# Patient Record
Sex: Female | Born: 1990 | Hispanic: Yes | Marital: Single | Smoking: Never smoker
Health system: Southern US, Community
[De-identification: ages and names within clinical notes are randomized; demographics above are authoritative.]

## PROBLEM LIST (undated history)

## (undated) DIAGNOSIS — B9689 Other specified bacterial agents as the cause of diseases classified elsewhere: Secondary | ICD-10-CM

## (undated) DIAGNOSIS — N760 Acute vaginitis: Secondary | ICD-10-CM

## (undated) DIAGNOSIS — R112 Nausea with vomiting, unspecified: Secondary | ICD-10-CM

## (undated) DIAGNOSIS — N390 Urinary tract infection, site not specified: Secondary | ICD-10-CM

## (undated) DIAGNOSIS — Z789 Other specified health status: Secondary | ICD-10-CM

## (undated) DIAGNOSIS — B3731 Acute candidiasis of vulva and vagina: Secondary | ICD-10-CM

## (undated) DIAGNOSIS — B373 Candidiasis of vulva and vagina: Secondary | ICD-10-CM

## (undated) DIAGNOSIS — Z9889 Other specified postprocedural states: Secondary | ICD-10-CM

## (undated) HISTORY — DX: Other specified postprocedural states: Z98.890

## (undated) HISTORY — DX: Candidiasis of vulva and vagina: B37.3

## (undated) HISTORY — DX: Other specified bacterial agents as the cause of diseases classified elsewhere: N76.0

## (undated) HISTORY — PX: OTHER SURGICAL HISTORY: SHX169

## (undated) HISTORY — DX: Acute candidiasis of vulva and vagina: B37.31

## (undated) HISTORY — DX: Other specified bacterial agents as the cause of diseases classified elsewhere: B96.89

## (undated) HISTORY — DX: Other specified postprocedural states: R11.2

## (undated) HISTORY — DX: Urinary tract infection, site not specified: N39.0

## (undated) HISTORY — DX: Other specified health status: Z78.9

## (undated) HISTORY — PX: NO PAST SURGERIES: SHX2092

---

## 2019-06-24 NOTE — L&D Delivery Note (Addendum)
Patient is 29 y.o. G2P1001 [redacted]w[redacted]d admitted for elective IOL.   Delivery Note At 7:52 PM a viable female was delivered via Vaginal, Spontaneous (Presentation: Right Occiput Anterior).  APGAR: 8, 8; weight  .   Placenta status: Spontaneous, Intact.  Cord: 3 vessels  Anesthesia: Local Episiotomy: None Lacerations: Perineal;2nd degree Suture Repair: 3.0 vicryl Est. Blood Loss (mL): 225  Mom to postpartum.  Baby to Couplet care / Skin to Skin.  Upon arrival patient was complete and pushing. She pushed with good maternal effort to deliver a healthy baby girl. Baby delivered without difficulty, was noted to have good tone and place on maternal abdomen for oral suctioning, drying and stimulation. Delayed cord clamping performed. Placenta delivered intact with 3V cord. Vaginal canal and perineum was inspected and found to have a hemostatic periurethral laceration and a small second degree perineal laceration that was repaired with a figure of 8 with a 3-0 vycryl. Pitocin was started and uterus massaged until bleeding slowed. Counts of sharps, instruments, and lap pads were all correct.     Mirian Mo, MD PGY-3 8/19/20218:26 PM  I was gloved and present for the delivery of baby and the placenta. I also was present for Dr. Cardell Peach repair of 2nd degree laceration.  Lynnda Shields, MD OB Fellow, Faculty [facility].

## 2019-07-31 ENCOUNTER — Other Ambulatory Visit: Payer: Self-pay

## 2019-07-31 ENCOUNTER — Emergency Department (HOSPITAL_COMMUNITY)
Admission: EM | Admit: 2019-07-31 | Discharge: 2019-07-31 | Disposition: A | Discharge status: Home / Self Care | Payer: Medicaid Other | Attending: Emergency Medicine | Admitting: Emergency Medicine

## 2019-07-31 ENCOUNTER — Encounter (HOSPITAL_COMMUNITY): Payer: Self-pay | Admitting: Emergency Medicine

## 2019-07-31 DIAGNOSIS — O219 Vomiting of pregnancy, unspecified: Secondary | ICD-10-CM | POA: Diagnosis not present

## 2019-07-31 DIAGNOSIS — Z3A00 Weeks of gestation of pregnancy not specified: Secondary | ICD-10-CM | POA: Diagnosis not present

## 2019-07-31 DIAGNOSIS — O210 Mild hyperemesis gravidarum: Secondary | ICD-10-CM | POA: Insufficient documentation

## 2019-07-31 DIAGNOSIS — Z3A01 Less than 8 weeks gestation of pregnancy: Secondary | ICD-10-CM | POA: Diagnosis not present

## 2019-07-31 LAB — URINALYSIS, ROUTINE W REFLEX MICROSCOPIC
Bacteria, UA: NONE SEEN
Bilirubin Urine: NEGATIVE
Glucose, UA: 50 mg/dL — AB
Hgb urine dipstick: NEGATIVE
Ketones, ur: 80 mg/dL — AB
Leukocytes,Ua: NEGATIVE
Nitrite: NEGATIVE
Protein, ur: 100 mg/dL — AB
Specific Gravity, Urine: 1.03 (ref 1.005–1.030)
pH: 6 (ref 5.0–8.0)

## 2019-07-31 LAB — I-STAT CHEM 8, ED
BUN: 8 mg/dL (ref 6–20)
Calcium, Ion: 1.21 mmol/L (ref 1.15–1.40)
Chloride: 104 mmol/L (ref 98–111)
Creatinine, Ser: 0.4 mg/dL — ABNORMAL LOW (ref 0.44–1.00)
Glucose, Bld: 136 mg/dL — ABNORMAL HIGH (ref 70–99)
HCT: 40 % (ref 36.0–46.0)
Hemoglobin: 13.6 g/dL (ref 12.0–15.0)
Potassium: 3.2 mmol/L — ABNORMAL LOW (ref 3.5–5.1)
Sodium: 138 mmol/L (ref 135–145)
TCO2: 21 mmol/L — ABNORMAL LOW (ref 22–32)

## 2019-07-31 LAB — I-STAT BETA HCG BLOOD, ED (MC, WL, AP ONLY): I-stat hCG, quantitative: >2000 m[IU]/mL — ABNORMAL HIGH (ref <5)

## 2019-07-31 MED ORDER — PROMETHAZINE HCL 25 MG/ML IJ SOLN
12.5000 mg | Freq: Once | INTRAMUSCULAR | Status: AC
  Administered 2019-07-31: 12.5 mg via INTRAVENOUS
  Filled 2019-07-31: qty 1

## 2019-07-31 MED ORDER — ONDANSETRON 8 MG PO TBDP
8.0000 mg | ORAL_TABLET | Freq: Once | ORAL | Status: AC
  Administered 2019-07-31: 8 mg via ORAL
  Filled 2019-07-31: qty 1

## 2019-07-31 MED ORDER — ALUM & MAG HYDROXIDE-SIMETH 200-200-20 MG/5ML PO SUSP
30.0000 mL | Freq: Once | ORAL | Status: AC
  Administered 2019-07-31: 30 mL via ORAL
  Filled 2019-07-31: qty 30

## 2019-07-31 MED ORDER — SODIUM CHLORIDE 0.9 % IV BOLUS
500.0000 mL | Freq: Once | INTRAVENOUS | Status: AC
  Administered 2019-07-31: 500 mL via INTRAVENOUS

## 2019-07-31 MED ORDER — ACETAMINOPHEN 500 MG PO TABS
1000.0000 mg | ORAL_TABLET | Freq: Once | ORAL | Status: AC
  Administered 2019-07-31: 06:00:00 1000 mg via ORAL
  Filled 2019-07-31: qty 2

## 2019-07-31 MED ORDER — ONDANSETRON 4 MG PO TBDP: ORAL_TABLET | ORAL | 0 refills | Status: DC

## 2019-07-31 NOTE — ED Notes (Signed)
Asked patient if she was able to give some urine now since she wasn't able to get any urine again the second time went to restroom. pt states that this RN got urine that she left on the table. THis RN informed pt that I have not seen her since walked her to the restroom the second time, as I have not seen her because I had been  with another patient the last hour to take any urine from her or the table were she states she left it.Informed pt that Dr Nicanor Alcon informed me that you weren't able to get any the last time you went to the restroom. Pt states that she did and this RN got it off the table.  This RN informed patient that I will not sit here and continue to disagree with her and left the room.

## 2019-07-31 NOTE — ED Notes (Signed)
Pt states that she missed the cup and wasn't able to get urine specimen.

## 2019-07-31 NOTE — ED Provider Notes (Signed)
   Patient's UA was without evidence of significant acute pathology.  Patient is taking good p.o.  Patient is appropriate for discharge.  Patient does understand need for close follow-up.  Patient will be prescribed Zofran for treatment of nausea.   Wynetta Fines, MD 07/31/19 [phone]

## 2019-07-31 NOTE — ED Provider Notes (Signed)
[facility] DEPT Provider Note   CSN: [id number] Arrival date & time: 07/31/19  0400     History Chief Complaint  Patient presents with  . Emesis    Megan Rocha is a 29 y.o. female.   Emesis Associated symptoms: no abdominal pain, no arthralgias, no diarrhea and no fever        History reviewed. No pertinent past medical history.  There are no problems to display for this patient.   History reviewed. No pertinent surgical history.   OB History    Gravida  3   Para  1   Term  1   Preterm      AB      Living  1     SAB      TAB      Ectopic      Multiple      Live Births              History reviewed. No pertinent family history.  Social History   Tobacco Use  . Smoking status: Never Smoker  . Smokeless tobacco: Never Used  Substance Use Topics  . Alcohol use: Never  . Drug use: Never    Home Medications Prior to Admission medications   Not on File    Allergies    Penicillins  Review of Systems   Review of Systems  Constitutional: Negative for fever.  HENT: Negative for congestion.   Eyes: Negative for visual disturbance.  Respiratory: Negative for shortness of breath.   Cardiovascular: Negative for chest pain.  Gastrointestinal: Positive for nausea and vomiting. Negative for abdominal pain and diarrhea.  Genitourinary: Negative for vaginal bleeding and vaginal discharge.  Musculoskeletal: Negative for arthralgias.  Neurological: Negative for dizziness.  Psychiatric/Behavioral: Negative for agitation.  All other systems reviewed and are negative.   Physical Exam Updated Vital Signs BP 106/73 (BP Location: Left Arm)   Pulse 72   Temp 98.4 F (36.9 C) (Axillary)   Resp 18   Ht 5\' 10"  (1.778 m)   Wt 74.8 kg   LMP  (LMP Unknown)   SpO2 100%   BMI 23.68 kg/m   Physical Exam Vitals and nursing note reviewed.  Constitutional:      General: She is not in acute distress.    Appearance:  Normal appearance.  HENT:     Head: Normocephalic and atraumatic.     Nose: Nose normal.  Eyes:     Conjunctiva/sclera: Conjunctivae normal.     Pupils: Pupils are equal, round, and reactive to light.  Cardiovascular:     Rate and Rhythm: Normal rate and regular rhythm.     Pulses: Normal pulses.     Heart sounds: Normal heart sounds.  Pulmonary:     Effort: Pulmonary effort is normal.     Breath sounds: Normal breath sounds.  Abdominal:     General: Abdomen is flat. Bowel sounds are normal.     Palpations: There is no mass.     Tenderness: There is no abdominal tenderness. There is no guarding or rebound.     Hernia: No hernia is present.  Musculoskeletal:        General: Normal range of motion.     Cervical back: Normal range of motion and neck supple.  Skin:    General: Skin is warm and dry.     Capillary Refill: Capillary refill takes less than 2 seconds.  Neurological:     General: No focal deficit present.  Mental Status: She is alert and oriented to person, place, and time.     Deep Tendon Reflexes: Reflexes normal.  Psychiatric:        Mood and Affect: Mood normal.        Behavior: Behavior normal.     ED Results / Procedures / Treatments   Labs (all labs ordered are listed, but only abnormal results are displayed) Results for orders placed or performed during the hospital encounter of 07/31/19  I-Stat Beta hCG blood, ED ([facility], [facility], [facility] only)  Result Value Ref Range   I-stat hCG, quantitative >2,000.0 (H) <5 mIU/mL   Comment 3          I-stat chem 8, ED (not at [facility] or [facility])  Result Value Ref Range   Sodium 138 135 - 145 mmol/L   Potassium 3.2 (L) 3.5 - 5.1 mmol/L   Chloride 104 98 - 111 mmol/L   BUN 8 6 - 20 mg/dL   Creatinine, Ser 6.96 (L) 0.44 - 1.00 mg/dL   Glucose, Bld 789 (H) 70 - 99 mg/dL   Calcium, Ion 3.81 0.17 - 1.40 mmol/L   TCO2 21 (L) 22 - 32 mmol/L   Hemoglobin 13.6 12.0 - 15.0 g/dL   HCT 51.0 25.8 - 52.7 %   No results  found.  Radiology No results found.  Procedures Procedures (including critical care time)  Medications Ordered in ED Medications  acetaminophen (TYLENOL) tablet 1,000 mg (has no administration in time range)  sodium chloride 0.9 % bolus 500 mL (has no administration in time range)  alum & mag hydroxide-simeth (MAALOX/MYLANTA) 200-200-20 MG/5ML suspension 30 mL (has no administration in time range)  promethazine (PHENERGAN) injection 12.5 mg (has no administration in time range)  ondansetron (ZOFRAN-ODT) disintegrating tablet 8 mg (8 mg Oral Given 07/31/19 0507)    ED Course  I have reviewed the triage vital signs and the nursing notes.  Pertinent labs & imaging results that were available during my care of the patient were reviewed by me and considered in my medical decision making (see chart for details).   Given zofran, no further emesis. Patient is retching but is is also hyperventilating.    Given phenergan and vomiting has ceased.  Patient PO challenged in the ED successfully  Final Clinical Impression(s) / ED Diagnoses Signed out to Dr. Rodena Medin pending urinalysis.     Tekoa Hamor, MD 07/31/19 7824

## 2019-07-31 NOTE — ED Triage Notes (Signed)
Pt reports she doesn't have an OB doctor that she sees.

## 2019-07-31 NOTE — Discharge Instructions (Addendum)
Please return for any problem.  Follow-up with your regular care provider as instructed.  Follow-up with OB as instructed.

## 2019-07-31 NOTE — ED Triage Notes (Signed)
Pt presents with increasing vomiting. Pt reports being appx 3 months pregnant and has had confirmed ultrasound of pregnancy. Possible due date of Aug 2021

## 2019-07-31 NOTE — ED Notes (Signed)
Called lab to make sure could run quantitative pregnancy off blood already in lab.

## 2019-08-10 ENCOUNTER — Telehealth: Payer: Self-pay | Admitting: *Deleted

## 2019-08-10 ENCOUNTER — Other Ambulatory Visit: Payer: Self-pay

## 2019-08-10 ENCOUNTER — Ambulatory Visit (INDEPENDENT_AMBULATORY_CARE_PROVIDER_SITE_OTHER): Payer: Self-pay | Admitting: *Deleted

## 2019-08-10 DIAGNOSIS — Z3490 Encounter for supervision of normal pregnancy, unspecified, unspecified trimester: Secondary | ICD-10-CM | POA: Insufficient documentation

## 2019-08-10 NOTE — Telephone Encounter (Signed)
I called [facility] and asked for them to send [facility] Prenatal [place] . They confirmed they will call her to get her permission and then fax it to [place]. Lavergne Hiltunen,RN

## 2019-08-10 NOTE — Patient Instructions (Signed)

## 2019-08-10 NOTE — Progress Notes (Addendum)
I connected with  Megan Rocha on 08/10/19 at  2:30 PM EST by telephone and verified that I am speaking with the correct person using two identifiers.   I discussed the limitations, risks, security and privacy concerns of performing an evaluation and management service by telephone and the availability of in person appointments. I also discussed with the patient that there may be a patient responsible charge related to this service. The patient expressed understanding and agreed to proceed.  I explained I am completing her New OB Intake today. We discussed Her EDD and that it is based on  sure LMP . She does report she [facility] . I explained we will need a copy of the [place] to confirm her dating. I explained I will call them but they will need to call her also to confirm she gives permission to send [place] the [place].  I reviewed her allergies, meds, OB History, Medical /Surgical history, and appropriate screenings. I informed her of [facility] services.  I explained I will send her the [employer] app and app was sent to her while on phone.  I explained we will send a blood pressure cuff to [facility]  Once she has active medicaid.  Explained  then we will have her take her blood pressure weekly and enter into the app. I explained she will have some visits in office and some virtually. I sent her a MyChart text but it did not go thru while we were on the phone. I asked her to bring her smart phone with her to her first in office visit and we will help her with MyChart.  I reviewed her new ob  appointment date/ time with her , our location and to wear mask, no visitors.  I explained she will have a pelvic exam, ob bloodwork, hemoglobin a1C, cbg ,pap, and  genetic testing if desired,- she does want a panorama. I scheduled an [place] at 19 weeks and gave her the appointment. She voices understanding.    [place] Mullett,RN 08/10/2019  2:31 PM

## 2019-08-17 ENCOUNTER — Ambulatory Visit (INDEPENDENT_AMBULATORY_CARE_PROVIDER_SITE_OTHER): Payer: Self-pay | Admitting: Women's Health

## 2019-08-17 ENCOUNTER — Other Ambulatory Visit (HOSPITAL_COMMUNITY)
Admission: RE | Admit: 2019-08-17 | Discharge: 2019-08-17 | Disposition: A | Discharge status: Home / Self Care | Payer: Medicaid Other | Source: Ambulatory Visit | Attending: Women's Health | Admitting: Women's Health

## 2019-08-17 VITALS — BP 111/62 | HR 75 | Wt 155.0 lb

## 2019-08-17 DIAGNOSIS — Z3490 Encounter for supervision of normal pregnancy, unspecified, unspecified trimester: Secondary | ICD-10-CM

## 2019-08-17 DIAGNOSIS — O219 Vomiting of pregnancy, unspecified: Secondary | ICD-10-CM

## 2019-08-17 DIAGNOSIS — R454 Irritability and anger: Secondary | ICD-10-CM

## 2019-08-17 DIAGNOSIS — Z3A15 15 weeks gestation of pregnancy: Secondary | ICD-10-CM

## 2019-08-17 MED ORDER — ONDANSETRON 8 MG PO TBDP: 8.0000 mg | ORAL_TABLET | Freq: Three times a day (TID) | ORAL | 1 refills | Status: DC | PRN

## 2019-08-17 NOTE — Progress Notes (Signed)
History:   Megan Rocha is a 29 y.o. G2P1001 at [redacted]w[redacted]d by LMP being seen today for her first obstetrical visit.  Her obstetrical history is significant for none. Patient is unsure if she intend to breast feed. Pregnancy history fully reviewed.  Pt reports this is a desired and planned pregnancy. Allergies: PCN Current Medications: Zofran, PNVs PMH: No HTN, DM, asthma. PSH: no OB Hx: #1: NVSD Social Hx: pt does not smoke, drink. Pt reports she last used marijuana about 2-3 weeks ago. Family Hx: remote cousin with Down Syndrome Pt declines flu vaccine.  Patient reports nausea and vomiting.      HISTORY: OB History  Gravida Para Term Preterm AB Living  2 1 1  0 0 1  SAB TAB Ectopic Multiple Live Births  0 0 0 0 1    # Outcome Date GA Lbr Len/2nd Weight Sex Delivery Anes PTL Lv  2 Current           1 Term 2015 [redacted]w[redacted]d  7 lb 6 oz (3.345 kg) M Vag-Spont EPI  LIV     Birth Comments: wnl    Last pap smear was done 2015 and was normal  Past Medical History:  Diagnosis Date  . BV (bacterial vaginosis)   . PONV (postoperative nausea and vomiting)   . UTI (urinary tract infection)   . Yeast vaginitis    No past surgical history on file. Family History  Problem Relation Age of Onset  . Breast cancer Paternal Grandmother    Social History   Tobacco Use  . Smoking status: Never Smoker  . Smokeless tobacco: Never Used  Substance Use Topics  . Alcohol use: Not Currently    Comment: occasional   . 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 Penicillins Anaphylaxis   Current Outpatient Medications on File Prior to Visit  Medication Sig Dispense Refill  . Prenatal Vit-Fe Fumarate-FA (PRENATAL VITAMINS PO) Take 1 tablet by mouth daily.     No current facility-administered medications on file prior to visit.    Review of Systems Pertinent items noted in HPI and remainder of comprehensive ROS  otherwise negative. Physical Exam:   Vitals:   08/17/19 1328  BP: 111/62  Pulse: 75  Weight: 155 lb (70.3 kg)   Fetal Heart Rate (bpm): 159 Uterus:    c/w 15wk size  Pelvic Exam: Perineum: no hemorrhoids, normal perineum   Vulva: normal external genitalia, no lesions   Vagina:  normal mucosa, normal discharge   Cervix: no lesions and normal, pap smear done.    Adnexa: normal adnexa and no mass, fullness, tenderness   Bony Pelvis: average  System: General: well-developed, well-nourished female in no acute distress   Breasts:  Declined   Skin: normal coloration and turgor, no rashes   Neurologic: oriented, normal, negative, normal mood   Extremities: normal strength, tone, and muscle mass, ROM of all joints is normal   HEENT PERRLA, extraocular movement intact and sclera clear, anicteric   Mouth/Teeth mucous membranes moist, pharynx normal without lesions and dental hygiene good   Neck supple and no masses   Cardiovascular: regular rate and rhythm   Respiratory:  no respiratory distress, normal breath sounds   Abdomen: soft, non-tender; bowel sounds normal; no masses,  no organomegaly     Assessment:    Pregnancy: G2P1001 Patient Active Problem List   Diagnosis Date Noted  .  Supervision of low-risk pregnancy 08/10/2019     Plan:     1. Encounter for supervision of low-risk pregnancy, antepartum - Obstetric Panel, Including HIV - Hemoglobin A1c - Culture, OB Urine - Cytology - PAP( Gap) - AFP, Serum, Open Spina Bifida  2. Nausea and vomiting during pregnancy - recommended Vit B6 - ondansetron (ZOFRAN ODT) 8 MG disintegrating tablet; Take 1 tablet (8 mg total) by mouth every 8 (eight) hours as needed for nausea or vomiting.  Dispense: 30 tablet; Refill: 1  3. Difficulty controlling anger - pt reports no official mental health diagnoses in the past - Ambulatory referral to [facility]  Initial labs drawn. Continue prenatal  vitamins. Genetic Screening discussed, First trimester screen, Quad screen and NIPS: declinedn. Ultrasound discussed; fetal anatomic survey: ordered. Problem list reviewed and updated. The nature of [place] with multiple MDs and other Advanced Practice Providers was explained to patient; also emphasized that residents, students are part of our team. Routine obstetric precautions reviewed. Return in about 4 weeks (around 09/14/2019) for virtual LOB.   Clarisa Fling, NP  2:04 PM 08/17/2019

## 2019-08-17 NOTE — Patient Instructions (Addendum)
Safe Medications in Pregnancy    Acne: Benzoyl Peroxide Salicylic Acid  Backache/Headache: Tylenol: 2 regular strength every 4 hours OR              2 Extra strength every 6 hours  Colds/Coughs/Allergies: Benadryl (alcohol free) 25 mg every 6 hours as needed Breath right strips Claritin Cepacol throat lozenges Chloraseptic throat spray Cold-Eeze- up to three times per day Cough drops, alcohol free Flonase (by prescription only) Guaifenesin Mucinex Robitussin DM (plain only, alcohol free) Saline nasal spray/drops Sudafed (pseudoephedrine) & Actifed ** use only after [redacted] weeks gestation and if you do not have high blood pressure Tylenol Vicks Vaporub Zinc lozenges Zyrtec   Constipation: Colace Ducolax suppositories Fleet enema Glycerin suppositories Metamucil Milk of magnesia Miralax Senokot Smooth move tea  Diarrhea: Kaopectate Imodium A-D  *NO pepto Bismol  Hemorrhoids: Anusol Anusol HC Preparation H Tucks  Indigestion: Tums Maalox Mylanta Zantac  Pepcid  Insomnia: Benadryl (alcohol free) 25mg  every 6 hours as needed Tylenol PM Unisom, no Gelcaps  Leg Cramps: Tums MagGel  Nausea/Vomiting:  Bonine Dramamine Emetrol Ginger extract Sea bands Meclizine  Nausea medication to take during pregnancy:  Unisom (doxylamine succinate 25 mg tablets) Take one tablet daily at bedtime. If symptoms are not adequately controlled, the dose can be increased to a maximum recommended dose of two tablets daily (1/2 tablet in the morning, 1/2 tablet mid-afternoon and one at bedtime). Vitamin B6 100mg  tablets. Take one tablet twice a day (up to 200 mg per day).  Skin Rashes: Aveeno products Benadryl cream or 25mg  every 6 hours as needed Calamine Lotion 1% cortisone cream  Yeast infection: Gyne-lotrimin 7 Monistat 7   **If taking multiple medications, please check labels to avoid duplicating the same active ingredients **take  medication as directed on the label ** Do not exceed 4000 mg of tylenol in 24 hours **Do not take medications that contain aspirin or ibuprofen     Maternity Assessment Unit (MAU)  The Maternity Assessment Unit (MAU) is located at the [facility]. The address is: [address], [place], [place], [address] [place]. Please see map below for additional directions.    The Maternity Assessment Unit is designed to help you during your pregnancy, and for up to 6 weeks after delivery, with any pregnancy- or postpartum-related emergencies, if you think you are in labor, or if your water has broken. For example, if you experience nausea and vomiting, vaginal bleeding, severe abdominal or pelvic pain, elevated blood pressure or other problems related to your pregnancy or postpartum time, please come to the Maternity Assessment Unit for assistance.  Second Trimester of Pregnancy The second trimester is from week 14 through week 27 (months 4 through 6). The second trimester is often a time when you feel your best. Your body has adjusted to being pregnant, and you begin to feel better physically. Usually, morning sickness has lessened or quit completely, you may have more energy, and you may have an increase in appetite. The second trimester is also a time when the fetus is growing rapidly. At the end of the sixth month, the fetus is about 9 inches long and weighs about 1 pounds. You will likely begin to feel the baby move (quickening) between 16 and 20 weeks of pregnancy. Body changes during your second trimester Your body continues to go through many changes during your second trimester. The changes vary from woman to woman.  Your weight will continue to increase.  You will notice your lower abdomen bulging out.  You may begin to get stretch marks on your hips, abdomen, and breasts.  You may develop headaches that can be relieved by medicines. The  medicines should be approved by your health care provider.  You may urinate more often because the fetus is pressing on your bladder.  You may develop or continue to have heartburn as a result of your pregnancy.  You may develop constipation because certain hormones are causing the muscles that push waste through your intestines to slow down.  You may develop hemorrhoids or swollen, bulging veins (varicose veins).  You may have back pain. This is caused by: ? Weight gain. ? Pregnancy hormones that are relaxing the joints in your pelvis. ? A shift in weight and the muscles that support your balance.  Your breasts will continue to grow and they will continue to become tender.  Your gums may bleed and may be sensitive to brushing and flossing.  Dark spots or blotches (chloasma, mask of pregnancy) may develop on your face. This will likely fade after the baby is born.  A dark line from your belly button to the pubic area (linea nigra) may appear. This will likely fade after the baby is born.  You may have changes in your hair. These can include thickening of your hair, rapid growth, and changes in texture. Some women also have hair loss during or after pregnancy, or hair that feels dry or thin. Your hair will most likely return to normal after your baby is born. What to expect at prenatal visits During a routine prenatal visit:  You will be weighed to make sure you and the fetus are growing normally.  Your blood pressure will be taken.  Your abdomen will be measured to track your baby's growth.  The fetal heartbeat will be listened to.  Any test results from the previous visit will be discussed. Your health care provider may ask you:  How you are feeling.  If you are feeling the baby move.  If you have had any abnormal symptoms, such as leaking fluid, bleeding, severe headaches, or abdominal cramping.  If you are using any tobacco products, including cigarettes, chewing  tobacco, and electronic cigarettes.  If you have any questions. Other tests that may be performed during your second trimester include:  Blood tests that check for: ? Low iron levels (anemia). ? High blood sugar that affects pregnant women (gestational diabetes) between 51 and 28 weeks. ? Rh antibodies. This is to check for a protein on red blood cells (Rh factor).  Urine tests to check for infections, diabetes, or protein in the urine.  An ultrasound to confirm the proper growth and development of the baby.  An amniocentesis to check for possible genetic problems.  Fetal screens for spina bifida and Down syndrome.  HIV (human immunodeficiency virus) testing. Routine prenatal testing includes screening for HIV, unless you choose not to have this test. Follow these instructions at home: Medicines  Follow your health care provider's instructions regarding medicine use. Specific medicines may be either safe or unsafe to take during pregnancy.  Take a prenatal vitamin that contains at least 600 micrograms (mcg) of folic acid.  If you develop constipation, try taking a stool softener if your health care provider approves. Eating and drinking   Eat a balanced diet that includes fresh fruits and vegetables, whole grains, good sources of protein such as meat, eggs, or tofu, and low-fat dairy. Your health care  provider will help you determine the amount of weight gain that is right for you.  Avoid raw meat and uncooked cheese. These carry germs that can cause birth defects in the baby.  If you have low calcium intake from food, talk to your health care provider about whether you should take a daily calcium supplement.  Limit foods that are high in fat and processed sugars, such as fried and sweet foods.  To prevent constipation: ? Drink enough fluid to keep your urine clear or pale yellow. ? Eat foods that are high in fiber, such as fresh fruits and vegetables, whole grains, and  beans. Activity  Exercise only as directed by your health care provider. Most women can continue their usual exercise routine during pregnancy. Try to exercise for 30 minutes at least 5 days a week. Stop exercising if you experience uterine contractions.  Avoid heavy lifting, wear low heel shoes, and practice good posture.  A sexual relationship may be continued unless your health care provider directs you otherwise. Relieving pain and discomfort  Wear a good support bra to prevent discomfort from breast tenderness.  Take warm sitz baths to soothe any pain or discomfort caused by hemorrhoids. Use hemorrhoid cream if your health care provider approves.  Rest with your legs elevated if you have leg cramps or low back pain.  If you develop varicose veins, wear support hose. Elevate your feet for 15 minutes, 3-4 times a day. Limit salt in your diet. Prenatal Care  Write down your questions. Take them to your prenatal visits.  Keep all your prenatal visits as told by your health care provider. This is important. Safety  Wear your seat belt at all times when driving.  Make a list of emergency phone numbers, including numbers for family, friends, the hospital, and police and fire departments. General instructions  Ask your health care provider for a referral to a local prenatal education class. Begin classes no later than the beginning of month 6 of your pregnancy.  Ask for help if you have counseling or nutritional needs during pregnancy. Your health care provider can offer advice or refer you to specialists for help with various needs.  Do not use hot tubs, steam rooms, or saunas.  Do not douche or use tampons or scented sanitary pads.  Do not cross your legs for long periods of time.  Avoid cat litter boxes and soil used by cats. These carry germs that can cause birth defects in the baby and possibly loss of the fetus by miscarriage or stillbirth.  Avoid all smoking, herbs,  alcohol, and unprescribed drugs. Chemicals in these products can affect the formation and growth of the baby.  Do not use any products that contain nicotine or tobacco, such as cigarettes and e-cigarettes. If you need help quitting, ask your health care provider.  Visit your dentist if you have not gone yet during your pregnancy. Use a soft toothbrush to brush your teeth and be gentle when you floss. Contact a health care provider if:  You have dizziness.  You have mild pelvic cramps, pelvic pressure, or nagging pain in the abdominal area.  You have persistent nausea, vomiting, or diarrhea.  You have a bad smelling vaginal discharge.  You have pain when you urinate. Get help right away if:  You have a fever.  You are leaking fluid from your vagina.  You have spotting or bleeding from your vagina.  You have severe abdominal cramping or pain.  You have rapid  weight gain or weight loss.  You have shortness of breath with chest pain.  You notice sudden or extreme swelling of your face, hands, ankles, feet, or legs.  You have not felt your baby move in over an hour.  You have severe headaches that do not go away when you take medicine.  You have vision changes. Summary  The second trimester is from week 14 through week 27 (months 4 through 6). It is also a time when the fetus is growing rapidly.  Your body goes through many changes during pregnancy. The changes vary from woman to woman.  Avoid all smoking, herbs, alcohol, and unprescribed drugs. These chemicals affect the formation and growth your baby.  Do not use any tobacco products, such as cigarettes, chewing tobacco, and e-cigarettes. If you need help quitting, ask your health care provider.  Contact your health care provider if you have any questions. Keep all prenatal visits as told by your health care provider. This is important. This information is not intended to replace advice given to you by your health care  provider. Make sure you discuss any questions you have with your health care provider. Document Revised: 10/01/2018 Document Reviewed: 07/15/2016 Elsevier Patient Education  [place].   Pregnancy and Influenza  Influenza, also called the flu, is an infection of the lungs and airways (respiratory tract). If you are pregnant, you are more likely to catch the flu. You are also more likely to have a more serious case of the flu. This is because pregnancy causes changes to your body's disease-fighting system (immune system), heart, and lungs. If you develop a bad case of the flu, especially with a high fever, this can cause problems for you and your developing baby. How do people get the flu? The flu is caused by a type of germ called a virus. It spreads when virus particles get passed from person to person by:  Being near a sick person who is coughing or sneezing.  Touching something that has the virus on it and then touching your mouth, nose, or face. The influenza virus is most common during the fall and winter. How can I protect myself against the flu?  Get a flu shot. The best way to prevent the flu is to get a flu shot before flu season starts. The flu shot is not dangerous for your developing baby. It may even help protect your baby from the flu for up to 6 months after birth.  Wash your hands often with soap and warm water. If soap and water are not available, use hand sanitizer.  Do not come in close contact with sick people.  Do not share food, drinks, or utensils with other people.  Avoid touching your eyes, nose, and mouth.  Clean frequently used surfaces at home, school, or work.  Practice healthy lifestyle habits, such as: ? Eating a healthy, balanced diet. ? Drinking plenty of fluids. ? Exercising regularly or as told by your health care provider. ? Sleeping 7-9 hours each night. ? Finding ways to manage stress. What should I do if I have flu symptoms?  If you  have any symptoms of the flu, even after getting a flu shot, contact your health care provider right away.  To reduce fever, take over-the-counter acetaminophen as told by your health care provider.  If you have the flu, you may get antiviral medicine to keep the flu from becoming severe and to shorten how long it lasts.  Avoid spreading  the flu to others: ? Stay home until you are well. ? Cover your nose and mouth when you cough or sneeze. ? Wash your hands often. Follow these instructions at home:  Take over-the-counter and prescription medicines only as told by your health care provider. Do not take any medicine, including cold or flu medicine, unless your health care provider tells you to do so.  If you were prescribed antiviral medicine, take it as told by your health care provider. Do not stop taking the antiviral medicine even if you start to feel better.  Eat a nutrient-rich diet that includes fresh fruits and vegetables, whole grains, lean protein, and low-fat dairy.  Drink enough fluid to keep your urine clear or pale yellow.  Get plenty of rest. Contact a health care provider if:  You have fever or chills.  You have a cough, sore throat, or stuffy nose.  You have worsening or unusual: ? Muscle aches. ? Headache. ? Tiredness. ? Loss of appetite.  You have vomiting or diarrhea. Get help right away if:  You have trouble breathing.  You have chest pain.  You have abdominal pain.  You begin to have labor pains.  You have a fever that does not go down 24 hours after you take medicine.  You do not feel your baby move.  You have diarrhea or vomiting that will not go away.  You have dizziness or confusion.  Your symptoms do not improve, even with treatment. Summary  If you are pregnant, you are more likely to catch the flu. You are also more likely to have a more serious case of the flu.  If you have flu-like symptoms, call your health care provider right  away. If you develop a bad case of the flu, especially with a high fever, this can be dangerous for your developing baby.  The best way to prevent the flu is to get a flu shot before flu season starts. The flu shot is not dangerous for your developing baby.  If you have the flu and were prescribed antiviral medicine, take it as told by your health care provider. This information is not intended to replace advice given to you by your health care provider. Make sure you discuss any questions you have with your health care provider. Document Revised: 10/01/2018 Document Reviewed: 08/05/2016 Elsevier Patient Education  2020 [employer].  Marijuana Use During Pregnancy and Breastfeeding  Marijuana is the dried leaves, flowers, and stems of the Cannabis sativa or Cannabis indica plant. The plant's active ingredients (cannabinoids), including a chemical called THC, change the chemistry of the brain. Marijuana smoke also has many of the same chemicals as cigarette smoke that cause breathing problems. Marijuana gets into your blood through your lungs when you smoke it and through your digestive system when you swallow it. Using marijuana in any form may be harmful for you and your baby when you are trying to become pregnant and during pregnancy. This includes marijuana that is prescribed to you by a health care provider (medical marijuana). Once marijuana is in your blood, it can travel through your placenta to your baby. It may also pass through breast milk. How does this affect me? Marijuana affects you both mentally and physically. Using marijuana can make you feel high and relaxed. It can also have negative effects, especially at high doses or with long-term use. These include:  Rapid heartbeat and stress on your heart.  Lung irritation and breathing problems.  Difficulty thinking and making  decisions.  Seeing or believing things that are not true (hallucinations and paranoia).  Mood swings,  depression, or anxiety.  Decreased ability to learn and remember.  Difficulty getting pregnant. Marijuana can also affect your pregnancy. Not all the effects are known. However, if you use marijuana during pregnancy, you may:  Be less likely to get regular prenatal care and do the things that you need to do to have a healthy pregnancy.  Be more likely to use other drugs that can harm your pregnancy, like drinking alcohol and smoking cigarettes.  Be at higher risk of having your baby die after 28 weeks of pregnancy (stillbirth).  Be at higher risk of giving birth before 37 weeks of pregnancy (premature birth). How does this affect my baby? If you use marijuana during pregnancy, this may affect your baby's development, birth, and life after birth. Your baby may:  Be born prematurely, which can cause physical and mental problems.  Be born with a low birth weight, which can lead to physical and mental problems.  Have problems with brain development.  Have difficulty growing.  Have attention and behavior problems later in life.  Do poorly at school and have learning problems later in life.  Have problems with vision and coordination.  Be at higher risk for using marijuana by age 27. More research is needed to find out exactly how marijuana affects a baby during breastfeeding. Some studies suggest that the chemicals in marijuana can be passed to a baby through breast milk. To limit possible risks, you should not use marijuana during breastfeeding. Follow these instructions at home:  Let your health care provider know if you use marijuana before trying to get pregnant, during pregnancy, or during breastfeeding.  Do not use marijuana in any form when you are trying to get pregnant, when you are pregnant, or when you are breastfeeding. If you are having trouble stopping marijuana use, ask your health care provider for help.  Do not smoke. If you need help quitting, ask your health care  provider for help.  If you are using medical marijuana, ask your health care provider to switch you to a medicine that is safer to use during pregnancy or breastfeeding.  Keep all your prenatal visits as told by your health care provider. This is important. Where to find more information [employer]: www.drugabuse.gov March of [employer]: www.marchofdimes.org/pregnancy Contact a health care provider if:  You use marijuana and want to get pregnant.  You use marijuana during pregnancy or breastfeeding.  You need help stopping marijuana use. Get help right away if:  Your baby is not gaining weight or growing as expected. Summary  Using marijuana in any form may be harmful for you and your baby when you are trying to become pregnant, during pregnancy, and during breastfeeding. This includes marijuana that is prescribed to you (medical marijuana).  Some studies suggest that marijuana may pass through breast milk and can affect your baby's brain development.  Talk to your health care provider if you use marijuana in any form while trying to get pregnant, during pregnancy, or while breastfeeding.  Ask your health care provider for help if you are not able to stop using marijuana. This information is not intended to replace advice given to you by your health care provider. Make sure you discuss any questions you have with your health care provider. Document Revised: 10/01/2018 Document Reviewed: 02/25/2017 Elsevier Patient Education  2020 [employer]

## 2019-08-18 LAB — OBSTETRIC PANEL, INCLUDING HIV
Antibody Screen: NEGATIVE
Basophils Absolute: 0 10*3/uL (ref 0.0–0.2)
Basos: 1 %
EOS (ABSOLUTE): 0.1 10*3/uL (ref 0.0–0.4)
Eos: 1 %
HIV Screen 4th Generation wRfx: NONREACTIVE
Hematocrit: 32.4 % — ABNORMAL LOW (ref 34.0–46.6)
Hemoglobin: 11.2 g/dL (ref 11.1–15.9)
Hepatitis B Surface Ag: NEGATIVE
Immature Grans (Abs): 0 10*3/uL (ref 0.0–0.1)
Immature Granulocytes: 0 %
Lymphocytes Absolute: 1.4 10*3/uL (ref 0.7–3.1)
Lymphs: 19 %
MCH: 30.5 pg (ref 26.6–33.0)
MCHC: 34.6 g/dL (ref 31.5–35.7)
MCV: 88 fL (ref 79–97)
Monocytes Absolute: 0.4 10*3/uL (ref 0.1–0.9)
Monocytes: 6 %
Neutrophils Absolute: 5.1 10*3/uL (ref 1.4–7.0)
Neutrophils: 73 %
Platelets: 166 10*3/uL (ref 150–450)
RBC: 3.67 x10E6/uL — ABNORMAL LOW (ref 3.77–5.28)
RDW: 12.4 % (ref 11.7–15.4)
RPR Ser Ql: NONREACTIVE
Rh Factor: POSITIVE
Rubella Antibodies, IGG: 1.08 index (ref >0.99)
WBC: 7 10*3/uL (ref 3.4–10.8)

## 2019-08-18 LAB — HEMOGLOBIN A1C
Est. average glucose Bld gHb Est-mCnc: 97 mg/dL
Hgb A1c MFr Bld: 5 % (ref 4.8–5.6)

## 2019-08-19 ENCOUNTER — Ambulatory Visit (INDEPENDENT_AMBULATORY_CARE_PROVIDER_SITE_OTHER)
Admission: RE | Admit: 2019-08-19 | Discharge: 2019-08-19 | Disposition: A | Discharge status: Home / Self Care | Payer: Medicaid Other | Source: Ambulatory Visit

## 2019-08-19 DIAGNOSIS — R42 Dizziness and giddiness: Secondary | ICD-10-CM | POA: Diagnosis not present

## 2019-08-19 LAB — AFP, SERUM, OPEN SPINA BIFIDA
AFP MoM: 1.03
AFP Value: 29.7 ng/mL
Gest. Age on Collection Date: 15.1 weeks
Maternal Age At EDD: 26.6 yr
OSBR Risk 1 IN: 10000
Test Results:: NEGATIVE
Weight: 155 [lb_av]

## 2019-08-19 NOTE — ED Provider Notes (Signed)
Virtual Visit via Video Note:  [name]  initiated request for Telemedicine visit with [facility] team. I connected with [name]  on 08/19/2019 at 12:14 PM  for a synchronized telemedicine visit using a video enabled HIPPA compliant telemedicine application. I verified that I am speaking with [name]  using two identifiers. [name], NP  was physically located in a [facility] Urgent care site and Kamryn Messineo was located at a different location.   The limitations of evaluation and management by telemedicine as well as the availability of in-person appointments were discussed. Patient was informed that she  may incur a bill ( including co-pay) for this virtual visit encounter. [name]  expressed understanding and gave verbal consent to proceed with virtual visit.     History of Present Illness:Megan Rocha  is a 29 y.o. female presents with episode of dizziness, lightheadedness, feeling faint this started this morning.  Started while she was getting her son ready to go to school.  Denies losing consciousness.  The symptoms have mostly resolved.  Felt better after she drank some juice.  Admits to not eating breakfast.  She has not been eating much due to nausea and upset stomach and pregnancy.  Denies any cough, fever, chest pain, heart palpitations or shortness of breath, abdominal pain, back pain, vaginal bleeding or discharge.  Past Medical History:  Diagnosis Date  . BV (bacterial vaginosis)   . PONV (postoperative nausea and vomiting)   . UTI (urinary tract infection)   . Yeast vaginitis     Allergies  Allergen Reactions  . Penicillins Anaphylaxis        Observations/Objective: VITALS: Per patient if applicable, see vitals. GENERAL: Alert, appears well and in no acute distress. HEENT: Atraumatic, conjunctiva clear, no obvious abnormalities on inspection of external nose and ears. NECK: Normal movements of the head and neck. CARDIOPULMONARY: No  increased WOB. Speaking in clear sentences. I:E ratio WNL.  MS: Moves all visible extremities without noticeable abnormality. PSYCH: Pleasant and cooperative, well-groomed. Speech normal rate and rhythm. Affect is appropriate. Insight and judgement are appropriate. Attention is focused, linear, and appropriate.  NEURO: CN grossly intact. Oriented as arrived to appointment on time with no prompting. Moves both UE equally.  SKIN: No obvious lesions, wounds, erythema, or cyanosis noted on face or hands.     Assessment and Plan: Feeling faint-most likely due to low blood sugar and not eating.  Patient symptoms have resolved and felt better after drinking juice. Recommended eat small meals to keep blood sugar up and stay hydrated   Follow Up Instructions: If episode happens again she will need to be seen in person either urgent care or ER.    I discussed the assessment and treatment plan with the patient. The patient was provided an opportunity to ask questions and all were answered. The patient agreed with the plan and demonstrated an understanding of the instructions.   The patient was advised to call back or seek an in-person evaluation if the symptoms worsen or if the condition fails to improve as anticipated.    [name], NP  08/19/2019 12:14 PM         [name], NP 08/22/19 [id number]

## 2019-08-19 NOTE — Discharge Instructions (Signed)
This episode that you had is most likely due to low blood sugar.  Make sure you are eating small meals to keep her blood sugars up. If this episode happens again you will need to go to the ER for evaluation.

## 2019-08-20 LAB — URINE CULTURE, OB REFLEX

## 2019-08-20 LAB — CULTURE, OB URINE

## 2019-08-22 ENCOUNTER — Telehealth: Payer: Self-pay | Admitting: *Deleted

## 2019-08-22 DIAGNOSIS — N390 Urinary tract infection, site not specified: Secondary | ICD-10-CM

## 2019-08-22 DIAGNOSIS — O2342 Unspecified infection of urinary tract in pregnancy, second trimester: Secondary | ICD-10-CM

## 2019-08-22 LAB — CYTOLOGY - PAP
Chlamydia: NEGATIVE
Comment: NEGATIVE
Comment: NEGATIVE
Comment: NORMAL
Diagnosis: UNDETERMINED — AB
High risk HPV: POSITIVE — AB
Neisseria Gonorrhea: NEGATIVE

## 2019-08-22 MED ORDER — NITROFURANTOIN MONOHYD MACRO 100 MG PO CAPS: 100.0000 mg | ORAL_CAPSULE | Freq: Two times a day (BID) | ORAL | 0 refills | Status: DC

## 2019-08-22 NOTE — Telephone Encounter (Signed)
-----   Message from Marylen Ponto, NP sent at 08/22/2019 11:47 AM EST ----- Patient found to have UTI on NOB labs. Please call to make her aware and send treatment per protocol.  Thank you, Joni Reining

## 2019-08-22 NOTE — Progress Notes (Signed)
Patient found to have UTI on NOB labs. Please call to make her aware and send treatment per protocol.  Thank you, Joni Reining

## 2019-08-22 NOTE — Telephone Encounter (Signed)
I called Megan Rocha and notified her of UTI and RX for macrobid sent in to pharmacy to take 1 tablet bid for 7 days. She voices understanding. Aniyah Nobis,RN

## 2019-08-22 NOTE — Progress Notes (Signed)
Hello - this patient had an abnormal pap and needs a colposcopy. Please schedule her around 20wks for this, unless our protocols dictate a different gestational age. Thank you, Joni Reining

## 2019-08-23 ENCOUNTER — Telehealth: Payer: Self-pay

## 2019-08-23 NOTE — Telephone Encounter (Signed)
Patient was called regarding abnormal pap smear & that provider wanted her to have a Colposcopy. Patient verbalized understanding.

## 2019-08-24 NOTE — BH Specialist Note (Signed)
error 

## 2019-08-25 ENCOUNTER — Ambulatory Visit: Payer: Medicaid Other | Admitting: Clinical

## 2019-08-29 NOTE — BH Specialist Note (Signed)
Integrated Behavioral Health via Telemedicine Video Visit  Patient and/or legal guardian verbally consented to [facility] services about presenting concerns and psychiatric consultation as appropriate.     08/29/2019 [name] [id number]  Number of Integrated Behavioral Health visits: 1 Session Start time: 9:50  Session End time: 10:55 Total time: 35  Referring Provider: Donia Ast, NP Type of Visit: Video Patient/Family location: [facility] Provider location: [facility] All persons participating in visit: Patient Megan Rocha and [facility]    Confirmed patient's address: Yes  Confirmed patient's phone number: Yes  Any changes to demographics: No   Confirmed patient's insurance: Yes  Any changes to patient's insurance: No   Discussed confidentiality: Yes   I connected with Meshelle Holness by a video enabled telemedicine application and verified that I am speaking with the correct person using two identifiers.     I discussed the limitations of evaluation and management by telemedicine and the availability of in person appointments.  I discussed that the purpose of this visit is to provide behavioral health care while limiting exposure to the novel coronavirus.   Discussed there is a possibility of technology failure and discussed alternative modes of communication if that failure occurs.  I discussed that engaging in this video visit, they consent to the provision of behavioral healthcare and the services will be billed under their insurance.  Patient and/or legal guardian expressed understanding and consented to video visit: Yes   PRESENTING CONCERNS: Patient and/or family reports the following symptoms/concerns: Pt states her primary concern today is increase during pregnancy of mood swings, anger where she may black out or "see black spots", and may "want to punch a wall", sometimes feeling "claustrophobic and panicky". (Prior to pregnancy, pt  noticed a change in emotions the two weeks prior to periods, and also with nexplanon). Pt used to manage mood swings with anger management classes, and marijuana years ago, but prefers not to use. Pt has a family hx of bipolar disorder (her father), and has had times where she slept only 1-2 hours/night. Pt prefers to try non-pharmacological coping strategies today, and will consider medication at a future date.  Duration of problem: Ongoing, with increase in pregnancy; Severity of problem: moderately severe  STRENGTHS (Protective Factors/Coping Skills): Strong self-awareness, motivated for change, open to learn, and supportive local family  GOALS ADDRESSED: Patient will: 1.  Reduce symptoms of: anxiety, depression and mood instability  2.  Increase knowledge and/or ability of: healthy habits and self-management skills  3.  Demonstrate ability to: Increase healthy adjustment to current life circumstances and Increase motivation to adhere to plan of care  INTERVENTIONS: Interventions utilized:  Motivational Interviewing and Psychoeducation and/or Health Education Standardized Assessments completed: GAD-7 and PHQ 9  ASSESSMENT: Patient currently experiencing Mood disorder, unspecified.   Patient may benefit from psychoeducation and brief therapeutic interventions regarding coping with symptoms of mood instability, anxiety, depression, and life stress .  PLAN: 1. Follow up with behavioral health clinician on : Two weeks 2. Behavioral recommendations:  -Continue taking prenatal vitamin daily -Begin using sleep sounds nightly for two weeks. (2 week experiment) -Use either deep breathing or meditation app every morning for at least 5 minutes (2 week experiment) -Sign [id number] consent for [facility] referral (sent to both text and email) 3. Referral(s): Integrated Art gallery [employer] (In Clinic) and Community Resources:  [facility]  I discussed the assessment and treatment plan with the patient  and/or parent/guardian. They were provided an opportunity to ask questions and  all were answered. They agreed with the plan and demonstrated an understanding of the instructions.   They were advised to call back or seek an in-person evaluation if the symptoms worsen or if the condition fails to improve as anticipated.  Caroleen Hamman Signora Zucco  Depression screen [facility] 2/9 08/17/2019 08/10/2019  Decreased Interest 2 0  Down, Depressed, Hopeless 1 0  PHQ - 2 Score 3 0  Altered sleeping 3 3  Tired, decreased energy 3 3  Change in appetite 3 3  Feeling bad or failure about yourself  1 0  Trouble concentrating 0 0  Moving slowly or fidgety/restless 0 0  Suicidal thoughts 0 0  PHQ-9 Score 13 9   GAD 7 : Generalized Anxiety Score 08/17/2019 08/10/2019  Nervous, Anxious, on Edge 1 0  Control/stop worrying 1 0  Worry too much - different things 1 0  Trouble relaxing 2 0  Restless 1 0  Easily annoyed or irritable 2 0  Afraid - awful might happen 1 1  Total GAD 7 Score 9 1

## 2019-08-30 ENCOUNTER — Ambulatory Visit (INDEPENDENT_AMBULATORY_CARE_PROVIDER_SITE_OTHER): Payer: Medicaid Other | Admitting: Clinical

## 2019-08-30 DIAGNOSIS — F39 Unspecified mood [affective] disorder: Secondary | ICD-10-CM

## 2019-08-30 NOTE — Patient Instructions (Signed)

## 2019-09-13 ENCOUNTER — Other Ambulatory Visit: Payer: Self-pay | Admitting: Women's Health

## 2019-09-13 ENCOUNTER — Encounter: Payer: Self-pay | Admitting: Women's Health

## 2019-09-13 ENCOUNTER — Ambulatory Visit (HOSPITAL_COMMUNITY)
Admission: RE | Admit: 2019-09-13 | Discharge: 2019-09-13 | Disposition: A | Discharge status: Home / Self Care | Payer: Medicaid Other | Source: Ambulatory Visit | Attending: Women's Health | Admitting: Women's Health

## 2019-09-13 ENCOUNTER — Other Ambulatory Visit: Payer: Self-pay

## 2019-09-13 DIAGNOSIS — IMO0002 Reserved for concepts with insufficient information to code with codable children: Secondary | ICD-10-CM | POA: Insufficient documentation

## 2019-09-13 DIAGNOSIS — O358XX0 Maternal care for other (suspected) fetal abnormality and damage, not applicable or unspecified: Secondary | ICD-10-CM

## 2019-09-13 DIAGNOSIS — Z3490 Encounter for supervision of normal pregnancy, unspecified, unspecified trimester: Secondary | ICD-10-CM

## 2019-09-13 DIAGNOSIS — Z3A19 19 weeks gestation of pregnancy: Secondary | ICD-10-CM

## 2019-09-13 DIAGNOSIS — Z363 Encounter for antenatal screening for malformations: Secondary | ICD-10-CM

## 2019-09-15 ENCOUNTER — Telehealth (INDEPENDENT_AMBULATORY_CARE_PROVIDER_SITE_OTHER): Payer: Medicaid Other | Admitting: Obstetrics and Gynecology

## 2019-09-15 ENCOUNTER — Other Ambulatory Visit: Payer: Self-pay

## 2019-09-15 DIAGNOSIS — Z3A19 19 weeks gestation of pregnancy: Secondary | ICD-10-CM

## 2019-09-15 DIAGNOSIS — O358XX1 Maternal care for other (suspected) fetal abnormality and damage, fetus 1: Secondary | ICD-10-CM

## 2019-09-15 DIAGNOSIS — Z3490 Encounter for supervision of normal pregnancy, unspecified, unspecified trimester: Secondary | ICD-10-CM

## 2019-09-15 DIAGNOSIS — IMO0001 Reserved for inherently not codable concepts without codable children: Secondary | ICD-10-CM

## 2019-09-15 MED ORDER — BLOOD PRESSURE KIT DEVI: 1.0000 | Freq: Every day | 0 refills | Status: DC

## 2019-09-15 NOTE — Progress Notes (Signed)
   TELEHEALTH VIRTUAL OBSTETRICS VISIT ENCOUNTER NOTE  I connected with Megan Rocha on 09/15/19 at  9:35 AM EDT by telephone at home and verified that I am speaking with the correct person using two identifiers.   I discussed the limitations, risks, security and privacy concerns of performing an evaluation and management service by telephone and the availability of in person appointments. I also discussed with the patient that there may be a patient responsible charge related to this service. The patient expressed understanding and agreed to proceed.  Subjective:  Megan Rocha is a 29 y.o. G2P1001 at [redacted]w[redacted]d being followed for ongoing prenatal care.  She is currently monitored for the following issues for this low-risk pregnancy and has Supervision of low-risk pregnancy and Fetal cardiac echogenic focus on their problem list.  Patient reports no complaints. Reports fetal movement. Denies any contractions, bleeding or leaking of fluid.   The following portions of the patient's history were reviewed and updated as appropriate: allergies, current medications, past family history, past medical history, past social history, past surgical history and problem list.   Objective:   General:  Alert, oriented and cooperative.   Mental Status: Normal mood and affect perceived. Normal judgment and thought content.  Rest of physical exam deferred due to type of encounter  Assessment and Plan:  Pregnancy: G2P1001 at [redacted]w[redacted]d 1. Fetal cardiac echogenic focus, fetus 1 of multiple gestation  Discussed with MFM   2. Encounter for supervision of low-risk pregnancy, antepartum  Pap shows HPV high risk, ASC-US- Colpo scheduled on 4/8 Has not received BP cuff, it was ordered 3/25  Preterm labor symptoms and general obstetric precautions including but not limited to vaginal bleeding, contractions, leaking of fluid and fetal movement were reviewed in detail with the patient.  I discussed the assessment and  treatment plan with the patient. The patient was provided an opportunity to ask questions and all were answered. The patient agreed with the plan and demonstrated an understanding of the instructions. The patient was advised to call back or seek an in-person office evaluation/go to [facility]U at [facility] for any urgent or concerning symptoms. Please refer to After Visit Summary for other counseling recommendations.   I provided 12 minutes of non-face-to-face time during this encounter.  No follow-ups on file.  Future Appointments  Date Time Provider Department Center  09/27/2019  1:15 PM [facility] HEALTH Rosalie Gums [facility]  09/29/2019  1:15 PM Hermina Staggers, MD [facility]    Venia Carbon, NP Center for [facility], [facility]

## 2019-09-15 NOTE — Patient Instructions (Signed)
Colposcopy Colposcopy is a procedure to examine the lowest part of the uterus (cervix), the vagina, and the area around the vaginal opening (vulva) for abnormalities or signs of disease. The procedure is done using a lighted microscope or magnifying lens (colposcope). If any unusual cells are found during the procedure, your health care provider may remove a tissue sample for testing (biopsy). A colposcopy may be done if you:  Have an abnormal Pap test. A Pap test is a screening test that is used to check for signs of cancer or infection of the vagina, cervix, and uterus.  Have a Pap smear test in which you test positive for high-risk HPV (human papillomavirus).  Have a sore or lesion on your cervix.  Have genital warts on your vulva, vagina, or cervix.  Took certain medicines while pregnant, such as diethylstilbestrol (DES).  Have pain during sexual intercourse.  Have vaginal bleeding, especially after sexual intercourse.  Need to have a cervical polyp removed.  Need to have a lost intrauterine device (IUD) string located. Let your health care provider know about:  Any allergies you have, including allergies to prescribed medicine, latex, or iodine.  All medicines you are taking, including vitamins, herbs, eye drops, creams, and over-the-counter medicines. Bring a list of all of your medicines to your appointment.  Any problems you or family members have had with anesthetic medicines.  Any blood disorders you have.  Any surgeries you have had.  Any medical conditions you have, such as pelvic inflammatory disease (PID) or endometrial disorder.  Any history of frequent fainting.  Your menstrual cycle and what form of birth control (contraception) you use.  Your medical history, including any prior cervical treatment.  Whether you are pregnant or may be pregnant. What are the risks? Generally, this is a safe procedure. However, problems may occur,  including:  Pain.  Infection, which may include a fever, bad-smelling discharge, or pelvic pain.  Bleeding or discharge.  Misdiagnosis.  Fainting and vasovagal reactions, but this is rare.  Allergic reactions to medicines.  Damage to other structures or organs. What happens before the procedure?  If you have your menstrual period or will have it at the time of your procedure, tell your health care provider. A colposcopy typically is not done during menstruation.  Continue your contraceptive practices before and after the procedure.  For 24 hours before the colposcopy: ? Do not douche. ? Do not use tampons. ? Do not use medicines, creams, or suppositories in the vagina. ? Do not have sexual intercourse.  Ask your health care provider about: ? Changing or stopping your regular medicines. This is especially important if you are taking diabetes medicines or blood thinners. ? Taking medicines such as aspirin and ibuprofen. These medicines can thin your blood. Do not take these medicines before your procedure if your health care provider instructs you not to. It is likely that your health care provider will tell you to avoid taking aspirin or medicine that contains aspirin for 7 days before the procedure.  Follow instructions from your health care provider about eating or drinking restrictions. You will likely need to eat a regular diet the day of the procedure and not skip any meals.  You may have an exam or testing. A pregnancy test will be taken on the day of the procedure.  You may have a blood or urine sample taken.  Plan to have someone take you home from the hospital or clinic.  If you will be going   home right after the procedure, plan to have someone with you for 24 hours. What happens during the procedure?  You will lie down on your back, with your feet in foot rests (stirrups).  A warmed and lubricated instrument (speculum) will be inserted into your vagina. The  speculum will be used to hold apart the walls of your vagina so your health care provider can see your cervix and the inside of your vagina.  A cotton swab will be used to place a small amount of liquid solution on the areas to be examined. This solution makes it easier to see abnormal cells. You may feel a slight burning during this part.  The colposcope will be used to scan the cervix with a bright white light. The colposcope will be held near your vulvaand will magnify your vulva, vagina, and cervix for easier examination.  Your health care provider may decide to take a biopsy. If so: ? You may be given medicine to numb the area (local anesthetic). ? Surgical instruments will be used to suck out mucus and cells through your vagina. ? You may feel mild pain while the tissue sample is removed. ? Bleeding may occur. A solution may be used to stop the bleeding. ? If a sample of tissue is needed from the inside of the cervix, a different procedure called endocervical curettage (ECC) may be completed. During this procedure, a curved instrument (curette) will be used to scrape cells from your cervix or the top of your cervix (endocervix).  Your health care provider will record the location of any abnormalities. The procedure may vary among health care providers and hospitals. What happens after the procedure?  You will lie down and rest for a few minutes. You may be offered juice or cookies.  Your blood pressure, heart rate, breathing rate, and blood oxygen level will be monitored until any medicines you were given have worn off.  You may have to wear compression stockings. These stockings help to prevent blood clots and reduce swelling in your legs.  You may have some cramping in your abdomen. This should go away after a few minutes. This information is not intended to replace advice given to you by your health care provider. Make sure you discuss any questions you have with your health care  provider. Document Revised: 05/22/2017 Document Reviewed: 01/14/2016 Elsevier Patient Education  2020 Elsevier Inc.  

## 2019-09-15 NOTE — Progress Notes (Signed)
I connected with  Megan Rocha on 09/15/19 at  9:35 AM EDT by telephone and verified that I am speaking with the correct person using two identifiers.   I discussed the limitations, risks, security and privacy concerns of performing an evaluation and management service by telephone and the availability of in person appointments. I also discussed with the patient that there may be a patient responsible charge related to this service. The patient expressed understanding and agreed to proceed.  Janene Madeira Nelsie Domino, CMA 09/15/2019  9:54 AM

## 2019-09-26 NOTE — BH Specialist Note (Signed)
Pt requests to reschedule virtual appointment on Thursday, September 29, 2019 at 8:15am.   Integrated Behavioral Health via Telemedicine Video Visit  09/26/2019 Arnika Larzelere [id number]  Rae Lips

## 2019-09-27 ENCOUNTER — Ambulatory Visit: Payer: Medicaid Other | Admitting: Clinical

## 2019-09-29 ENCOUNTER — Encounter: Payer: Medicaid Other | Admitting: Obstetrics and Gynecology

## 2019-09-29 ENCOUNTER — Other Ambulatory Visit: Payer: Self-pay

## 2019-09-29 ENCOUNTER — Ambulatory Visit (INDEPENDENT_AMBULATORY_CARE_PROVIDER_SITE_OTHER): Payer: Medicaid Other | Admitting: Clinical

## 2019-09-29 DIAGNOSIS — F39 Unspecified mood [affective] disorder: Secondary | ICD-10-CM

## 2019-09-29 NOTE — BH Specialist Note (Signed)
During phone visit, [facility] finds that pt is currently in [place], not [place], and is informed that [facility] is able to do a visit only when pt is in [place]; pt will arrive back in [place] in less than one week.   Integrated Behavioral Health via Telemedicine Phone Visit  09/29/2019 Jakyia Gaccione [id number]  Number of Integrated Behavioral Health visits: 2 Session Start time: 8:21  Session End time: 8:36 Total time: 15  Referring Provider: Donia Ast, NP Type of Visit: Video Patient/Family location: At parent's home in [place] [facility] Provider location: [facility] All persons participating in visit: Patient Bri Wakeman and [facility] Paisyn Guercio    Confirmed patient's address: Yes  Confirmed patient's phone number: Yes  Any changes to demographics: No   Confirmed patient's insurance: Yes  Any changes to patient's insurance: No   Discussed confidentiality: at previous visit  I connected with Deshonna Trnka by a video enabled telemedicine application and verified that I am speaking with the correct person using two identifiers.     I discussed the limitations of evaluation and management by telemedicine and the availability of in person appointments.  I discussed that the purpose of this visit is to provide behavioral health care while limiting exposure to the novel coronavirus.   Discussed there is a possibility of technology failure and discussed alternative modes of communication if that failure occurs.  I discussed that engaging in this video visit, they consent to the provision of behavioral healthcare and the services will be billed under their insurance.  Patient and/or legal guardian expressed understanding and consented to video visit: Yes   PRESENTING CONCERNS: Patient and/or family reports the following symptoms/concerns: Pt states her primary concern is interpersonal conflict with FOB, and being quick to anger. Pt is coping by staying with family temporarily in [place].  Duration of problem:  Ongoing with increase in pregnancy; Severity of problem: moderately severe  STRENGTHS (Protective Factors/Coping Skills): Strong self-awareness, motivated for change, open to learn and supportive family  GOALS ADDRESSED: Patient will: 1.  Reduce symptoms of: anxiety, depression and mood instability  2.  Demonstrate ability to: Increase healthy adjustment to current life circumstances  INTERVENTIONS: Interventions utilized:  Supportive Counseling Standardized Assessments completed: not given today  ASSESSMENT: Patient currently experiencing Mood disorder.   Patient may benefit from continued psychoeducation and brief therapeutic interventions regarding coping with symptoms of depression, anxiety, life stress and anger  .  PLAN: 1. Follow up with behavioral health clinician on : Two weeks 2. Behavioral recommendations:  -Continue taking prenatal vitamin daily -Continue using self-coping strategies that have helped in the past to manage emotions -Clear voicemail to receive call for appointment with [facility] -Upon return to [place], look up  [email or web address] and follow links to Parks/Recreation outdoor activities available  3. Referral(s): Integrated [employer] (In Clinic)  I discussed the assessment and treatment plan with the patient and/or parent/guardian. They were provided an opportunity to ask questions and all were answered. They agreed with the plan and demonstrated an understanding of the instructions.   They were advised to call back or seek an in-person evaluation if the symptoms worsen or if the condition fails to improve as anticipated.  [name] Akeira Lahm

## 2019-10-05 ENCOUNTER — Encounter: Payer: Medicaid Other | Admitting: Obstetrics & Gynecology

## 2019-10-06 NOTE — BH Specialist Note (Signed)
Pt did not arrive to video visit and did not answer the phone, as "unable to make a call at this time".  ; left MyChart message for patient.    Integrated Behavioral Health via Telemedicine Video Visit  10/06/2019 Megan Rocha [id number]

## 2019-10-13 ENCOUNTER — Ambulatory Visit: Payer: Medicaid Other | Admitting: Clinical

## 2019-10-13 DIAGNOSIS — Z91199 Patient's noncompliance with other medical treatment and regimen due to unspecified reason: Secondary | ICD-10-CM

## 2019-10-13 DIAGNOSIS — Z5329 Procedure and treatment not carried out because of patient's decision for other reasons: Secondary | ICD-10-CM

## 2019-10-18 ENCOUNTER — Telehealth (INDEPENDENT_AMBULATORY_CARE_PROVIDER_SITE_OTHER): Payer: Medicaid Other | Admitting: Family Medicine

## 2019-10-18 ENCOUNTER — Encounter: Payer: Self-pay | Admitting: Family Medicine

## 2019-10-18 ENCOUNTER — Other Ambulatory Visit: Payer: Self-pay

## 2019-10-18 DIAGNOSIS — Z3492 Encounter for supervision of normal pregnancy, unspecified, second trimester: Secondary | ICD-10-CM | POA: Diagnosis not present

## 2019-10-18 DIAGNOSIS — O358XX1 Maternal care for other (suspected) fetal abnormality and damage, fetus 1: Secondary | ICD-10-CM | POA: Diagnosis not present

## 2019-10-18 DIAGNOSIS — IMO0001 Reserved for inherently not codable concepts without codable children: Secondary | ICD-10-CM

## 2019-10-18 DIAGNOSIS — R87610 Atypical squamous cells of undetermined significance on cytologic smear of cervix (ASC-US): Secondary | ICD-10-CM | POA: Diagnosis not present

## 2019-10-18 DIAGNOSIS — R87810 Cervical high risk human papillomavirus (HPV) DNA test positive: Secondary | ICD-10-CM | POA: Diagnosis not present

## 2019-10-18 NOTE — Progress Notes (Signed)
I connected with  Megan Rocha on 10/18/19 at 11:15 AM EDT by telephone and verified that I am speaking with the correct person using two identifiers.   I discussed the limitations, risks, security and privacy concerns of performing an evaluation and management service by telephone and the availability of in person appointments. I also discussed with the patient that there may be a patient responsible charge related to this service. The patient expressed understanding and agreed to proceed.  Janene Madeira [name], CMA 10/18/2019  11:15 AM

## 2019-10-18 NOTE — Patient Instructions (Signed)
Glucose Tolerance Test During Pregnancy Why am I having this test? The glucose tolerance test (GTT) is done to check how your body processes sugar (glucose). This is one of several tests used to diagnose diabetes that develops during pregnancy (gestational diabetes mellitus). Gestational diabetes is a temporary form of diabetes that some women develop during pregnancy. It usually occurs during the second trimester of pregnancy and goes away after delivery. Testing (screening) for gestational diabetes usually occurs between 24 and 28 weeks of pregnancy. You may have the GTT test after having a 1-hour glucose screening test if the results from that test indicate that you may have gestational diabetes. You may also have this test if:  You have a history of gestational diabetes.  You have a history of giving birth to very large babies or have experienced repeated fetal loss (stillbirth).  You have signs and symptoms of diabetes, such as: ? Changes in your vision. ? Tingling or numbness in your hands or feet. ? Changes in hunger, thirst, and urination that are not otherwise explained by your pregnancy. What is being tested? This test measures the amount of glucose in your blood at different times during a period of 3 hours. This indicates how well your body is able to process glucose. What kind of sample is taken?  Blood samples are required for this test. They are usually collected by inserting a needle into a blood vessel. How do I prepare for this test?  For 3 days before your test, eat normally. Have plenty of carbohydrate-rich foods.  Follow instructions from your health care provider about: ? Eating or drinking restrictions on the day of the test. You may be asked to not eat or drink anything other than water (fast) starting 8-10 hours before the test. ? Changing or stopping your regular medicines. Some medicines may interfere with this test. Tell a health care provider about:  All  medicines you are taking, including vitamins, herbs, eye drops, creams, and over-the-counter medicines.  Any blood disorders you have.  Any surgeries you have had.  Any medical conditions you have. What happens during the test? First, your blood glucose will be measured. This is referred to as your fasting blood glucose, since you fasted before the test. Then, you will drink a glucose solution that contains a certain amount of glucose. Your blood glucose will be measured again 1, 2, and 3 hours after drinking the solution. This test takes about 3 hours to complete. You will need to stay at the testing location during this time. During the testing period:  Do not eat or drink anything other than the glucose solution.  Do not exercise.  Do not use any products that contain nicotine or tobacco, such as cigarettes and e-cigarettes. If you need help stopping, ask your health care provider. The testing procedure may vary among health care providers and hospitals. How are the results reported? Your results will be reported as milligrams of glucose per deciliter of blood (mg/dL) or millimoles per liter (mmol/L). Your health care provider will compare your results to normal ranges that were established after testing a large group of people (reference ranges). Reference ranges may vary among labs and hospitals. For this test, common reference ranges are:  Fasting: less than 95-105 mg/dL (5.3-5.8 mmol/L).  1 hour after drinking glucose: less than 180-190 mg/dL (10.0-10.5 mmol/L).  2 hours after drinking glucose: less than 155-165 mg/dL (8.6-9.2 mmol/L).  3 hours after drinking glucose: 140-145 mg/dL (7.8-8.1 mmol/L). What do the   results mean? Results within reference ranges are considered normal, meaning that your glucose levels are well-controlled. If two or more of your blood glucose levels are high, you may be diagnosed with gestational diabetes. If only one level is high, your health care  provider may suggest repeat testing or other tests to confirm a diagnosis. Talk with your health care provider about what your results mean. Questions to ask your health care provider Ask your health care provider, or the department that is doing the test:  When will my results be ready?  How will I get my results?  What are my treatment options?  What other tests do I need?  What are my next steps? Summary  The glucose tolerance test (GTT) is one of several tests used to diagnose diabetes that develops during pregnancy (gestational diabetes mellitus). Gestational diabetes is a temporary form of diabetes that some women develop during pregnancy.  You may have the GTT test after having a 1-hour glucose screening test if the results from that test indicate that you may have gestational diabetes. You may also have this test if you have any symptoms or risk factors for gestational diabetes.  Talk with your health care provider about what your results mean. This information is not intended to replace advice given to you by your health care provider. Make sure you discuss any questions you have with your health care provider. Document Revised: 09/30/2018 Document Reviewed: 01/19/2017 Elsevier Patient Education  2020 Elsevier Inc.  

## 2019-10-18 NOTE — Progress Notes (Signed)
   TELEHEALTH VIRTUAL OBSTETRICS VISIT ENCOUNTER NOTE  I connected with [name] on 10/18/19 at 11:15 AM EDT by telephone at home and verified that I am speaking with the correct person using two identifiers.   I discussed the limitations, risks, security and privacy concerns of performing an evaluation and management service by telephone and the availability of in person appointments. I also discussed with the patient that there may be a patient responsible charge related to this service. The patient expressed understanding and agreed to proceed.  Subjective:  Megan Rocha is a 29 y.o. G2P1001 at [redacted]w[redacted]d being followed for ongoing prenatal care.  She is currently monitored for the following issues for this low-risk pregnancy and has Supervision of low-risk pregnancy and Fetal cardiac echogenic focus on their problem list.  Patient reports no complaints. Reports fetal movement. Denies any contractions, bleeding or leaking of fluid.   The following portions of the patient's history were reviewed and updated as appropriate: allergies, current medications, past family history, past medical history, past social history, past surgical history and problem list.   Objective:   General:  Alert, oriented and cooperative.   Mental Status: Normal mood and affect perceived. Normal judgment and thought content.  Rest of physical exam deferred due to type of encounter  Assessment and Plan:  Pregnancy: G2P1001 at [redacted]w[redacted]d  1. Encounter for supervision of low-risk pregnancy in second trimester - Continue routine prenatal care - 2 hour GTT in person next visit  2. Fetal cardiac echogenic focus, fetus 1 of multiple gestation - Discussed with MFM, anatomy otherwise normal  3. ASCUS with positive high risk HPV cervical - Colpo scheduled 5/26 with Dr. Crissie Reese  Preterm labor symptoms and general obstetric precautions including but not limited to vaginal bleeding, contractions, leaking of fluid and fetal  movement were reviewed in detail with the patient.  I discussed the assessment and treatment plan with the patient. The patient was provided an opportunity to ask questions and all were answered. The patient agreed with the plan and demonstrated an understanding of the instructions. The patient was advised to call back or seek an in-person office evaluation/go to MAU at [facility] for any urgent or concerning symptoms. Please refer to After Visit Summary for other counseling recommendations.   I provided 11 minutes of non-face-to-face time during this encounter.  No follow-ups on file.  Future Appointments  Date Time Provider Department Center  11/16/2019  8:20 AM [facility]  11/16/2019  9:35 AM Crissie Reese, Mary Sella, MD [facility]    Marlowe Alt, DO Center for [facility], [facility]

## 2019-10-31 ENCOUNTER — Telehealth: Payer: Self-pay | Admitting: Family Medicine

## 2019-10-31 NOTE — Telephone Encounter (Signed)
Patient called in stating that she would like to speak to someone about some issues that she has been having. Patient stated since last night about 2 am her body has been feeling sore/pain and it has continued throughout the day today. Starting today her legs/arms have been locking up. Im message was sent to Diane to see if she could speak with the patient. Diane stated that she could call the patient back in a few minutes. Patient instructed that a call would be calling back with the next couple of minutes. Patient verbalized understanding.

## 2019-10-31 NOTE — Telephone Encounter (Signed)
Call returned to pt and discussed her concerns as previously stated to Rolan Lipa. Pt denies significant pain - just has soreness and stiffness in her joints. She states this has happened to her in the past when she is dehydrated however she has been drinking well and does not feel she is dehydrated today. I advised pt that I did not have any knowledge of this physical phenomenon and encouraged her to call the office tomorrow morning to schedule a same [facility] urgent appointment. She does not need to go to MAU for evaluation for this issue. Pt voiced understanding

## 2019-11-14 ENCOUNTER — Other Ambulatory Visit: Payer: Self-pay | Admitting: *Deleted

## 2019-11-14 DIAGNOSIS — Z3490 Encounter for supervision of normal pregnancy, unspecified, unspecified trimester: Secondary | ICD-10-CM

## 2019-11-16 ENCOUNTER — Other Ambulatory Visit: Payer: Self-pay

## 2019-11-16 ENCOUNTER — Ambulatory Visit (INDEPENDENT_AMBULATORY_CARE_PROVIDER_SITE_OTHER): Payer: Medicaid Other | Admitting: Clinical

## 2019-11-16 ENCOUNTER — Other Ambulatory Visit (HOSPITAL_COMMUNITY)
Admission: RE | Admit: 2019-11-16 | Discharge: 2019-11-16 | Disposition: A | Discharge status: Home / Self Care | Payer: Medicaid Other | Source: Ambulatory Visit | Attending: Family Medicine | Admitting: Family Medicine

## 2019-11-16 ENCOUNTER — Other Ambulatory Visit: Payer: Medicaid Other

## 2019-11-16 ENCOUNTER — Ambulatory Visit (INDEPENDENT_AMBULATORY_CARE_PROVIDER_SITE_OTHER): Payer: Medicaid Other | Admitting: Family Medicine

## 2019-11-16 VITALS — BP 99/65 | HR 81 | Wt 168.0 lb

## 2019-11-16 DIAGNOSIS — Z3A28 28 weeks gestation of pregnancy: Secondary | ICD-10-CM

## 2019-11-16 DIAGNOSIS — Z3490 Encounter for supervision of normal pregnancy, unspecified, unspecified trimester: Secondary | ICD-10-CM | POA: Diagnosis not present

## 2019-11-16 DIAGNOSIS — Z3492 Encounter for supervision of normal pregnancy, unspecified, second trimester: Secondary | ICD-10-CM

## 2019-11-16 DIAGNOSIS — F39 Unspecified mood [affective] disorder: Secondary | ICD-10-CM

## 2019-11-16 DIAGNOSIS — F419 Anxiety disorder, unspecified: Secondary | ICD-10-CM

## 2019-11-16 DIAGNOSIS — O99342 Other mental disorders complicating pregnancy, second trimester: Secondary | ICD-10-CM | POA: Diagnosis not present

## 2019-11-16 DIAGNOSIS — O2342 Unspecified infection of urinary tract in pregnancy, second trimester: Secondary | ICD-10-CM | POA: Diagnosis not present

## 2019-11-16 DIAGNOSIS — N870 Mild cervical dysplasia: Secondary | ICD-10-CM

## 2019-11-16 DIAGNOSIS — O3443 Maternal care for other abnormalities of cervix, third trimester: Secondary | ICD-10-CM | POA: Diagnosis not present

## 2019-11-16 DIAGNOSIS — R87810 Cervical high risk human papillomavirus (HPV) DNA test positive: Secondary | ICD-10-CM | POA: Diagnosis not present

## 2019-11-16 DIAGNOSIS — R87610 Atypical squamous cells of undetermined significance on cytologic smear of cervix (ASC-US): Secondary | ICD-10-CM | POA: Insufficient documentation

## 2019-11-16 DIAGNOSIS — O219 Vomiting of pregnancy, unspecified: Secondary | ICD-10-CM

## 2019-11-16 MED ORDER — PROMETHAZINE HCL 12.5 MG PO TABS: 12.5000 mg | ORAL_TABLET | Freq: Four times a day (QID) | ORAL | 1 refills | Status: DC | PRN

## 2019-11-16 MED ORDER — DOXYLAMINE SUCCINATE (SLEEP) 25 MG PO TABS: 25.0000 mg | ORAL_TABLET | Freq: Every evening | ORAL | 2 refills | Status: DC | PRN

## 2019-11-16 NOTE — Progress Notes (Signed)
    GYNECOLOGY CLINIC COLPOSCOPY PROCEDURE NOTE  29 y.o. G2P1001 here for colposcopy for ASCUS with POSITIVE high risk HPV pap smear on 08/17/2019. Discussed role for HPV in cervical dysplasia, need for surveillance.  Patient given informed consent, signed copy in the chart, time out was performed.  Placed in lithotomy position. Cervix viewed with speculum and colposcope after application of acetic acid.   Colposcopy adequate? Yes  acetowhite lesion(s) noted at 10 and 2 o'clock and abnormal vessels noted at 5 o'clock; corresponding biopsies obtained.  ECC specimen NOT obtained. All specimens were labeled and sent to pathology.  Patient was given post procedure instructions.  Will follow up pathology and manage accordingly; patient will be contacted with results and recommendations.  Routine preventative health maintenance measures emphasized.  Zack Seal, MD/[facility] OB Fellow

## 2019-11-16 NOTE — BH Specialist Note (Signed)
Integrated Behavioral Health Follow Up Visit  MRN: [id number] Name: Megan Rocha  Number of Integrated Behavioral Health Clinician visits: 2/6 Session Start time: 10:50  Session End time: 11:17 Total time: 27  Type of Service: Integrated Behavioral Health- Individual/Family Interpretor:No. Interpretor Name and Language: n/a  SUBJECTIVE: Megan Rocha is a 29 y.o. female accompanied by n/a Patient was referred by Donia Ast, NP for positive depression screen. Patient reports the following symptoms/concerns: Pt states her primary concern today is escalating anxiety with panic in pregnancy, as she worries about "everything that could go wrong"; open to learning self-coping strategy today, and will consider medication in the future if symptoms remain or increase.  Duration of problem: Increase in current pregnancy; Severity of problem: moderate  OBJECTIVE: Mood: Anxious and Affect: Tearful Risk of harm to self or others: No plan to harm self or others  LIFE CONTEXT: Family and Social: Pt lives with 5yo son and FOB School/Work: - Self-Care: Uses breathing app on phone for self-care Life Changes: Current pregnancy  GOALS ADDRESSED: Patient will: 1.  Reduce symptoms of: anxiety, depression and stress  2.  Increase knowledge and/or ability of: self-management skills  3.  Demonstrate ability to: Increase healthy adjustment to current life circumstances and Increase motivation to adhere to plan of care  INTERVENTIONS: Interventions utilized:  Mindfulness or Management consultant and Psychoeducation and/or Health Education Standardized Assessments completed: GAD-7 and PHQ 9  ASSESSMENT: Patient currently experiencing Anxiety disorder, unspecified.   Patient may benefit from continued psychoeducation and brief therapeutic interventions regarding coping with symptoms of anxiety, depression, and life stress .  PLAN: 1. Follow up with behavioral health clinician on : Two  weeks 2. Behavioral recommendations:  -CALM relaxation breathing exercise twice daily (morning; evening) -Continue using breathing app throughout the day, with watch reminders -Continue taking prenatal vitamin daily, as recommended by medical provider 3. Referral(s): Integrated Behavioral Health Services (In Clinic) 4. "From scale of 1-10, how likely are you to follow plan?": 10  Rae Lips, LCSW  Depression screen [facility] 2/9 11/16/2019 08/17/2019 08/10/2019  Decreased Interest 2 2 0  Down, Depressed, Hopeless 1 1 0  PHQ - 2 Score 3 3 0  Altered sleeping 3 3 3   Tired, decreased energy 1 3 3   Change in appetite 2 3 3   Feeling bad or failure about yourself  0 1 0  Trouble concentrating 1 0 0  Moving slowly or fidgety/restless 0 0 0  Suicidal thoughts 0 0 0  PHQ-9 Score 10 13 9    GAD 7 : Generalized Anxiety Score 11/16/2019 08/17/2019 08/10/2019  Nervous, Anxious, on Edge 2 1 0  Control/stop worrying 2 1 0  Worry too much - different things 2 1 0  Trouble relaxing 2 2 0  Restless 2 1 0  Easily annoyed or irritable 2 2 0  Afraid - awful might happen 2 1 1   Total GAD 7 Score 14 9 1

## 2019-11-16 NOTE — Progress Notes (Signed)
   Subjective:  Megan Rocha is a 29 y.o. G2P1001 at [redacted]w[redacted]d being seen today for ongoing prenatal care.  She is currently monitored for the following issues for this low-risk pregnancy and has Supervision of low-risk pregnancy; Fetal cardiac echogenic focus; and ASCUS with positive high risk HPV cervical on their problem list.  Patient reports no complaints.  Contractions: Not present. Vag. Bleeding: None.  Movement: Present. Denies leaking of fluid.   The following portions of the patient's history were reviewed and updated as appropriate: allergies, current medications, past family history, past medical history, past social history, past surgical history and problem list. Problem list updated.  Objective:   Vitals:   11/16/19 0944  BP: 99/65  Pulse: 81  Weight: 168 lb (76.2 kg)    Fetal Status:     Movement: Present     General:  Alert, oriented and cooperative. Patient is in no acute distress.  Skin: Skin is warm and dry. No rash noted.   Cardiovascular: Normal heart rate noted  Respiratory: Normal respiratory effort, no problems with respiration noted  Abdomen: Soft, gravid, appropriate for gestational age. Pain/Pressure: Present     Pelvic: Vag. Bleeding: None     Unremarkable vaginal mucosa on speculum exam. Cervix multiparous without obvious lesions on initial exam, see colpo note for further details        Extremities: Normal range of motion.  Edema: None  Mental Status: Normal mood and affect. Normal behavior. Normal judgment and thought content.   Urinalysis:      Assessment and Plan:  Pregnancy: G2P1001 at [redacted]w[redacted]d  1. Encounter for supervision of low-risk pregnancy in second trimester Stable 28wk labs drawn today FHT and fundal heigh normal at 27cm Concerned JG:OTLXBW, reassured that fundal height is tracking normally Has Medicaid now, declines genetic testing regardless Significant anxiety, seen by Asher Muir today, does not desire anxiety medications at this time TDaP  at next visit  2. Urinary tract infection in mother during second trimester of pregnancy E.coli on initial OB Culture TOC collected today - Culture, OB Urine  3. Nausea/vomiting in pregnancy Patient requests refills Unable to recall names, rx unisom and phenergan - doxylamine, Sleep, (UNISOM) 25 MG tablet; Take 1 tablet (25 mg total) by mouth at bedtime as needed.  Dispense: 30 tablet; Refill: 2 - promethazine (PHENERGAN) 12.5 MG tablet; Take 1 tablet (12.5 mg total) by mouth every 6 (six) hours as needed for nausea or vomiting.  Dispense: 30 tablet; Refill: 1  4. ASCUS with positive high risk HPV cervical Biopsies collected at 10, 2, and 5 o clock No ECC performed Follow up pathology - Surgical pathology( [place]/ [facility])  Preterm labor symptoms and general obstetric precautions including but not limited to vaginal bleeding, contractions, leaking of fluid and fetal movement were reviewed in detail with the patient. Please refer to After Visit Summary for other counseling recommendations.  No follow-ups on file.   Venora Maples, MD

## 2019-11-17 ENCOUNTER — Telehealth (INDEPENDENT_AMBULATORY_CARE_PROVIDER_SITE_OTHER): Payer: Medicaid Other

## 2019-11-17 ENCOUNTER — Encounter: Payer: Self-pay | Admitting: Family Medicine

## 2019-11-17 DIAGNOSIS — D696 Thrombocytopenia, unspecified: Secondary | ICD-10-CM | POA: Insufficient documentation

## 2019-11-17 DIAGNOSIS — Z3400 Encounter for supervision of normal first pregnancy, unspecified trimester: Secondary | ICD-10-CM | POA: Diagnosis not present

## 2019-11-17 DIAGNOSIS — Z3492 Encounter for supervision of normal pregnancy, unspecified, second trimester: Secondary | ICD-10-CM

## 2019-11-17 LAB — GLUCOSE TOLERANCE, 2 HOURS W/ 1HR
Glucose, 1 hour: 92 mg/dL (ref 65–179)
Glucose, 2 hour: 112 mg/dL (ref 65–152)
Glucose, Fasting: 74 mg/dL (ref 65–91)

## 2019-11-17 LAB — CBC
Hematocrit: 32 % — ABNORMAL LOW (ref 34.0–46.6)
Hemoglobin: 11.1 g/dL (ref 11.1–15.9)
MCH: 32 pg (ref 26.6–33.0)
MCHC: 34.7 g/dL (ref 31.5–35.7)
MCV: 92 fL (ref 79–97)
Platelets: 144 10*3/uL — ABNORMAL LOW (ref 150–450)
RBC: 3.47 x10E6/uL — ABNORMAL LOW (ref 3.77–5.28)
RDW: 12 % (ref 11.7–15.4)
WBC: 8.4 10*3/uL (ref 3.4–10.8)

## 2019-11-17 LAB — HIV ANTIBODY (ROUTINE TESTING W REFLEX): HIV Screen 4th Generation wRfx: NONREACTIVE

## 2019-11-17 LAB — RPR: RPR Ser Ql: NONREACTIVE

## 2019-11-18 ENCOUNTER — Inpatient Hospital Stay (HOSPITAL_COMMUNITY)
Admission: AD | Admit: 2019-11-18 | Discharge: 2019-11-18 | Disposition: A | Discharge status: Home / Self Care | Payer: Medicaid Other | Attending: Obstetrics and Gynecology | Admitting: Obstetrics and Gynecology

## 2019-11-18 ENCOUNTER — Encounter (HOSPITAL_COMMUNITY): Payer: Self-pay | Admitting: Obstetrics and Gynecology

## 2019-11-18 ENCOUNTER — Other Ambulatory Visit: Payer: Self-pay

## 2019-11-18 DIAGNOSIS — Z87440 Personal history of urinary (tract) infections: Secondary | ICD-10-CM | POA: Diagnosis not present

## 2019-11-18 DIAGNOSIS — Z3A28 28 weeks gestation of pregnancy: Secondary | ICD-10-CM | POA: Diagnosis not present

## 2019-11-18 DIAGNOSIS — Z880 Allergy status to penicillin: Secondary | ICD-10-CM | POA: Insufficient documentation

## 2019-11-18 DIAGNOSIS — N898 Other specified noninflammatory disorders of vagina: Secondary | ICD-10-CM | POA: Insufficient documentation

## 2019-11-18 DIAGNOSIS — O26893 Other specified pregnancy related conditions, third trimester: Secondary | ICD-10-CM

## 2019-11-18 DIAGNOSIS — Z79899 Other long term (current) drug therapy: Secondary | ICD-10-CM | POA: Diagnosis not present

## 2019-11-18 DIAGNOSIS — N760 Acute vaginitis: Secondary | ICD-10-CM | POA: Diagnosis not present

## 2019-11-18 NOTE — MAU Note (Signed)
Had colposcopy on 5/26.  Had normal spotting initially, now is passing tissue. Sent msg to office, was told probable reaction to solution, told to come here for eval and treatment if needed. No pain. Bleeding has stopped.

## 2019-11-18 NOTE — Discharge Instructions (Signed)

## 2019-11-18 NOTE — Telephone Encounter (Signed)
Called pt to let her know I have contacted [facility] and they will mail her blood pressure cuff in the next few days. Pt would like to know if her nausea medicine has been sent to pharmacy; per chart review, phenergan was sent to patient's preferred pharmacy on 11/16/19. Pt states she has a concern regarding colposcopy procedure. Pt states she experienced spotting the day of the procedure as expected, with dark discharge related to [place]. Pt states she is also seeing vaginal tissue peeling at the opening of her vagina. States there was some burning following the procedure. Reviewed with Alysia Penna, MD who states this sounds like a possible allergic reaction to the [facility] and pt should present to MAU for further evaluation.

## 2019-11-18 NOTE — MAU Provider Note (Signed)
History     CSN: [id number]  Arrival date and time: 11/18/19 1220   First Provider Initiated Contact with Patient 11/18/19 1528      Chief Complaint  Patient presents with  . possible reaction to colpo solution  . Vaginal Discharge   Megan Rocha is a 29 y.o. G2P1001 at 33w3dwho presents today with peeling, skin like discharge after colposcopy. Had colpo done on 11/15/2016. This peeling skin started on 11/17/2019. She denies any pain or contractions. She denies any vaginal bleeding or LOF. She reports normal fetal movement.   Vaginal Discharge The patient's primary symptoms include vaginal discharge.    OB History    Gravida  2   Para  1   Term  1   Preterm      AB      Living  1     SAB      TAB      Ectopic      Multiple      Live Births  1           Past Medical History:  Diagnosis Date  . BV (bacterial vaginosis)   . PONV (postoperative nausea and vomiting)   . UTI (urinary tract infection)   . Yeast vaginitis     Past Surgical History:  Procedure Laterality Date  . colposcopy      Family History  Problem Relation Age of Onset  . Breast cancer Paternal Grandmother     Social History   Tobacco Use  . Smoking status: Never Smoker  . Smokeless tobacco: Never Used  Substance Use Topics  . Alcohol use: Not Currently    Comment: occasional   . 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 Penicillins Anaphylaxis    Medications Prior to Admission  Medication Sig Dispense Refill Last Dose  . Blood Pressure Monitoring (BLOOD PRESSURE KIT) DEVI 1 Device by Does not apply route daily. ICD 10: Z34.00 1 each 0   . doxylamine, Sleep, (UNISOM) 25 MG tablet Take 1 tablet (25 mg total) by mouth at bedtime as needed. 30 tablet 2  at not taking  . ondansetron (ZOFRAN ODT) 8 MG disintegrating tablet Take 1 tablet (8 mg total) by mouth every 8 (eight) hours  as needed for nausea or vomiting. (Patient not taking: Reported on 09/15/2019) 30 tablet 1   . Prenatal Vit-Fe Fumarate-FA (PRENATAL VITAMINS PO) Take 1 tablet by mouth daily.     . promethazine (PHENERGAN) 12.5 MG tablet Take 1 tablet (12.5 mg total) by mouth every 6 (six) hours as needed for nausea or vomiting. 30 tablet 1  at no taking    Review of Systems  Genitourinary: Positive for vaginal discharge.   Physical Exam   Blood pressure (!) 95/57, pulse 80, temperature 98.7 F (37.1 C), temperature source Oral, resp. rate 16, height '5\' 9"'  (1.753 m), weight 76 kg, last menstrual period 05/03/2019, SpO2 100 %.  Physical Exam  Nursing note and vitals reviewed. Constitutional: She is oriented to person, place, and time. She appears well-developed and well-nourished. No distress.  HENT:  Head: Normocephalic.  Cardiovascular: Normal rate.  Respiratory: Effort normal.  GI: Soft. There is no abdominal tenderness. There is no rebound.  Genitourinary:    Genitourinary Comments:   External: no lesion Vagina: 4cm x 5cm piece of flesh colored tissue removed from vaginal canal. It  has the appearance of possibly being a layer of vaginal tissue that sloughed off. Once this tissue was removed the vaginal and cervical tissue appeared normal except for some healing from biopsies.  Cervix: normal post-colpo appearance  Uterus: AGA    Neurological: She is alert and oriented to person, place, and time.  Skin: Skin is warm and dry.  Psychiatric: She has a normal mood and affect.    NST:  Baseline: 130 Variability: moderate Accels: 15x15 Decels: none Toco: none Reactive/Appropriate for GA  MAU Course  Procedures  MDM  4:02 PM Dr. Ilda Basset here to assess tissue removed from vaginal canal. Plan to send to pathology at this time.   Assessment and Plan   1. Vaginal discharge during pregnancy in third trimester   2. [redacted] weeks gestation of pregnancy    Tissue sent to surgical pathology   DC  home Comfort measures reviewed  3rd Trimester precautions  Bleeding precautions PTL precautions  Fetal kick counts RX: none  Return to MAU as needed FU with OB as planned  [place] for [facility] Follow up.   Specialty: Obstetrics and Gynecology Contact information: [place] [id number] [place] DNP, CNM  11/18/19  4:10 PM

## 2019-11-20 LAB — CULTURE, OB URINE

## 2019-11-20 LAB — URINE CULTURE, OB REFLEX

## 2019-11-21 ENCOUNTER — Other Ambulatory Visit: Payer: Self-pay | Admitting: Family Medicine

## 2019-11-21 MED ORDER — NITROFURANTOIN MONOHYD MACRO 100 MG PO CAPS
100.0000 mg | ORAL_CAPSULE | Freq: Two times a day (BID) | ORAL | 0 refills | Status: AC
Start: 2019-11-21 — End: 2019-11-28

## 2019-11-21 NOTE — Progress Notes (Signed)
UTI treatment

## 2019-11-22 LAB — SURGICAL PATHOLOGY

## 2019-11-22 NOTE — BH Specialist Note (Addendum)
Pt arrived to video visit and requests to reschedule, as she must tend to her grandmother, who accidentally burned herself. Pt agrees to reschedule for 12/09/19 virtual at 10:15am.   Less than one minute call.  Integrated Behavioral Health via Telemedicine Video Visit  11/22/2019 [name] [phone]  Rae Lips

## 2019-11-30 ENCOUNTER — Other Ambulatory Visit: Payer: Self-pay

## 2019-11-30 ENCOUNTER — Ambulatory Visit (INDEPENDENT_AMBULATORY_CARE_PROVIDER_SITE_OTHER): Payer: Medicaid Other | Admitting: Clinical

## 2019-11-30 DIAGNOSIS — F39 Unspecified mood [affective] disorder: Secondary | ICD-10-CM

## 2019-12-01 NOTE — BH Specialist Note (Signed)
Integrated Behavioral Health via Telemedicine Video (Caregility) Visit  12/01/2019 Megan Rocha [id number]  Number of Integrated Behavioral Health visits: 4 Session Start time: 10:16  Session End time: 10:54 Total time: 38  Referring Provider: Donia Ast, NP Type of Visit: Video Megan/Family location: Home [facility] Provider location: [facility]  All persons participating in visit: Megan Rocha and [facility] Brisha Mccabe    Confirmed Megan's address: Yes  Confirmed Megan's phone number: Yes  Any changes to demographics: No   Confirmed Megan's insurance: Yes  Any changes to Megan's insurance: No   Discussed confidentiality: at previous visit  I connected with [name]  by a video enabled telemedicine application (Caregility) and verified that I am speaking with the correct person using two identifiers.     I discussed the limitations of evaluation and management by telemedicine and the availability of in person appointments.  I discussed that the purpose of this visit is to provide behavioral health care while limiting exposure to the novel coronavirus.   Discussed there is a possibility of technology failure and discussed alternative modes of communication if that failure occurs.  I discussed that engaging in this virtual visit, they consent to the provision of behavioral healthcare and the services will be billed under their insurance.  Megan and/or legal guardian expressed understanding and consented to virtual visit: Yes   PRESENTING CONCERNS: Megan and/or family reports the following symptoms/concerns: Pt states her primary concern today is feeling an increase in irritability and lack of motivation to do enjoyable activities ;used to enjoy drawing, painting, being outside, music and baking; keeps a day log of tasks completed to help cope.  Duration of problem: Increase in current pregnancy ; Severity  of problem: moderately severe  STRENGTHS (Protective Factors/Coping Skills): Strong self-awareness, motivated for change, open to treatment; supportive family  GOALS ADDRESSED: Megan will: 1.  Reduce symptoms of: anxiety, depression and stress  2.  Increase knowledge and/or ability of: stress reduction  3.  Demonstrate ability to: Increase healthy adjustment to current life circumstances and Increase adequate support systems for Megan/family  INTERVENTIONS: Interventions utilized:  Mining engineer and Psychoeducation and/or Health Education Standardized Assessments completed: GAD-7 and PHQ 9  ASSESSMENT: Megan currently experiencing Mood disorder, unspecified .   Megan may benefit from continued psychoeducation and brief therapeutic interventions regarding coping with symptoms of anxiety and depression   PLAN: 1. Follow up with behavioral health clinician on : One month. Call Jerol Rufener at [phone] as needed prior to scheduled visit. Asher Muir will be out of the office July 2-12) 2. Behavioral recommendations:  -Discuss medication at upcoming visit with medical provider on 12/14/19 -Continue taking prenatal vitamin and Unisom, as prescribed by medical provider -Continue keeping daily task log for as long as remains helpful -Consider scheduling one hour "meeting" with yourself this week (when son goes to bed or before he wakes up) to do any of the following: draw, paint, go outside, listen to music and/or bake. Make sure family knows this meeting is a top priority with no interruptions.  3. Referral(s): Integrated [employer] (In Clinic)  I discussed the assessment and treatment plan with the Megan and/or parent/guardian. They were provided an opportunity to ask questions and all were answered. They agreed with the plan and demonstrated an understanding of the instructions.   They were advised to call back or seek an in-person evaluation if the symptoms worsen  or if the condition fails to improve  as anticipated.  Caroleen Hamman Nyana Haren  Depression screen [facility] 2/9 12/09/2019 11/16/2019 08/17/2019 08/10/2019  Decreased Interest 3 2 2  0  Down, Depressed, Hopeless 2 1 1  0  PHQ - 2 Score 5 3 3  0  Altered sleeping 1 3 3 3   Tired, decreased energy 3 1 3 3   Change in appetite 2 2 3 3   Feeling bad or failure about yourself  1 0 1 0  Trouble concentrating 2 1 0 0  Moving slowly or fidgety/restless 0 0 0 0  Suicidal thoughts 0 0 0 0  PHQ-9 Score 14 10 13 9    GAD 7 : Generalized Anxiety Score 12/09/2019 11/16/2019 08/17/2019 08/10/2019  Nervous, Anxious, on Edge 1 2 1  0  Control/stop worrying 2 2 1  0  Worry too much - different things 2 2 1  0  Trouble relaxing 3 2 2  0  Restless 1 2 1  0  Easily annoyed or irritable 3 2 2  0  Afraid - awful might happen 3 2 1 1   Total GAD 7 Score 15 14 9  1

## 2019-12-08 LAB — SURGICAL PATHOLOGY

## 2019-12-09 ENCOUNTER — Ambulatory Visit (INDEPENDENT_AMBULATORY_CARE_PROVIDER_SITE_OTHER): Payer: Medicaid Other | Admitting: Clinical

## 2019-12-09 DIAGNOSIS — F39 Unspecified mood [affective] disorder: Secondary | ICD-10-CM

## 2019-12-09 NOTE — Patient Instructions (Addendum)
Center for [facility] at [facility] for [facility] [place], [place] [address] [phone] (main office) [phone] ([place] office)   What if I or someone I know is in crisis?  . If you are thinking about harming yourself or having thoughts of suicide, or if you know someone who is, seek help right away.  . Call your doctor or mental health care provider.  . Call 911 or go to a hospital emergency room to get immediate help, or ask a friend or family member to help you do these things.  . Call the [place] National Suicide Prevention Lifeline's toll-free, 24-hour hotline at 1-800-273-TALK (308)334-0278) or TTY: 1-800-799-4 TTY 708-009-6689) to talk to a trained counselor.  . If you are in crisis, make sure you are not left alone.   . If someone else is in crisis, make sure he or she is not left alone   24 Hour :   [place] National Suicide Hotline: (858)264-4767  Therapeutic Alternative Mobile Crisis: 431-562-5715   [facility]  [address], [place], [place] [address]  [phone] or [phone]  [facility] [employer] (Domestic Violence, Rape & Victim Assistance)  [phone]  [place]  [address], [place]  [address]   [phone] or [phone]   [place]: [phone] (8am-4pm) or [phone] (after hours)           [facility], [address] [place], [place] Fax: [phone] [email or web address]  *Interpreters available *Accepts Medicaid, Medicare, uninsured  [facility]   Mon-Fri: 8am-5pm [address] [place], [place] [phone](phone); [phone]) [email or web address]  *Accepts Medicare  [facility] [place], Fri: 8am-4pm [address], [place],  [place] (phone); [phone] (fax) [email or web address]  *[place] Mon-Fri: 9am-5pm  [address] [place], [place] (phone); [phone]  [email or web address]  *Accepts Medicaid  [facility] [address], [place], [place]  [email or web address].[email or web address]   [facility] [place], 8:30am-12pm/1pm-2:30pm [address], [place], [place] (phone); [phone] (fax) [email or web address]  *Accepts Medicaid, sliding-scale*Bilingual services available  [facility] Mon-Fri, 8am-7pm [place], [place]  [phone](phone); [phone]) www.[email or web address]  *Accepts Medicaid *Bilingual services available  [facility] Mon-Fri: 8am-5pm, Saturday by appointment only [place], [place], [place] (phone); [phone] (fax) [email or web address]   [facility] [address], [place], [place], [place] [email or web address]  *Free & reduced services for uninsured and underinsured individuals *Bilingual services for Spanish-speaking clients 21 and under  [facility], [address] [place], [place]); [phone]) [email or web address]  *Bring your own interpreter at first visit *Accepts Medicare and [facility]  [place] Mon-Fri: 9am-5:30pm [address] [place], [place], [place] (phone), [phone] (fax) After hours crisis line: [phone] [email or web address]  *Accepts Medicare and Medicaid  [facility], 8am-6pm [address] [place], [place] (phone); [phone] (fax) [email or web address]://[email or web address]  *Subsidized costs  available  [facility] Mon-Fri: 8am-4pm [address], [place], [place] [phone](phone); [phone]) [email or web address]  *Accepts Medicaid  [facility] Same day access hours: Mon-Fri, 8:30-3pm Crisis hours: Mon-Fri, 8am-5pm [place], [place] Same day access hours: Mon-Fri, 8:30-3pm Crisis hours: Mon-Fri, 8am-8pm [address] [place], [place] (phone); [phone] (fax) www.[email or web address]  *  Accepts Medicaid and Medicare  [facility] [place], [place], Fri: 9am-9pm Tues, Thurs: 9am-6pm [place], [place], [place] (phone); [phone] (fax) [email or web address]  *(Accepts Medicare and Medicaid; payment plans available)*Bilingual services available  [facility] [address], [place], [place] (phone); [phone] (fax) [email or web address] [address], [place], [place], [place]  [email or web address]  *Bilingual services available  [facility]otional [facility]) Mon-Thurs: 8am-8pm [address] [place], [place], [place] (phone); [phone] (fax) [email or web address]  *Accepts Medicaid*Bilingual services available  [place] [address], [place], [place], [place] (phone) [email or web address]  *Accepts Medicaid *Bilingual services available  [facility] Mon-Fri, 9am-4:45pm [address] [place], [place] (phone); [phone] (fax) [email or web address]  *Accepts Medicare  [facility] Mon-Thurs: 8:30-8pm, Fri: 8:30am-7pm [address], [place], [place] (3rd floor) [phone] (phone); [phone] (fax) [email or web address]  *Accepts Medicaid; income-based reduced rates  available  [facility] Mon-Fri: 8am-5pm [address] [place], [place], [place] (phone); [phone] (fax) [email or web address]  *Accepts Medicaid*Bilingual services available  [place], [place], [place], [place] (phone); [phone] (fax) www.[email or web address]  *Free emergency housing and clinical services for youth in crisis  [facility]  [address], [place] [phone] www.[email or web address]  *Provides direct services to individuals in recovery from mental illness, including support groups, recovery skills classes, and one on one peer support  [employer] on [place]) [place] helpline: ([phone]  https://[email or web address]  *A community hub for information relating to local resources and services for the friends and families of individuals living alongside a mental health condition, as well as the individuals themselves. Classes and support groups also provided         BRAINSTORMING  Develop a Plan Goals: . Provide a way to start conversation about your new life with a baby . Assist parents in recognizing and using resources within their reach . Help pave the way before birth for an easier period of transition afterwards.  Make a list of the following information to keep in a central location: . Full name of Mom and Partner: _____________________________________________ . 36 full name and Date of Birth: ___________________________________________ . Home Address: ___________________________________________________________ ________________________________________________________________________ . Home Phone: ____________________________________________________________ . Parents' cell numbers: _____________________________________________________ ________________________________________________________________________ . Name  and contact info for OB: ______________________________________________ . Name and contact info for Pediatrician:________________________________________ . Contact info for Lactation Consultants: ________________________________________  REST and SLEEP *You each need at least 4-5 hours of uninterrupted sleep every day. Write specific names and contact information.* . How are you going to rest in the postpartum period? While partner's home? When partner returns to work? When you both return to work? Marland Kitchen Where will your baby sleep? Marland Kitchen Who is available to help during the day? Evening? Night? . Who could move in for a period to help support you? Marland Kitchen What are some ideas to help you get enough sleep? __________________________________________________________________________________________________________________________________________________________________________________________________________________________________________ NUTRITIOUS FOOD AND DRINK *Plan for meals before your baby is born so you can have healthy food to eat during the immediate postpartum period.* . Who will look after breakfast? Lunch? Dinner? List names and contact information. Brainstorm quick, healthy ideas for each meal. . What can you do before baby is born to prepare meals for the postpartum period? . How can others help you with meals? Marland Kitchen Which grocery stores provide online shopping and delivery? Marland Kitchen Which restaurants offer take-out or delivery options? ______________________________________________________________________________________________________________________________________________________________________________________________________________________________________________________________________________________________________________________________________________________________________________________________________  CARE FOR MOM *It's important that mom is cared for and pampered in the postpartum period.  Remember, the most important ways new mothers need care are: sleep, nutrition, gentle exercise, and time off.* . Who can come take care of mom during this period? Make a list of people with their contact information. . List some activities that make you feel cared for, rested, and energized? Who can make sure you have opportunities to do these things? . Does mom have a space of her very own within your home that's just for her? Make a "[facility]" where she can be comfortable, rest, and renew herself daily. ______________________________________________________________________________________________________________________________________________________________________________________________________________________________________________________________________________________________________________________________________________________________________________________________________    CARE FOR AND FEEDING BABY *Knowledgeable and encouraging people will offer the best support with regard to feeding your baby.* . Educate yourself and choose the best feeding option for your baby. . Make a list of people who will guide, support, and be a resource for you as your care for and feed your baby. (Friends that have breastfed or are currently breastfeeding, lactation consultants, breastfeeding support groups, etc.) . Consider a postpartum doula. (These websites can give you information: dona.org & BuyingShow.es) . Seek out local breastfeeding resources like the breastfeeding support group at [employer] or [employer]. ______________________________________________________________________________________________________________________________________________________________________________________________________________________________________________________________________________________________________________________________________________________________________________________________________  Verner Chol AND ERRANDS . Who can help with a thorough cleaning before baby is born? . Make a list of people who will help with housekeeping and chores, like laundry, light cleaning, dishes, bathrooms, etc. . Who can run some errands for you? Marland Kitchen What can you do to make sure you are stocked with basic supplies before baby is born? . Who is going to do the shopping? ______________________________________________________________________________________________________________________________________________________________________________________________________________________________________________________________________________________________________________________________________________________________________________________________________     Family Adjustment *Nurture yourselves.it helps parents be more loving and allows for better bonding with their child.* . What sorts of things do you and partner enjoy doing together? Which activities help you to connect and strengthen your relationship? Make a list of those things. Make a list of people whom you trust to care for your baby so you can have some time together as a couple. . What types of things help partner feel connected to Mom? Make a list. . What needs will partner have in order to bond with baby? . Other children? Who will care for them when you go into labor and while you are in the hospital? . Think about what the needs of your older children might be. Who can help you meet those needs? In what ways are you helping them prepare for bringing  baby home? List some specific strategies you have for family adjustment. _______________________________________________________________________________________________________________________________________________________________________________________________________________________________________________________________________________________________________________________________________________  SUPPORT *Someone who can empathize with experiences normalizes your problems and makes them more bearable.* . Make a list of other friends, neighbors, and/or co-workers you know with infants (and small children, if applicable) with whom you can connect. . Make a list of local or online support groups, mom groups, etc. in which you can be involved. ______________________________________________________________________________________________________________________________________________________________________________________________________________________________________________________________________________________________________________________________________________________________________________________________________  Childcare Plans . Investigate and plan for childcare if mom is returning to work. . Talk about mom's concerns about her transition back to work. . Talk about partner's concerns regarding this transition.  Mental Health *Your mental health is one of the highest priorities for a pregnant or postpartum mom.* . 1 in 5 women experience anxiety and/or depression from the time of conception through the first year after birth. . Postpartum Mood Disorders are the #1 complication of pregnancy and childbirth and the suffering experienced by these mothers is not  necessary! These illnesses are temporary and respond well to treatment, which often includes self-care, social support, talk therapy, and medication when needed. . Women experiencing anxiety and depression often say things  like: "I'm supposed to be happy.why do I feel so sad?", "Why can't I snap out of it?", "I'm having thoughts that scare me." . There is no need to be embarrassed if you are feeling these symptoms: o Overwhelmed, anxious, angry, sad, guilty, irritable, hopeless, exhausted but can't sleep o You are NOT alone. You are NOT to blame. With help, you WILL be well. . Where can I find help? Medical professionals such as your OB, midwife, gynecologist, family practitioner, primary care provider, pediatrician, or mental health providers; [facility] support groups: Feelings After Birth, Breastfeeding Support Group, Baby and Me Group, and Fit 4 Two exercise classes. . You have permission to ask for help. It will confirm your feelings, validate your experiences, share/learn coping strategies, and gain support and encouragement as you heal. You are important! BRAINSTORM . Make a list of local resources, including resources for mom and for partner. . Identify support groups. . Identify people to call late at night - include names and contact info. . Talk with partner about perinatal mood and anxiety disorders. . Talk with your OB, midwife, and doula about baby blues and about perinatal mood and anxiety disorders. . Talk with your pediatrician about perinatal mood and anxiety disorders.   Support & Sanity Savers   What do you really need?  . Basics . In preparing for a new baby, many expectant parents spend hours shopping for baby clothes, decorating the nursery, and deciding which car seat to buy. Yet most don't think much about what the reality of parenting a newborn will be like, and what they need to make it through that. So, here is the advice of experienced parents. We know you'll read this, and think "they're exaggerating, I don't really need that." Just trust [place] on these, OK? Plan for all of this, and if it turns out you don't need it, come back and teach [place] how you did it!  Satira Anis (Once  baby's survival needs are met, make sure you attend to your own survival needs!) . Sleep . An average newborn sleeps 16-18 hours per day, over 6-7 sleep periods, rarely more than three hours at a time. It is normal and healthy for a newborn to wake throughout the night... but really hard on parents!! . Naps. Prioritize sleep above any responsibilities like: cleaning house, visiting friends, running errands, etc.  Sleep whenever baby sleeps. If you can't nap, at least have restful times when baby eats. The more rest you get, the more patient you will be, the more emotionally stable, and better at solving problems.  . Food . You may not have realized it would be difficult to eat when you have a newborn. Yet, when we talk to . countless new parents, they say things like "it may be 2:00 pm when I realize I haven't had breakfast yet." Or "every time we sit down to dinner, baby needs to eat, and my food gets cold, so I don't bother to eat it." . Finger food. Before your baby is born, stock up with one months' worth of food that: 1) you can eat with one hand while holding a baby, 2) doesn't need to be prepped, 3) is good hot or cold, 4) doesn't spoil when left out for a few hours, and 5) you like to eat.  Think about: nuts, dried fruit, Clif bars, pretzels, jerky, gogurt, baby carrots, apples, bananas, crackers, cheez-n-crackers, string cheese, hot pockets or frozen burritos to microwave, garden burgers and breakfast pastries to put in the toaster, yogurt drinks, etc. . Restaurant Menus. Make lists of your favorite restaurants & menu items. When family/friends want to help, you can give specific information without much thought. They can either bring you the food or send gift cards for just the right meals. Rosaura Carpenter Meals.  Take some time to make a few meals to put in the freezer ahead of time.  Easy to freeze meals can be anything such as soup, lasagna, chicken pie, or spaghetti sauce. . Set up a Meal Schedule.   Ask friends and family to sign up to bring you meals during the first few weeks of being home. (It can be passed around at baby showers!) You have no idea how helpful this will be until you are in the throes of parenting.  https://hamilton-[email or web address] is a great website to check out. . Emotional Support . Know who to call when you're stressed out. Parenting a newborn is very challenging work. There are times when it totally overwhelms your normal coping abilities. EVERY NEW PARENT NEEDS TO HAVE A PLAN FOR WHO TO CALL WHEN THEY JUST CAN'T COPE ANY MORE. (And it has to be someone other than the baby's other parent!) Before your baby is born, come up with at least one person you can call for support - write their phone number down and post it on the refrigerator. Marland Kitchen Anxiety & Sadness. Baby blues are normal after pregnancy; however, there are more severe types of anxiety & sadness which can occur and should not be ignored.  They are always treatable, but you have to take the first step by reaching out for help. [facility] offers a "Mom Talk" group which meets every Tuesday from 10 am - 11 am.  This group is for new moms who need support and connection after their babies are born.  Call [phone].  Marland Kitchen Really, Really Helpful (Plan for them! Make sure these happen often!!) . Physical Support with Taking Care of Yourselves . Asking friends and family. Before your baby is born, set up a schedule of people who can come and visit and help out (or ask a friend to schedule for you). Any time someone says "let me know what I can do to help," sign them up for a day. When they get there, their job is not to take care of the baby (that's your job and your joy). Their job is to take care of you!  . Postpartum doulas. If you don't have anyone you can call on for support, look into postpartum doulas:  professionals at helping parents with caring for baby, caring for themselves, getting breastfeeding started, and helping with  household tasks. www.padanc.org is a helpful website for learning about doulas in our area. . Peer Support / Parent Groups . Why: One of the greatest ideas for new parents is to be around other new parents. Parent groups give you a chance to share and listen to others who are going through the same season of life, get a sense of what is normal infant development by watching several babies learn and grow, share your stories of triumph and struggles with empathetic ears, and forgive your own mistakes when you realize all parents are learning by trial and error. . Where to find: There are many places you can meet other  new parents throughout our community.  [facility] offers the following classes for new moms and their little ones:  Baby and Me (Birth to [place]) and Breastfeeding Support Group. Go to [email or web address] or call [phone] for more information. . Time for your Relationship . It's easy to get so caught up in meeting baby's immediate needs that it's hard to find time to connect with your partner, and meet the needs of your relationship. It's also easy to forget what "quality time with your partner" actually looks like. If you take your baby on a date, you'd be amazed how much of your couple time is spent feeding the baby, diapering the baby, admiring the baby, and talking about the baby. . Dating: Try to take time for just the two of you. Babysitter tip: Sometimes when moms are breastfeeding a newborn, they find it hard to figure out how to schedule outings around baby's unpredictable feeding schedules. Have the babysitter come for a three hour period. When she comes over, if baby has just eaten, you can leave right away, and come back in two hours. If baby hasn't fed recently, you start the date at home. Once baby gets hungry and gets a good feeding in, you can head out for the rest of your date time. . Date Nights at Home: If you can't get out, at least set aside one evening a week  to prioritize your relationship: whenever baby dozes off or doesn't have any immediate needs, spend a little time focusing on each other. . Potential conflicts: The main relationship conflicts that come up for new parents are: issues related to sexuality, financial stresses, a feeling of an unfair division of household tasks, and conflicts in parenting styles. The more you can work on these issues before baby arrives, the better!  Clint Guy and Frills (Don't forget these. and don't feel guilty for indulging in them!) . Everyone has something in life that is a fun little treat that they do just for themselves. It may be: reading the morning paper, or going for a daily jog, or having coffee with a friend once a week, or going to a movie on Friday nights, or fine chocolates, or bubble baths, or curling up with a good book. . Unless you do fun things for yourself every now and then, it's hard to have the energy for fun with your baby. Whatever your "special" treats are, make sure you find a way to continue to indulge in them after your baby is born. These special moments can recharge you, and allow you to return to baby with a new joy   PERINATAL MOOD DISORDERS: [place]   Emergency and Crisis Resources:  If you are an imminent risk to self or others, are experiencing intense personal distress, and/or have noticed significant changes in activities of daily living, call:  . 911 . [facility]: [phone] . Mobile Crisis: [phone] . National Suicide Hotline: (479)271-0045 Or visit the following crisis centers: . Local Emergency Departments . [facility]: 564 [address] [place]. Hours: 8:30AM-5PM. Insurance Accepted: Medicaid, Medicare, and Uninsured.  Marland Kitchen [place], [place] Mon-Friday 8am-3pm  [phone]  Non-Crisis Resources: To identify specific providers that are covered by your insurance, contact your insurance company or local agencies: [place]: [phone] [facility]: [phone] [place]: [phone] [employer]- [place] [phone]                                                      Outpatient therapy and medication management providers:  [facility] [phone] Hours: 9AM-5PM  Insurance Accepted: Alben Spittle, Lorella Nimrod, Freddrick March, [place], Medicare  [facility] ([place]) [phone] Hours: 8AM-5PM  nsurance Accepted: All insurances EXCEPT AARP, [place], [place], and [place]: [phone]             Hours: 8AM-8PM Insurance Accepted: Cristal Ford, Freddrick March, [place], Medicare, [phone] [facility]: [phone] Hours: 8:30AM-7PM Insurance Accepted: Cristal Ford, Medicaid, Medicare, Tricare, [employer] Counseling:  [place] Accepted:  Holland Falling, Lorella Nimrod, [facility], [place], [facility] [phone] Hours: 9AM-5:30PM Insurance Accepted: Alben Spittle, [place], and Medicaid, Medicare, [employer] [facility] Counseling:  [phone] Hours: 9am-5pm Insurance Accepted: BCBS; they do not accept Medicaid/Medicare [place]: [phone] Hours: 9am-9pm Insurance Accepted: All major insurance including Medicaid and Medicare [facility]: [phone] Hours: 9AM-5:30PM Insurance Accepted: All insurances EXCEPT Medicaid and Medicare. [facility]: [place]:  [phone] [place]:  [place] (support for children in the NICU and/or with special needs), [place] [employer]: [phone]                                                                                     Online Resources: [employer]: http://jones-berg.com/  800-944-4PPD 2Moms Supporting Moms:  www.momssupportingmoms.net

## 2019-12-14 ENCOUNTER — Other Ambulatory Visit: Payer: Self-pay | Admitting: Obstetrics and Gynecology

## 2019-12-14 ENCOUNTER — Other Ambulatory Visit: Payer: Self-pay

## 2019-12-14 ENCOUNTER — Ambulatory Visit (INDEPENDENT_AMBULATORY_CARE_PROVIDER_SITE_OTHER): Payer: Medicaid Other | Admitting: Obstetrics and Gynecology

## 2019-12-14 ENCOUNTER — Encounter: Payer: Self-pay | Admitting: Obstetrics and Gynecology

## 2019-12-14 VITALS — BP 100/65 | HR 97 | Wt 174.0 lb

## 2019-12-14 DIAGNOSIS — Z3493 Encounter for supervision of normal pregnancy, unspecified, third trimester: Secondary | ICD-10-CM

## 2019-12-14 DIAGNOSIS — Z3490 Encounter for supervision of normal pregnancy, unspecified, unspecified trimester: Secondary | ICD-10-CM

## 2019-12-14 DIAGNOSIS — F32A Depression, unspecified: Secondary | ICD-10-CM

## 2019-12-14 DIAGNOSIS — O99343 Other mental disorders complicating pregnancy, third trimester: Secondary | ICD-10-CM

## 2019-12-14 DIAGNOSIS — F329 Major depressive disorder, single episode, unspecified: Secondary | ICD-10-CM

## 2019-12-14 DIAGNOSIS — Z3A32 32 weeks gestation of pregnancy: Secondary | ICD-10-CM

## 2019-12-14 MED ORDER — SERTRALINE HCL 25 MG PO TABS: 50.0000 mg | ORAL_TABLET | Freq: Every day | ORAL | 3 refills | Status: DC

## 2019-12-14 MED ORDER — HYDROXYZINE HCL 25 MG PO TABS
25.0000 mg | ORAL_TABLET | Freq: Four times a day (QID) | ORAL | 2 refills | Status: DC | PRN
Start: 2019-12-14 — End: 2020-02-29

## 2019-12-14 NOTE — Patient Instructions (Signed)

## 2019-12-14 NOTE — Progress Notes (Signed)
   LOW-RISK PREGNANCY OFFICE VISIT Patient name: Megan Rocha MRN [id number]  Date of birth: Sep 05, 1990 Chief Complaint:   Routine Prenatal Visit  History of Present Illness:   Megan Rocha is a 29 y.o. G38P1001 female at [redacted]w[redacted]d with an Estimated Date of Delivery: 02/07/20 being seen today for ongoing management of a low-risk pregnancy.  Today she reports no complaints. Contractions: Not present. Vag. Bleeding: None.  Movement: Present. denies leaking of fluid. Review of Systems:   Pertinent items are noted in HPI Denies abnormal vaginal discharge w/ itching/odor/irritation, headaches, visual changes, shortness of breath, chest pain, abdominal pain, severe nausea/vomiting, or problems with urination or bowel movements unless otherwise stated above. Pertinent History Reviewed:  Reviewed past medical,surgical, social, obstetrical and family history.  Reviewed problem list, medications and allergies. Physical Assessment:   Vitals:   12/14/19 1318  BP: 100/65  Pulse: 97  Weight: 174 lb (78.9 kg)  Body mass index is 25.7 kg/m.        Physical Examination:   General appearance: Well appearing, and in no distress  Mental status: Alert, oriented to person, place, and time  Skin: Warm & dry  Cardiovascular: Normal heart rate noted  Respiratory: Normal respiratory effort, no distress  Abdomen: Soft, gravid, nontender  Pelvic: Cervical exam deferred         Extremities: Edema: None  Fetal Status: Fetal Heart Rate (bpm): 144 Fundal Height: 30 cm Movement: Present Presentation: Undeterminable  No results found for this or any previous visit (from the past 24 hour(s)).  Assessment & Plan:  1) Low-risk pregnancy G2P1001 at [redacted]w[redacted]d with an Estimated Date of Delivery: 02/07/20   2) Encounter for supervision of low-risk pregnancy, antepartum - Discussed GBS and cervical check at next visit - Information provided on GBS testing in pregnancy - Advised to get registered and check on hospital tours  on the [email or web address]   3) Depression affecting pregnancy in third trimester, antepartum - Started on Zoloft and Vistaril by Dr. Jolayne Panther  Meds: No orders of the defined types were placed in this encounter.  Labs/procedures today: none  Plan:  Continue routine obstetrical care   Reviewed: Preterm labor symptoms and general obstetric precautions including but not limited to vaginal bleeding, contractions, leaking of fluid and fetal movement were reviewed in detail with the patient.  All questions were answered. Has home bp cuff. Check bp weekly, let us know if >140/90.   Follow-up: Return in about 4 weeks (around 01/11/2020) for Return OB w/GBS.  No orders of the defined types were placed in this encounter.  Raelyn Mora MSN, CNM 12/14/2019 1:30 PM

## 2019-12-21 ENCOUNTER — Telehealth: Payer: Self-pay | Admitting: Clinical

## 2019-12-21 NOTE — Telephone Encounter (Signed)
Integrated Behavioral Health Medication Management Phone Note  MRN: [id number] NAME: Megan Rocha  Time Call Initiated: 9:58 Time Call Completed: 10:01 Total Call Time: 3  Current Medications:  Outpatient Medications Prior to Visit  Medication Sig Dispense Refill   Blood Pressure Monitoring (BLOOD PRESSURE KIT) DEVI 1 Device by Does not apply route daily. ICD 10: Z34.00 1 each 0   doxylamine, Sleep, (UNISOM) 25 MG tablet Take 1 tablet (25 mg total) by mouth at bedtime as needed. 30 tablet 2   hydrOXYzine (ATARAX/VISTARIL) 25 MG tablet Take 1 tablet (25 mg total) by mouth every 6 (six) hours as needed for anxiety. 30 tablet 2   ondansetron (ZOFRAN ODT) 8 MG disintegrating tablet Take 1 tablet (8 mg total) by mouth every 8 (eight) hours as needed for nausea or vomiting. (Patient not taking: Reported on 09/15/2019) 30 tablet 1   Prenatal Vit-Fe Fumarate-FA (PRENATAL VITAMINS PO) Take 1 tablet by mouth daily.     promethazine (PHENERGAN) 12.5 MG tablet Take 1 tablet (12.5 mg total) by mouth every 6 (six) hours as needed for nausea or vomiting. 30 tablet 1   sertraline (ZOLOFT) 25 MG tablet Take 2 tablets (50 mg total) by mouth daily. 30 tablet 3   No facility-administered medications prior to visit.    Patient has been able to get all medications filled as prescribed: No; Will pick up today   Garlan Fair, LCSW

## 2020-01-09 NOTE — BH Specialist Note (Signed)
Integrated Behavioral Health via Telemedicine Video (Caregility) Visit  01/09/2020 Megan Rocha [id number]  Number of Integrated Behavioral Health visits: 6 Session Start time: 10:52  Session End time: 11:16 Total time: 24  Referring Provider: Donia Ast, NP Type of Visit: Video Patient/Family location: [facility] Provider location: [facility]  All persons participating in visit: Patient Megan Rocha and [facility]    Confirmed patient's address: Yes  Confirmed patient's phone number: Yes  Any changes to demographics: No   Confirmed patient's insurance: Yes  Any changes to patient's insurance: No   Discussed confidentiality: at previous visi  I connected with Megan Rocha by a video enabled telemedicine application (Caregility) and verified that I am speaking with the correct person using two identifiers.     I discussed the limitations of evaluation and management by telemedicine and the availability of in person appointments.  I discussed that the purpose of this visit is to provide behavioral health care while limiting exposure to the novel coronavirus.   Discussed there is a possibility of technology failure and discussed alternative modes of communication if that failure occurs.  I discussed that engaging in this virtual visit, they consent to the provision of behavioral healthcare and the services will be billed under their insurance.  Patient and/or legal guardian expressed understanding and consented to virtual visit: Yes   PRESENTING CONCERNS: Patient and/or family reports the following symptoms/concerns: Pt states her primary symptoms today are lack of quality sleep and irritability, attributed to increasing discomfort in late pregnancy; pt is coping best by staying busy with new puppy, getting outside, and time in pool/sleeping upright has helped ease some discomfort.  Duration of problem: Increase in  current pregnancy; Severity of problem: moderate  STRENGTHS (Protective Factors/Coping Skills): Strong self-awareness, motivated for change,; supportive family   GOALS ADDRESSED: Patient will: 1.  Reduce symptoms of: anxiety, depression and stress  2.  Demonstrate ability to: Increase healthy adjustment to current life circumstances  INTERVENTIONS: Interventions utilized:  Solution-Focused Strategies and Psychoeducation and/or Health Education Standardized Assessments completed: not given today  ASSESSMENT: Patient currently experiencing Mood disorder.   Patient may benefit from continued psychoeducation and brief therapeutic interventions regarding coping with symptoms of anxiety and depression .  PLAN: 1. Follow up with behavioral health clinician on : Postpartum 2. Behavioral recommendations:  -Consider starting Zoloft postpartum (not taking in pregnancy), as prescribed -Continue taking prenatal vitamin daily until postpartum medical visit -Continue using daily self-coping strategies that are helping ease discomfort and giving structure and distraction to each day before birth (walking and caring for puppy, time outdoors, time in water, naps sitting upright in chair, etc.) 3. Referral(s): Integrated [employer] (In Clinic)  I discussed the assessment and treatment plan with the patient and/or parent/guardian. They were provided an opportunity to ask questions and all were answered. They agreed with the plan and demonstrated an understanding of the instructions.   They were advised to call back or seek an in-person evaluation if the symptoms worsen or if the condition fails to improve as anticipated.  [name]  Depression screen [facility] 2/9 12/14/2019 12/09/2019 11/16/2019 08/17/2019 08/10/2019  Decreased Interest 2 3 2 2  0  Down, Depressed, Hopeless 2 2 1 1  0  PHQ - 2 Score 4 5 3 3  0  Altered sleeping 3 1 3 3 3   Tired, decreased energy 3 3 1 3 3   Change in  appetite 2 2 2  3  3  Feeling bad or failure about yourself  2 1 0 1 0  Trouble concentrating 2 2 1  0 0  Moving slowly or fidgety/restless 0 0 0 0 0  Suicidal thoughts 0 0 0 0 0  PHQ-9 Score 16 14 10 13 9    GAD 7 : Generalized Anxiety Score 12/14/2019 12/09/2019 11/16/2019 08/17/2019  Nervous, Anxious, on Edge 2 1 2 1   Control/stop worrying 2 2 2 1   Worry too much - different things 2 2 2 1   Trouble relaxing 3 3 2 2   Restless 2 1 2 1   Easily annoyed or irritable 3 3 2 2   Afraid - awful might happen 2 3 2 1   Total GAD 7 Score 16 15 14  9

## 2020-01-11 ENCOUNTER — Other Ambulatory Visit: Payer: Self-pay

## 2020-01-11 ENCOUNTER — Inpatient Hospital Stay (HOSPITAL_COMMUNITY)
Admission: AD | Admit: 2020-01-11 | Discharge: 2020-01-11 | Disposition: A | Discharge status: Home / Self Care | Payer: Medicaid Other | Attending: Obstetrics & Gynecology | Admitting: Obstetrics & Gynecology

## 2020-01-11 ENCOUNTER — Ambulatory Visit (INDEPENDENT_AMBULATORY_CARE_PROVIDER_SITE_OTHER): Payer: Medicaid Other | Admitting: Women's Health

## 2020-01-11 ENCOUNTER — Other Ambulatory Visit (HOSPITAL_COMMUNITY)
Admission: RE | Admit: 2020-01-11 | Discharge: 2020-01-11 | Disposition: A | Discharge status: Home / Self Care | Payer: Medicaid Other | Source: Ambulatory Visit | Attending: Women's Health | Admitting: Women's Health

## 2020-01-11 ENCOUNTER — Encounter (HOSPITAL_COMMUNITY): Payer: Self-pay | Admitting: Obstetrics & Gynecology

## 2020-01-11 VITALS — BP 103/66 | HR 80 | Wt 180.0 lb

## 2020-01-11 DIAGNOSIS — IMO0001 Reserved for inherently not codable concepts without codable children: Secondary | ICD-10-CM

## 2020-01-11 DIAGNOSIS — Z3493 Encounter for supervision of normal pregnancy, unspecified, third trimester: Secondary | ICD-10-CM

## 2020-01-11 DIAGNOSIS — B9689 Other specified bacterial agents as the cause of diseases classified elsewhere: Secondary | ICD-10-CM

## 2020-01-11 DIAGNOSIS — O3443 Maternal care for other abnormalities of cervix, third trimester: Secondary | ICD-10-CM

## 2020-01-11 DIAGNOSIS — R87810 Cervical high risk human papillomavirus (HPV) DNA test positive: Secondary | ICD-10-CM

## 2020-01-11 DIAGNOSIS — O26893 Other specified pregnancy related conditions, third trimester: Secondary | ICD-10-CM | POA: Diagnosis not present

## 2020-01-11 DIAGNOSIS — D696 Thrombocytopenia, unspecified: Secondary | ICD-10-CM

## 2020-01-11 DIAGNOSIS — O99113 Other diseases of the blood and blood-forming organs and certain disorders involving the immune mechanism complicating pregnancy, third trimester: Secondary | ICD-10-CM

## 2020-01-11 DIAGNOSIS — Z3A36 36 weeks gestation of pregnancy: Secondary | ICD-10-CM | POA: Diagnosis not present

## 2020-01-11 DIAGNOSIS — Z8619 Personal history of other infectious and parasitic diseases: Secondary | ICD-10-CM | POA: Diagnosis not present

## 2020-01-11 DIAGNOSIS — N898 Other specified noninflammatory disorders of vagina: Secondary | ICD-10-CM | POA: Diagnosis not present

## 2020-01-11 DIAGNOSIS — O23593 Infection of other part of genital tract in pregnancy, third trimester: Secondary | ICD-10-CM

## 2020-01-11 DIAGNOSIS — O99343 Other mental disorders complicating pregnancy, third trimester: Secondary | ICD-10-CM

## 2020-01-11 DIAGNOSIS — Z0371 Encounter for suspected problem with amniotic cavity and membrane ruled out: Secondary | ICD-10-CM

## 2020-01-11 DIAGNOSIS — F329 Major depressive disorder, single episode, unspecified: Secondary | ICD-10-CM

## 2020-01-11 DIAGNOSIS — O358XX0 Maternal care for other (suspected) fetal abnormality and damage, not applicable or unspecified: Secondary | ICD-10-CM

## 2020-01-11 DIAGNOSIS — R87610 Atypical squamous cells of undetermined significance on cytologic smear of cervix (ASC-US): Secondary | ICD-10-CM

## 2020-01-11 DIAGNOSIS — Z880 Allergy status to penicillin: Secondary | ICD-10-CM | POA: Diagnosis not present

## 2020-01-11 LAB — WET PREP, GENITAL
Clue Cells Wet Prep HPF POC: NONE SEEN
Sperm: NONE SEEN
Trich, Wet Prep: NONE SEEN
Yeast Wet Prep HPF POC: NONE SEEN

## 2020-01-11 LAB — POCT FERN TEST: POCT Fern Test: NEGATIVE

## 2020-01-11 NOTE — Patient Instructions (Addendum)
Maternity Assessment Unit (MAU)  The Maternity Assessment Unit (MAU) is located at the [facility]. The address is: [address] [place], [place], [place] [address]. Please see map below for additional directions.    The Maternity Assessment Unit is designed to help you during your pregnancy, and for up to 6 weeks after delivery, with any pregnancy- or postpartum-related emergencies, if you think you are in labor, or if your water has broken. For example, if you experience nausea and vomiting, vaginal bleeding, severe abdominal or pelvic pain, elevated blood pressure or other problems related to your pregnancy or postpartum time, please come to the Maternity Assessment Unit for assistance.     Signs and Symptoms of Labor Labor is your body's natural process of moving your baby, placenta, and umbilical cord out of your uterus. The process of labor usually starts when your baby is full-term, between 33 and 40 weeks of pregnancy. How will I know when I am close to going into labor? As your body prepares for labor and the birth of your baby, you may notice the following symptoms in the weeks and days before true labor starts:  Having a strong desire to get your home ready to receive your new baby. This is called nesting. Nesting may be a sign that labor is approaching, and it may occur several weeks before birth. Nesting may involve cleaning and organizing your home.  Passing a small amount of thick, bloody mucus out of your vagina (normal bloody show or losing your mucus plug). This may happen more than a week before labor begins, or it might occur right before labor begins as the opening of the cervix starts to widen (dilate). For some women, the entire mucus plug passes at once. For others, smaller portions of the mucus plug may gradually pass over several days.  Your baby moving (dropping) lower in your pelvis to get into position for birth  (lightening). When this happens, you may feel more pressure on your bladder and pelvic bone and less pressure on your ribs. This may make it easier to breathe. It may also cause you to need to urinate more often and have problems with bowel movements.  Having "practice contractions" (Braxton Hicks contractions) that occur at irregular (unevenly spaced) intervals that are more than 10 minutes apart. This is also called false labor. False labor contractions are common after exercise or sexual activity, and they will stop if you change position, rest, or drink fluids. These contractions are usually mild and do not get stronger over time. They may feel like: ? A backache or back pain. ? Mild cramps, similar to menstrual cramps. ? Tightening or pressure in your abdomen. Other early symptoms that labor may be starting soon include:  Nausea or loss of appetite.  Diarrhea.  Having a sudden burst of energy, or feeling very tired.  Mood changes.  Having trouble sleeping. How will I know when labor has begun? Signs that true labor has begun may include:  Having contractions that come at regular (evenly spaced) intervals and increase in intensity. This may feel like more intense tightening or pressure in your abdomen that moves to your back. ? Contractions may also feel like rhythmic pain in your upper thighs or back that comes and goes at regular intervals. ? For first-time mothers, this change in intensity of contractions often occurs at a more gradual pace. ? Women who have given birth before may notice a more rapid  progression of contraction changes.  Having a feeling of pressure in the vaginal area.  Your water breaking (rupture of membranes). This is when the sac of fluid that surrounds your baby breaks. When this happens, you will notice fluid leaking from your vagina. This may be clear or blood-tinged. Labor usually starts within 24 hours of your water breaking, but it may take longer to  begin. ? Some women notice this as a gush of fluid. ? Others notice that their underwear repeatedly becomes damp. Follow these instructions at home:   When labor starts, or if your water breaks, call your health care provider or nurse care line. Based on your situation, they will determine when you should go in for an exam.  When you are in early labor, you may be able to rest and manage symptoms at home. Some strategies to try at home include: ? Breathing and relaxation techniques. ? Taking a warm bath or shower. ? Listening to music. ? Using a heating pad on the lower back for pain. If you are directed to use heat:  Place a towel between your skin and the heat source.  Leave the heat on for 20-30 minutes.  Remove the heat if your skin turns bright red. This is especially important if you are unable to feel pain, heat, or cold. You may have a greater risk of getting burned. Get help right away if:  You have painful, regular contractions that are 5 minutes apart or less.  Labor starts before you are [redacted] weeks along in your pregnancy.  You have a fever.  You have a headache that does not go away.  You have bright red blood coming from your vagina.  You do not feel your baby moving.  You have a sudden onset of: ? Severe headache with vision problems. ? Nausea, vomiting, or diarrhea. ? Chest pain or shortness of breath. These symptoms may be an emergency. If your health care provider recommends that you go to the hospital or birth center where you plan to deliver, do not drive yourself. Have someone else drive you, or call emergency services (911 in the [place] Summary  Labor is your body's natural process of moving your baby, placenta, and umbilical cord out of your uterus.  The process of labor usually starts when your baby is full-term, between 3037 and 40 weeks of pregnancy.  When labor starts, or if your water breaks, call your health care provider or nurse care line. Based  on your situation, they will determine when you should go in for an exam. This information is not intended to replace advice given to you by your health care provider. Make sure you discuss any questions you have with your health care provider. Document Revised: 03/09/2017 Document Reviewed: 11/14/2016 Elsevier Patient Education  2020 Elsevier Inc.        Group B Streptococcus Test During Pregnancy Why am I having this test? Routine testing, also called screening, for group B streptococcus (GBS) is recommended for all pregnant women between the 36th and 37th week of pregnancy. GBS is a type of bacteria that can be passed from mother to baby during childbirth. Screening will help guide whether or not you will need treatment during labor and delivery to prevent complications such as:  An infection in your uterus during labor.  An infection in your uterus after delivery.  A serious infection in your baby after delivery, such as pneumonia, meningitis, or sepsis. GBS screening is not often done before  36 weeks of pregnancy unless you go into labor prematurely. What happens if I have group B streptococcus? If testing shows that you have GBS, your health care provider will recommend treatment with IV antibiotics during labor and delivery. This treatment significantly decreases the risk of complications for you and your baby. If you have a planned C-section and you have GBS, you may not need to be treated with antibiotics because GBS is usually passed to babies after labor starts and your water breaks. If you are in labor or your water breaks before your C-section, it is possible for GBS to get into your uterus and be passed to your baby, so you might need treatment. Is there a chance I may not need to be tested? You may not need to be tested for GBS if:  You have a urine test that shows GBS before 36 to 37 weeks.  You had a baby with GBS infection after a previous delivery. In these cases,  you will automatically be treated for GBS during labor and delivery. What is being tested? This test is done to check if you have group B streptococcus in your vagina or rectum. What kind of sample is taken? To collect samples for this test, your health care provider will swab your vagina and rectum with a cotton swab. The sample is then sent to the lab to see if GBS is present. What happens during the test?   You will remove your clothing from the waist down.  You will lie down on an exam table in the same position as you would for a pelvic exam.  Your health care provider will swab your vagina and rectum to collect samples for a culture test.  You will be able to go home after the test and do all your usual activities. How are the results reported? The test results are reported as positive or negative. What do the results mean?  A positive test means you are at risk for passing GBS to your baby during labor and delivery. Your health care provider will recommend that you are treated with an IV antibiotic during labor and delivery.  A negative test means you are at very low risk of passing GBS to your baby. There is still a low risk of passing GBS to your baby because sometimes test results may report that you do not have a condition when you do (false-negative result) or there is a chance that you may become infected with GBS after the test is done. You most likely will not need to be treated with an antibiotic during labor and delivery. Talk with your health care provider about what your results mean. Questions to ask your health care provider Ask your health care provider, or the department that is doing the test:  When will my results be ready?  How will I get my results?  What are my treatment options? Summary  Routine testing (screening) for group B streptococcus (GBS) is recommended for all pregnant women between the 36th and 37th week of pregnancy.  GBS is a type of  bacteria that can be passed from mother to baby during childbirth.  If testing shows that you have GBS, your health care provider will recommend that you are treated with IV antibiotics during labor and delivery. This treatment almost always prevents infection in newborns. This information is not intended to replace advice given to you by your health care provider. Make sure you discuss any questions you have with your  health care provider. Document Revised: 09/30/2018 Document Reviewed: 07/07/2018 Elsevier Patient Education  2020 [employer].        Contraception Choices - [email or web address].[email or web address] Contraception, also called birth control, refers to methods or devices that prevent pregnancy. Hormonal methods Contraceptive implant  A contraceptive implant is a thin, plastic tube that contains a hormone. It is inserted into the upper part of the arm. It can remain in place for up to 3 years. Progestin-only injections Progestin-only injections are injections of progestin, a synthetic form of the hormone progesterone. They are given every 3 months by a health care provider. Birth control pills  Birth control pills are pills that contain hormones that prevent pregnancy. They must be taken once a day, preferably at the same time each day. Birth control patch  The birth control patch contains hormones that prevent pregnancy. It is placed on the skin and must be changed once a week for three weeks and removed on the fourth week. A prescription is needed to use this method of contraception. Vaginal ring  A vaginal ring contains hormones that prevent pregnancy. It is placed in the vagina for three weeks and removed on the fourth week. After that, the process is repeated with a new ring. A prescription is needed to use this method of contraception. Emergency contraceptive Emergency contraceptives prevent pregnancy after unprotected sex. They come in pill form and can be taken up to 5 days after sex.  They work best the sooner they are taken after having sex. Most emergency contraceptives are available without a prescription. This method should not be used as your only form of birth control. Barrier methods Female condom  A female condom is a thin sheath that is worn over the penis during sex. Condoms keep sperm from going inside a woman's body. They can be used with a spermicide to increase their effectiveness. They should be disposed after a single use. Female condom  A female condom is a soft, loose-fitting sheath that is put into the vagina before sex. The condom keeps sperm from going inside a woman's body. They should be disposed after a single use. Diaphragm  A diaphragm is a soft, dome-shaped barrier. It is inserted into the vagina before sex, along with a spermicide. The diaphragm blocks sperm from entering the uterus, and the spermicide kills sperm. A diaphragm should be left in the vagina for 6-8 hours after sex and removed within 24 hours. A diaphragm is prescribed and fitted by a health care provider. A diaphragm should be replaced every 1-2 years, after giving birth, after gaining more than 15 lb (6.8 kg), and after pelvic surgery. Cervical cap  A cervical cap is a round, soft latex or plastic cup that fits over the cervix. It is inserted into the vagina before sex, along with spermicide. It blocks sperm from entering the uterus. The cap should be left in place for 6-8 hours after sex and removed within 48 hours. A cervical cap must be prescribed and fitted by a health care provider. It should be replaced every 2 years. Sponge  A sponge is a soft, circular piece of polyurethane foam with spermicide on it. The sponge helps block sperm from entering the uterus, and the spermicide kills sperm. To use it, you make it wet and then insert it into the vagina. It should be inserted before sex, left in for at least 6 hours after sex, and removed and thrown away within 30  hours. Spermicides Spermicides are chemicals that kill or  block sperm from entering the cervix and uterus. They can come as a cream, jelly, suppository, foam, or tablet. A spermicide should be inserted into the vagina with an applicator at least 10-15 minutes before sex to allow time for it to work. The process must be repeated every time you have sex. Spermicides do not require a prescription. Intrauterine contraception Intrauterine device (IUD) An IUD is a T-shaped device that is put in a woman's uterus. There are two types:  Hormone IUD.This type contains progestin, a synthetic form of the hormone progesterone. This type can stay in place for 3-5 years.  Copper IUD.This type is wrapped in copper wire. It can stay in place for 10 years.  Permanent methods of contraception Female tubal ligation In this method, a woman's fallopian tubes are sealed, tied, or blocked during surgery to prevent eggs from traveling to the uterus. Hysteroscopic sterilization In this method, a small, flexible insert is placed into each fallopian tube. The inserts cause scar tissue to form in the fallopian tubes and block them, so sperm cannot reach an egg. The procedure takes about 3 months to be effective. Another form of birth control must be used during those 3 months. Female sterilization This is a procedure to tie off the tubes that carry sperm (vasectomy). After the procedure, the man can still ejaculate fluid (semen). Natural planning methods Natural family planning In this method, a couple does not have sex on days when the woman could become pregnant. Calendar method This means keeping track of the length of each menstrual cycle, identifying the days when pregnancy can happen, and not having sex on those days. Ovulation method In this method, a couple avoids sex during ovulation. Symptothermal method This method involves not having sex during ovulation. The woman typically checks for ovulation by watching  changes in her temperature and in the consistency of cervical mucus. Post-ovulation method In this method, a couple waits to have sex until after ovulation. Summary  Contraception, also called birth control, means methods or devices that prevent pregnancy.  Hormonal methods of contraception include implants, injections, pills, patches, vaginal rings, and emergency contraceptives.  Barrier methods of contraception can include female condoms, female condoms, diaphragms, cervical caps, sponges, and spermicides.  There are two types of IUDs (intrauterine devices). An IUD can be put in a woman's uterus to prevent pregnancy for 3-5 years.  Permanent sterilization can be done through a procedure for males, females, or both.  Natural family planning methods involve not having sex on days when the woman could become pregnant. This information is not intended to replace advice given to you by your health care provider. Make sure you discuss any questions you have with your health care provider. Document Revised: 06/11/2017 Document Reviewed: 07/12/2016 Elsevier Patient Education  2020 Elsevier Inc.       AREA PEDIATRIC/FAMILY PRACTICE PHYSICIANS  [facility] [address] [place], [place] [address] Phone - [phone]   Fax - [phone]  JACK AMOS [address] [place], [place]  [address] Phone - [phone]   Fax - [phone]  [facility] [address] [place], [place]  [address] Phone - [phone]   Fax - [phone]  [facility] [address] [place], [place]  [address] Phone - [phone]   Fax - [phone]  [facility] [address] [place], [place]  [address] Phone - [phone]   Fax - [phone]  [facility] [phone]  [address] [place], [place]  [address] Phone - [phone]   Fax - [phone]  [facility] [place] [address] [place], [place]  [address] Phone - [phone]   Fax - [phone]  [facility] AT [facility] [address] [place], [address] [place], [place]  [address] Phone - [phone]   Fax - [phone]  [facility] AT [facility] [address] [place], [place]  [address] Phone - [phone]   Fax - [phone] [facility] [place] [address] [place], [place]  [address] Phone - [phone]   Fax - [phone]  [facility] [address] [place], [place]  [address] Phone - [phone]   Fax - [phone]  [facility] [address], [address] [place], [place]  [address] Phone - [phone]   Fax - [phone]  [facility] [place] [address] [place], [place]  [address] Phone - [phone]   Fax - [phone]  [facility] [address] [place], [place]  [address] Phone - [phone]  [facility] [address] [place], [place]  [address] Phone - [phone]   Fax - [phone]  [facility] [address] [place], [place]  [address] Phone - [phone]   Fax - [phone]  [facility] [address] [place], [place]  [address] Phone - [phone]   Fax - [phone]  [facility] [address] [place], [place]  [address] Phone - [phone]   Fax - [phone]   [facility] [address] [place], [place]  [address] Phone - [phone]   Fax - [phone]  [facility] [address] [place], [place]  [address] Phone - [phone]   Fax - [phone]  DAVID RUBIN [address] [place], [place]  [address] Phone - [phone]   Fax - [phone]  [facility] [address] [place], [place]  [address] Phone - [phone]   Fax -  [phone]  [place] [facility] Alita Chyle [address] [place], [place]  [address] Phone - [phone]   Fax - [phone] Gerarda Fraction [address] [place], [place]  [address] Phone - [phone]   Fax - [phone]  [facility] [address] [place], [place]  [address] Phone - [phone]   Fax - [phone]  [facility] - [place] [address] [place], [place]  [address] Phone - [phone]   Fax - [phone]         https://www.cdc.gov/vaccines/hcp/vis/vis-statements/tdap.pdf">  Tdap (Tetanus, Diphtheria, Pertussis) Vaccine: What You Need to Know 1. Why get vaccinated? Tdap vaccine can prevent tetanus, diphtheria, and pertussis. Diphtheria and pertussis spread from person to person. Tetanus enters the body through cuts or wounds.  TETANUS (T) causes painful stiffening of the muscles. Tetanus can lead to serious health problems, including being unable to open the mouth, having trouble swallowing and breathing, or death.  DIPHTHERIA (D) can lead to difficulty breathing, heart failure, paralysis, or death.  PERTUSSIS (aP), also known as "whooping cough," can cause uncontrollable, violent coughing which makes it hard to breathe, eat, or drink. Pertussis can be extremely serious in babies and young children, causing pneumonia, convulsions, brain damage, or death. In teens and adults, it can cause weight loss, loss of bladder control, passing out, and rib fractures from  severe coughing. 2. Tdap vaccine Tdap is only for children 7 years and older, adolescents, and adults.  Adolescents should receive a single dose of Tdap, preferably at age 68 or 12 years. Pregnant women should get a dose of Tdap during every pregnancy, to protect the newborn from pertussis. Infants are most at risk for severe, life-threatening complications from pertussis. Adults who have never received Tdap should get a dose of Tdap. Also,  adults should receive a booster dose every 10 years, or earlier in the case of a severe and dirty wound or burn. Booster doses can be either Tdap or Td (a different vaccine that protects against tetanus and diphtheria but not pertussis). Tdap may be given at the same time as other vaccines. 3. Talk with your health care provider Tell your vaccine provider if the person getting the vaccine:  Has had an allergic reaction after a previous dose of any vaccine that protects against tetanus, diphtheria, or pertussis, or has any severe, life-threatening allergies.  Has had a coma, decreased level of consciousness, or prolonged seizures within 7 days after a previous dose of any pertussis vaccine (DTP, DTaP, or Tdap).  Has seizures or another nervous system problem.  Has ever had Guillain-Barr Syndrome (also called GBS).  Has had severe pain or swelling after a previous dose of any vaccine that protects against tetanus or diphtheria. In some cases, your health care provider may decide to postpone Tdap vaccination to a future visit.  People with minor illnesses, such as a cold, may be vaccinated. People who are moderately or severely ill should usually wait until they recover before getting Tdap vaccine.  Your health care provider can give you more information. 4. Risks of a vaccine reaction  Pain, redness, or swelling where the shot was given, mild fever, headache, feeling tired, and nausea, vomiting, diarrhea, or stomachache sometimes happen after Tdap vaccine. People sometimes faint after medical procedures, including vaccination. Tell your provider if you feel dizzy or have vision changes or ringing in the ears.  As with any medicine, there is a very remote chance of a vaccine causing a severe allergic reaction, other serious injury, or death. 5. What if there is a serious problem? An allergic reaction could occur after the vaccinated person leaves the clinic. If you see signs of a severe allergic  reaction (hives, swelling of the face and throat, difficulty breathing, a fast heartbeat, dizziness, or weakness), call 9-1-1 and get the person to the nearest hospital. For other signs that concern you, call your health care provider.  Adverse reactions should be reported to the Vaccine Adverse Event Reporting System (VAERS). Your health care provider will usually file this report, or you can do it yourself. Visit the VAERS website at www.vaers.LAgents.no or call 301-091-4042. VAERS is only for reporting reactions, and VAERS staff do not give medical advice. 6. The National Vaccine Injury Compensation Program The [employer] Vaccine Injury Compensation Program (VICP) is a federal program that was created to compensate people who may have been injured by certain vaccines. Visit the VICP website at SpiritualWord.at or call (386) 870-1758 to learn about the program and about filing a claim. There is a time limit to file a claim for compensation. 7. How can I learn more?  Ask your health care provider.  Call your local or state health department.  Contact the Centers for Disease Control and Prevention (CDC): ? Call 413-330-6103 (1-800-CDC-INFO) or ? Visit CDC's website at PicCapture.uy Vaccine Information Statement Tdap (Tetanus, Diphtheria, Pertussis) Vaccine (  09/22/2018) This information is not intended to replace advice given to you by your health care provider. Make sure you discuss any questions you have with your health care provider. Document Revised: 10/01/2018 Document Reviewed: 10/04/2018 Elsevier Patient Education  2020 [employer].

## 2020-01-11 NOTE — MAU Note (Signed)
Pt has noticed clear to frothy vaginal  discharge for past few weeks, with no odor. Is here for SROM eval- sent from clinic. Denies cntx, VB, +FM.

## 2020-01-11 NOTE — Progress Notes (Addendum)
Subjective:  Megan Rocha is a 29 y.o. G2P1001 at [redacted]w[redacted]d being seen today for ongoing prenatal care.  She is currently monitored for the following issues for this low-risk pregnancy and has Supervision of low-risk pregnancy; Fetal cardiac echogenic focus; ASCUS with positive high risk HPV cervical; Gestational thrombocytopenia (HCC); and Depression affecting pregnancy in third trimester, antepartum on their problem list.  Patient reports watery vaginal discharge. Patient describes the discharge usually looks like water and is copious. Patient denies wearing a pad during the day. Patient reports this clear fluid soaks her underwear and pants that requires a change of clothing. Patient reports fluid is odorless and occurs randomly throughout the day.  Contractions: Irritability. Vag. Bleeding: None.  Movement: Present. Denies leaking of fluid.   The following portions of the patient's history were reviewed and updated as appropriate: allergies, current medications, past family history, past medical history, past social history, past surgical history and problem list. Problem list updated.  Objective:   Vitals:   01/11/20 1524  BP: 103/66  Pulse: 80  Weight: 180 lb (81.6 kg)    Fetal Status: Fetal Heart Rate (bpm): 136 Fundal Height: 35 cm Movement: Present  Presentation: Undeterminable  General:  Alert, oriented and cooperative. Patient is in no acute distress.  Skin: Skin is warm and dry. No rash noted.   Cardiovascular: Normal heart rate noted  Respiratory: Normal respiratory effort, no problems with respiration noted  Abdomen: Soft, gravid, appropriate for gestational age. Pain/Pressure: Present     Pelvic: Vag. Bleeding: None Vag D/C Character: Watery  On SSE, minimal, frothy, white discharge noted, no pooling or leaking of fluid from os. Cervical exam performed Dilation: 1.5 Effacement (%): 50 Station: Ballotable  Extremities: Normal range of motion.  Edema: Trace  Mental Status: Normal  mood and affect. Normal behavior. Normal judgment and thought content.   Urinalysis:      Assessment and Plan:  Pregnancy: G2P1001 at [redacted]w[redacted]d  1. Encounter for supervision of low-risk pregnancy in third trimester - Culture, beta strep (group b only) + Reflex d/t PCN allergy - Cervicovaginal ancillary only( [place]) - discussed Tdap, pt declines - discussed contraception, pt elects none, information given - peds list given  2. Fetal cardiac echogenic focus, single or unspecified fetus - no f/u needed per MFM - NIPS testing not performed  3. Depression affecting pregnancy in third trimester, antepartum - Started on Zoloft and Vistaril by Dr. Jolayne Panther, patient reports she has not started taking medications because she discussed with her boyfriend and he does not want her to take medication while she is pregnant. Discussed R/B with treated and untreated anxiety and depression in pregnancy and patient encouraged to discuss medication use with Marijean Niemann at next visit. - Next [facility] appt 01/16/2020  4. Benign gestational thrombocytopenia in third trimester (HCC) - CBC  5. ASCUS with positive high risk HPV cervical - 09/2019 - ASCUS, +HPV - 10/2019 - colpo CIN I, repeat co-testing 10/2020  6. Vaginal discharge during pregnancy in third trimester - Cervicovaginal ancillary only( [place]) - patient to MAU for AmniSure, MAU Provider notified, pt to proceed immediately to MAU for evaluation after office lab work and visit   Term labor symptoms and general obstetric precautions including but not limited to vaginal bleeding, contractions, leaking of fluid and fetal movement were reviewed in detail with the patient. Please refer to After Visit Summary for other counseling recommendations.  I discussed the assessment and treatment plan with the patient. The patient was provided an  opportunity to ask questions and all were answered. The patient agreed with the plan and demonstrated an understanding of  the instructions. The patient was advised to call back or seek an in-person office evaluation/go to MAU at [facility] for any urgent or concerning symptoms.  Return in about 1 week (around 01/18/2020) for virtual ROB/APP OK.   Chayil Gantt, Odie Sera, NP

## 2020-01-11 NOTE — MAU Provider Note (Signed)
Chief Complaint:  Rupture of Membranes   First Provider Initiated Contact with Patient 01/11/20 1727      HPI: Megan Rocha is a 29 y.o. G2P1001 at [id number] LMP who presents to maternity admissions sent from the office for leaking fluid x 1 week. She reports watery discharge daily requiring pantyliners but not pads. She denies any painful or regular cramping/contractions.  There is some pink bleeding with onset after her cervical exam in the office today.  There are no other s/sx. She has not tried any treatments. She reports good fetal movement.    HPI  Past Medical History: Past Medical History:  Diagnosis Date  . BV (bacterial vaginosis)   . PONV (postoperative nausea and vomiting)   . UTI (urinary tract infection)   . Yeast vaginitis     Past obstetric history: OB History  Gravida Para Term Preterm AB Living  '2 1 1     1  ' SAB TAB Ectopic Multiple Live Births          1    # Outcome Date GA Lbr Len/2nd Weight Sex Delivery Anes PTL Lv  2 Current           1 Term 2015 [id number] g M Vag-Spont EPI  LIV     Birth Comments: wnl    Past Surgical History: Past Surgical History:  Procedure Laterality Date  . colposcopy      Family History: Family History  Problem Relation Age of Onset  . Breast cancer Paternal Grandmother     Social History: Social History   Tobacco Use  . Smoking status: Never Smoker  . Smokeless tobacco: Never Used  Vaping Use  . Vaping Use: Never used  Substance Use Topics  . Alcohol use: Not Currently    Comment: occasional   . 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 Penicillins Anaphylaxis    Meds:  No medications prior to admission.    ROS:  Review of Systems   I have reviewed patient's Past Medical Hx, Surgical Hx, Family Hx, Social Hx, medications and allergies.   Physical Exam   Patient Vitals for the past 24 hrs:  BP Temp  Temp src Pulse Resp SpO2  01/11/20 1947 104/65 -- -- -- -- --  01/11/20 1911 104/65 -- -- 80 -- --  01/11/20 1750 -- -- -- -- -- 100 %  01/11/20 1745 -- -- -- -- -- 100 %  01/11/20 1718 102/66 98.4 F (36.9 C) Oral 95 18 100 %   Constitutional: Well-developed, well-nourished female in no acute distress.  Cardiovascular: normal rate Respiratory: normal effort GI: Abd soft, non-tender, gravid appropriate for gestational age.  MS: Extremities nontender, no edema, normal ROM Neurologic: Alert and oriented x 4.  GU: Neg CVAT.  PELVIC EXAM: Cervix pink, visually 1 cm, without lesion, small amount mucus mixed with scant dark red bleeding, vaginal walls and external genitalia normal      FHT:  Baseline 130, moderate variability, accelerations present, no decelerations Contractions: q  mins   Labs: Results for orders placed or performed during the hospital encounter of 01/11/20 (from the past 24 hour(s))  Wet prep, genital     Status: Abnormal   Collection Time: 01/11/20  5:30 PM   Specimen: Cervix; Genital  Result Value Ref Range   Yeast Wet Prep HPF POC NONE SEEN NONE  SEEN   Trich, Wet Prep NONE SEEN NONE SEEN   Clue Cells Wet Prep HPF POC NONE SEEN NONE SEEN   WBC, Wet Prep HPF POC MODERATE (A) NONE SEEN   Sperm NONE SEEN   Fern Test     Status: Normal   Collection Time: 01/11/20  6:10 PM  Result Value Ref Range   POCT Fern Test Negative = intact amniotic membranes    O/Positive/-- (02/24 1537)  Imaging:  No results found.  MAU Course/MDM: Orders Placed This Encounter  Procedures  . Wet prep, genital  . Fern Test  . Discharge patient    No orders of the defined types were placed in this encounter.    NST reviewed and reactive Bleeding scant, c/w cervical exam today  Unable to send amnisure with light red bleeding on swab  No evidence of ROM with negative ferning and negative pooling in office and again in MAU tonight Bleeding and labor precautions reviewed D/C  home Keep scheduled appt 01/23/20 at [facility] discharge with strict return precautions.    Assessment: 1. Encounter for suspected premature rupture of amniotic membranes, with rupture of membranes not found   2. Fetal cardiac echogenic focus, single or unspecified fetus   3. Encounter for supervision of low-risk pregnancy in third trimester   4. Vaginal discharge during pregnancy in third trimester     Plan: Discharge home Labor precautions and fetal kick counts  Follow-up [place] [facility] [place] [facility] Follow up.   Specialty: Obstetrics and Gynecology Why: As scheduled, return to MAU as needed for signs of labor or emergencies. Contact information: [place] [address] [phone]             Allergies as of 01/11/2020      Reactions   Penicillins Anaphylaxis      Medication List    TAKE these medications   Blood Pressure Kit Devi 1 Device by Does not apply route daily. ICD 10: Z34.00   doxylamine (Sleep) 25 MG tablet Commonly known as: UNISOM Take 1 tablet (25 mg total) by mouth at bedtime as needed.   hydrOXYzine 25 MG tablet Commonly known as: ATARAX/VISTARIL Take 1 tablet (25 mg total) by mouth every 6 (six) hours as needed for anxiety.   ondansetron 8 MG disintegrating tablet Commonly known as: Zofran ODT Take 1 tablet (8 mg total) by mouth every 8 (eight) hours as needed for nausea or vomiting.   PRENATAL VITAMINS PO Take 1 tablet by mouth daily.   promethazine 12.5 MG tablet Commonly known as: PHENERGAN Take 1 tablet (12.5 mg total) by mouth every 6 (six) hours as needed for nausea or vomiting.   sertraline 25 MG tablet Commonly known as: Zoloft Take 2 tablets (50 mg total) by mouth daily.       Fatima Blank Certified Nurse-Midwife 01/11/2020 8:24 PM

## 2020-01-11 NOTE — Discharge Instructions (Signed)
Reasons to return to MAU at [facility]:  1.  Contractions are  5 minutes apart or less, each last 1 minute, these have been going on for 1-2 hours, and you cannot walk or talk during them 2.  You have a large gush of fluid, or a trickle of fluid that will not stop and you have to wear a pad 3.  You have bleeding that is bright red, heavier than spotting--like menstrual bleeding (spotting can be normal in early labor or after a check of your cervix) 4.  You do not feel the baby moving like he/she normally does  

## 2020-01-12 ENCOUNTER — Other Ambulatory Visit: Payer: Self-pay | Admitting: Women's Health

## 2020-01-12 DIAGNOSIS — B9689 Other specified bacterial agents as the cause of diseases classified elsewhere: Secondary | ICD-10-CM

## 2020-01-12 LAB — CERVICOVAGINAL ANCILLARY ONLY
Bacterial Vaginitis (gardnerella): POSITIVE — AB
Candida Glabrata: NEGATIVE
Candida Vaginitis: NEGATIVE
Chlamydia: NEGATIVE
Comment: NEGATIVE
Comment: NEGATIVE
Comment: NEGATIVE
Comment: NEGATIVE
Comment: NEGATIVE
Comment: NORMAL
Neisseria Gonorrhea: NEGATIVE
Trichomonas: NEGATIVE

## 2020-01-12 LAB — CBC
Hematocrit: 32.8 % — ABNORMAL LOW (ref 34.0–46.6)
Hemoglobin: 11.2 g/dL (ref 11.1–15.9)
MCH: 30.4 pg (ref 26.6–33.0)
MCHC: 34.1 g/dL (ref 31.5–35.7)
MCV: 89 fL (ref 79–97)
Platelets: 132 10*3/uL — ABNORMAL LOW (ref 150–450)
RBC: 3.68 x10E6/uL — ABNORMAL LOW (ref 3.77–5.28)
RDW: 11.8 % (ref 11.7–15.4)
WBC: 8.1 10*3/uL (ref 3.4–10.8)

## 2020-01-12 MED ORDER — METRONIDAZOLE 500 MG PO TABS: 500.0000 mg | ORAL_TABLET | Freq: Two times a day (BID) | ORAL | 0 refills | Status: AC

## 2020-01-12 NOTE — Progress Notes (Signed)
RX metronidazole for BV.  Marylen Ponto, NP  12:22 PM 01/12/2020

## 2020-01-14 LAB — STREP GP B CULTURE+RFLX: Strep Gp B Culture+Rflx: NEGATIVE

## 2020-01-16 ENCOUNTER — Ambulatory Visit (INDEPENDENT_AMBULATORY_CARE_PROVIDER_SITE_OTHER): Payer: Medicaid Other | Admitting: Clinical

## 2020-01-16 DIAGNOSIS — F39 Unspecified mood [affective] disorder: Secondary | ICD-10-CM

## 2020-01-23 ENCOUNTER — Other Ambulatory Visit: Payer: Self-pay

## 2020-01-23 ENCOUNTER — Encounter: Payer: Self-pay | Admitting: Medical

## 2020-01-23 ENCOUNTER — Ambulatory Visit (INDEPENDENT_AMBULATORY_CARE_PROVIDER_SITE_OTHER): Payer: Medicaid Other | Admitting: Medical

## 2020-01-23 VITALS — BP 92/63 | HR 82 | Wt 181.9 lb

## 2020-01-23 DIAGNOSIS — D696 Thrombocytopenia, unspecified: Secondary | ICD-10-CM

## 2020-01-23 DIAGNOSIS — O99113 Other diseases of the blood and blood-forming organs and certain disorders involving the immune mechanism complicating pregnancy, third trimester: Secondary | ICD-10-CM

## 2020-01-23 DIAGNOSIS — R87810 Cervical high risk human papillomavirus (HPV) DNA test positive: Secondary | ICD-10-CM

## 2020-01-23 DIAGNOSIS — IMO0001 Reserved for inherently not codable concepts without codable children: Secondary | ICD-10-CM

## 2020-01-23 DIAGNOSIS — R87610 Atypical squamous cells of undetermined significance on cytologic smear of cervix (ASC-US): Secondary | ICD-10-CM

## 2020-01-23 DIAGNOSIS — Z3A37 37 weeks gestation of pregnancy: Secondary | ICD-10-CM

## 2020-01-23 DIAGNOSIS — O358XX0 Maternal care for other (suspected) fetal abnormality and damage, not applicable or unspecified: Secondary | ICD-10-CM

## 2020-01-23 DIAGNOSIS — Z3493 Encounter for supervision of normal pregnancy, unspecified, third trimester: Secondary | ICD-10-CM

## 2020-01-23 NOTE — Progress Notes (Signed)
   PRENATAL VISIT NOTE  Subjective:  Megan Rocha is a 29 y.o. G2P1001 at [redacted]w[redacted]d being seen today for ongoing prenatal care.  She is currently monitored for the following issues for this high-risk pregnancy and has Supervision of low-risk pregnancy; Fetal cardiac echogenic focus; ASCUS with positive high risk HPV cervical; Gestational thrombocytopenia (HCC); and Depression affecting pregnancy in third trimester, antepartum on their problem list.  Patient reports occasional contractions and increased pelvic pressure, LE edema.  Contractions: Irritability. Vag. Bleeding: None.  Movement: Present. Denies leaking of fluid.   The following portions of the patient's history were reviewed and updated as appropriate: allergies, current medications, past family history, past medical history, past social history, past surgical history and problem list.   Objective:   Vitals:   01/23/20 1528  BP: 92/63  Pulse: 82  Weight: 181 lb 14.4 oz (82.5 kg)    Fetal Status: Fetal Heart Rate (bpm): 120 Fundal Height: 37 cm Movement: Present  Presentation: Vertex  General:  Alert, oriented and cooperative. Patient is in no acute distress.  Skin: Skin is warm and dry. No rash noted.   Cardiovascular: Normal heart rate noted  Respiratory: Normal respiratory effort, no problems with respiration noted  Abdomen: Soft, gravid, appropriate for gestational age.  Pain/Pressure: Present     Pelvic: Cervical exam performed in the presence of a chaperone Dilation: 1.5 Effacement (%): 80 Station: Ballotable  Extremities: Normal range of motion.  Edema: Deep pitting, indentation remains for a short time  Mental Status: Normal mood and affect. Normal behavior. Normal judgment and thought content.   Assessment and Plan:  Pregnancy: G2P1001 at [redacted]w[redacted]d 1. Encounter for supervision of low-risk pregnancy in third trimester - Peds list given - Planning to bottlefeed  - GBS negative   2. Benign gestational thrombocytopenia in  third trimester (HCC) - Plt on 7/21 was 132  3. [redacted] weeks gestation of pregnancy  4. ASCUS with positive high risk HPV cervical - Had colpo, next pap smear due 10/2020  5. Fetal cardiac echogenic focus, single or unspecified fetus  Term labor symptoms and general obstetric precautions including but not limited to vaginal bleeding, contractions, leaking of fluid and fetal movement were reviewed in detail with the patient. Please refer to After Visit Summary for other counseling recommendations.   Return in about 1 week (around 01/30/2020) for LOB, In-Person.  Future Appointments  Date Time Provider Department Center  02/02/2020 10:55 AM Gerrit Heck, [facility]  02/09/2020 10:35 AM Marny Lowenstein, PA-C [facility]    Vonzella Nipple, PA-C

## 2020-01-23 NOTE — Patient Instructions (Addendum)
Fetal Movement Counts °Patient Name: ________________________________________________ Patient Due Date: ____________________ °What is a fetal movement count? ° °A fetal movement count is the number of times that you feel your baby move during a certain amount of time. This may also be called a fetal kick count. A fetal movement count is recommended for every pregnant woman. You may be asked to start counting fetal movements as early as week 28 of your pregnancy. °Pay attention to when your baby is most active. You may notice your baby's sleep and wake cycles. You may also notice things that make your baby move more. You should do a fetal movement count: °· When your baby is normally most active. °· At the same time each day. °A good time to count movements is while you are resting, after having something to eat and drink. °How do I count fetal movements? °1. Find a quiet, comfortable area. Sit, or lie down on your side. °2. Write down the date, the start time and stop time, and the number of movements that you felt between those two times. Take this information with you to your health care visits. °3. Write down your start time when you feel the first movement. °4. Count kicks, flutters, swishes, rolls, and jabs. You should feel at least 10 movements. °5. You may stop counting after you have felt 10 movements, or if you have been counting for 2 hours. Write down the stop time. °6. If you do not feel 10 movements in 2 hours, contact your health care provider for further instructions. Your health care provider may want to do additional tests to assess your baby's well-being. °Contact a health care provider if: °· You feel fewer than 10 movements in 2 hours. °· Your baby is not moving like he or she usually does. °Date: ____________ Start time: ____________ Stop time: ____________ Movements: ____________ °Date: ____________ Start time: ____________ Stop time: ____________ Movements: ____________ °Date: ____________  Start time: ____________ Stop time: ____________ Movements: ____________ °Date: ____________ Start time: ____________ Stop time: ____________ Movements: ____________ °Date: ____________ Start time: ____________ Stop time: ____________ Movements: ____________ °Date: ____________ Start time: ____________ Stop time: ____________ Movements: ____________ °Date: ____________ Start time: ____________ Stop time: ____________ Movements: ____________ °Date: ____________ Start time: ____________ Stop time: ____________ Movements: ____________ °Date: ____________ Start time: ____________ Stop time: ____________ Movements: ____________ °This information is not intended to replace advice given to you by your health care provider. Make sure you discuss any questions you have with your health care provider. °Document Revised: 01/27/2019 Document Reviewed: 01/27/2019 °Elsevier Patient Education © 2020 Elsevier Inc. °Braxton Hicks Contractions °Contractions of the uterus can occur throughout pregnancy, but they are not always a sign that you are in labor. You may have practice contractions called Braxton Hicks contractions. These false labor contractions are sometimes confused with true labor. °What are Braxton Hicks contractions? °Braxton Hicks contractions are tightening movements that occur in the muscles of the uterus before labor. Unlike true labor contractions, these contractions do not result in opening (dilation) and thinning of the cervix. Toward the end of pregnancy (32-34 weeks), Braxton Hicks contractions can happen more often and may become stronger. These contractions are sometimes difficult to tell apart from true labor because they can be very uncomfortable. You should not feel embarrassed if you go to the hospital with false labor. °Sometimes, the only way to tell if you are in true labor is for your health care provider to look for changes in the cervix. The health care provider   will do a physical exam and may  monitor your contractions. If you are not in true labor, the exam should show that your cervix is not dilating and your water has not broken. °If there are no other health problems associated with your pregnancy, it is completely safe for you to be sent home with false labor. You may continue to have Braxton Hicks contractions until you go into true labor. °How to tell the difference between true labor and false labor °True labor °· Contractions last 30-70 seconds. °· Contractions become very regular. °· Discomfort is usually felt in the top of the uterus, and it spreads to the lower abdomen and low back. °· Contractions do not go away with walking. °· Contractions usually become more intense and increase in frequency. °· The cervix dilates and gets thinner. °False labor °· Contractions are usually shorter and not as strong as true labor contractions. °· Contractions are usually irregular. °· Contractions are often felt in the front of the lower abdomen and in the groin. °· Contractions may go away when you walk around or change positions while lying down. °· Contractions get weaker and are shorter-lasting as time goes on. °· The cervix usually does not dilate or become thin. °Follow these instructions at home: ° °· Take over-the-counter and prescription medicines only as told by your health care provider. °· Keep up with your usual exercises and follow other instructions from your health care provider. °· Eat and drink lightly if you think you are going into labor. °· If Braxton Hicks contractions are making you uncomfortable: °? Change your position from lying down or resting to walking, or change from walking to resting. °? Sit and rest in a tub of warm water. °? Drink enough fluid to keep your urine pale yellow. Dehydration may cause these contractions. °? Do slow and deep breathing several times an hour. °· Keep all follow-up prenatal visits as told by your health care provider. This is important. °Contact a  health care provider if: °· You have a fever. °· You have continuous pain in your abdomen. °Get help right away if: °· Your contractions become stronger, more regular, and closer together. °· You have fluid leaking or gushing from your vagina. °· You pass blood-tinged mucus (bloody show). °· You have bleeding from your vagina. °· You have low back pain that you never had before. °· You feel your baby’s head pushing down and causing pelvic pressure. °· Your baby is not moving inside you as much as it used to. °Summary °· Contractions that occur before labor are called Braxton Hicks contractions, false labor, or practice contractions. °· Braxton Hicks contractions are usually shorter, weaker, farther apart, and less regular than true labor contractions. True labor contractions usually become progressively stronger and regular, and they become more frequent. °· Manage discomfort from Braxton Hicks contractions by changing position, resting in a warm bath, drinking plenty of water, or practicing deep breathing. °This information is not intended to replace advice given to you by your health care provider. Make sure you discuss any questions you have with your health care provider. °Document Revised: 05/22/2017 Document Reviewed: 10/23/2016 °Elsevier Patient Education © 2020 Elsevier Inc. ° °[facility]ILY PRACTICE PHYSICI[facility] ° °[place] ([address]) °•  [facility] °o Chambliss, MD; Eniola, MD; Hale, MD; Hensel, MD; McDiarmid, MD; McIntyer, MD; Neal, MD; Walden, MD °o [address] [place], [place] [address] °o [phone] °o Mon-Fri 8:30-12:30, 1:30-5:00 °o Providers come to see babies   at [facility] °o Accepting Medicaid °• [facility] °o Limited providers who accept newborns: Koirala, MD; Morrow, MD; Wolters, MD °o [address], [place], [place] [address] °o [phone] °o Mon-Fri 8:00-5:30 °o Babies seen by providers at  [facility] °o Does NOT accept Medicaid °o Please call early in hospitalization for appointment (limited availability)  °• [facility] °o Mulberry, MD °o [address] [place], [place] [address] °o [phone] °o Mon, Tue, Thur, Fri 8:30-5:00, Wed 10:00-7:00 (closed 1-2pm) °o Babies seen by [facility] providers °o Accepting Medicaid °• [facility] °o Rubin, MD °o [address], [place], [place] [address] °o [phone] °o Mon-Fri 8:30-5:00, Sat 8:30-12:00 °o Provider comes to see babies at [facility] °o Accepting Medicaid °o Must have been referred from current patients or contacted office prior to delivery °• [facility] ([facility]) °o Brown, MD; Chandler, MD; Ettefagh, MD; Grant, MD; Lester, MD; McCormick, MD; McQueen, MD; Prose, MD; Simha, MD; Stanley, MD; Stryffeler, NP; Tebben, NP °o [address], [place], [place] [address] °o [phone] °o Mon, Tue, Thur, Fri 8:30-5:30, Wed 9:30-5:30, Sat 8:30-12:30 °o Babies seen by [facility] providers °o Accepting Medicaid °o Only accepting infants of first-time parents or siblings of current patients °o Hospital discharge coordinator will make follow-up appointment °• Jack Amos °o [address], [place], [place]  [address] °o [phone]   Fax - [phone] °• [facility] °o [address], [place], [place]  [address] °o Phone - [phone]   Fax - [phone] °• Shilpa Gosrani °o [address], [place], [place]  [address] °o [phone] ° °[place] ([address]) °•  [facility] °o Bates, MD; Brassfield, MD; Cooper, Cox, MD; MD; Davis, MD; Dovico, MD; Ettefaugh, MD; Little, MD; Lowe, MD; Keiffer, MD; Melvin, MD; Sumner, MD; Williams, MD °o [address], [place], [place] [address] °o [phone] °o Mon-Fri 8:30-5:00 (extended evenings Mon-Thur as needed), Sat-Sun  10:00-1:00 °o Providers come to see babies at [facility] °o Accepting Medicaid for families of first-time babies and families with all children in the household age 3 and under. Must register with office prior to making appointment (M-F only). °• [facility] °o Henson, NP; Knapp, MD; Lalonde, MD; Tysinger, PA °o [address] [place], [place] [address] °o [phone] °o Mon-Fri 8:00-5:00 °o Babies seen by providers at [facility] °o Does NOT accept Medicaid/Commercial Insurance Only °• [facility] ([facility])  °o Artis, MD; Barnes, MD; Bratton, MD; Coccaro, MD; Lockett Gardner, MD; Kramer, MD; Marshall, MD; Netherton, MD; Poleto, MD; Skinner, MD °o [address] [place], [place] [address] °o [phone] °o Mon-Fri 8:30-5:30, Sat (Oct.-Mar.) 9:00-1:00 °o Babies seen by providers at [facility] °o Accepting Medicaid ° °[place] ([address]) °• [facility] [facility] °o Reid, MD; Warner, MD °o [address], [place], [place] [address] °o [phone] °o Mon-Fri 8:30-5:00, Sat 8:30-12:00 °o Providers come to see babies at [facility] °o Does NOT accept Medicaid °• [facility] °o Becker, PA; Hagler, MD; Scifres, PA; Sun, MD; Swayne, MD °o [address], , [place] [address] °o [phone] °o Mon-Fri 8:00-5:00 °o Babies seen by providers at [facility] °o Does NOT accept Medicaid °o Only accepting babies of parents who are patients °o Please call   early in hospitalization for appointment (limited availability) °• [facility] °o Clark, MD; Frye, MD; Kelleher, MD; Mack, NP; Miller, MD; O'Keller, MD; Patterson, NP; Pudlo, MD; Puzio, MD; Thomas, MD; Tucker, MD; Twiselton, MD °o [address], [place], [place] [address] °o [phone] °o Mon-Fri 8:00-5:00, Sat 9:00-12:00 °o Providers come to see babies at [facility] °o Does NOT accept  Medicaid ° °[place] ([address]) °• [facility] °o Limited providers accepting new patients: Brake, NP; Wharton, PA °o [address], [place], [place] [address] °o [phone] °o Mon-Fri 8:00-5:00 °o Babies seen by providers at [facility] °o Does NOT accept Medicaid °o Only accepting babies of parents who are patients °o Please call early in hospitalization for appointment (limited availability) °• [facility] °o Gay, MD; Quinlan, MD °o [address] [place], [place] [address] °o [phone] (press 1 to schedule appointment) °o Mon-Fri 8:00-5:00 °o Providers come to see babies at [facility] °o Does NOT accept Medicaid °• [facility] °o Mazer, MD °o [address] [place], [place] [address] °o [phone] °o Mon-Fri 8:30-5:00 (lunch 12:30-1:00), extended hours by appointment only Wed 5:00-6:30 °o Babies seen by [facility] providers °o Accepting Medicaid °• [facility] °o Banks, MD; Jordan, MD; Koberlein, MD °o [address], [place], [place] [address] °o [phone] °o Mon-Fri 8:00-5:00 °o Babies seen by [facility] providers °o Does NOT accept Medicaid °• [facility] °o Parker, MD; Hunter, MD; Wallace, DO °o [address] [place], [place] [address] °o [phone] °o Mon-Fri 8:00-5:00 °o Babies seen by [facility] providers °o Does NOT accept Medicaid °• [facility] °o Brandon, PA; Brecken, PA; Christy, NP; Dees, MD; DeClaire, MD; DeWeese, MD; Hansen, NP; Mills, NP; Parrish, NP; Smoot, NP; Summer, MD; Vapne, MD °o [address] [place], [place] [address] °o [phone] °o Mon-Fri 8:30-5:00, Sat 10:00-1:00 °o Providers come to see babies at [facility] °o Does NOT accept Medicaid °o Free prenatal information session Tuesdays at 4:45pm °• [facility] °o Bouska, MD; Gordon, PA; Jeffery, PA; Weber, PA °o [address] [place] [address] °o [phone] °o Mon-Fri 7:30-5:30 °o Babies seen by [facility] providers °• [facility] °o [address], [place], [place]  [address] °o [phone]   Fax - [phone] ° °[place] ([address]) °• [facility] °o Reese, MD °o [address] [place], [place] [address] °o [phone] °o Mon-Thur 8:00-6:00 °o Providers come to see babies at [facility] °o Accepting Medicaid °• [facility] °o Anderson, NP; Badger, MD; Beal, PA; Spencer, PA °o [address] [place], [place] [address] °o [phone] °o Mon-Thur 7:30-7:30, Fri 7:30-4:30 °o Babies seen by [facility] providers °o Accepting Medicaid °• [facility] °o Agbuya, MD; Klett, NP; Romgoolam, MD °o [address], [place],  [address] °o [phone] °o Mon-Fri 8:30-5:00, Sat 8:30-12:00 °o Providers come to see babies at [facility] °o Accepting Medicaid °o Must have “Meet & Greet” appointment at office prior to delivery °• [facility] ([facility]) °o McCord, MD; Wallace, MD; Wood, MD °o [address], [place],  [address] °o [phone] °o Mon-Wed 8:00-6:00, Thur-Fri 8:00-5:00, Sat 9:00-12:00 °o Providers come to see babies at [facility] °o Does NOT accept Medicaid °o Only accepting siblings of current patients °• [facility]   °  o [address], [place], [place]  [address] °o [phone]   Fax - [phone] °• [facility] °o [address], [place], [place]  [address] °o [phone]   Fax - [phone] ° °[place] ([address]) °• [facility] °o Cirigliano, DO; Matthews, DO °o [address] [place], [place] [address] °o [phone] °o Mon-Fri 7:00-5:00 °o Babies seen by [facility] providers °o Does NOT  accept Medicaid °• [facility] °o Briscoe, MD; Howley, PA; Moreira, PA °o [address], [place], [place] [address] °o [phone] °o Mon-Fri 8:00-5:00 °o Babies seen by [facility] providers °o Accepting Medicaid °• [facility] °o Boyd, MD; Church, PA; Jones, NP; Osborn, PA °o [address], [place], [place] [address] °o [phone] °o Mon-Fri 8:00-5:00 °o Babies seen by providers at [facility] °o Accepting Medicaid ° °[place] ([address]) °• [facility] [place] °o Wendling, DO °o [address] [place], [place] [address] °o [phone] °o Mon-Fri 8:00-5:00 °o Babies seen by [facility] providers °o Does NOT accept Medicaid °o Limited availability, please call early in hospitalization to schedule follow-up °• [facility] °o Calderon, PA; Cummings, MD; Dillard, MD; Martin, PA; Olson, MD; VanDeven, PA °o [address], [place], [place] [address] °o [phone] °o Mon-Fri 8:30-5:00, Sat 9:00-12:00 °o Babies seen by providers at [facility] °o Accepting Medicaid °o Please register online then schedule online or call office °o [email or web address] °• [facility] ([facility] °o Hunter, NP; Kumar, MD; Martin Rogers, PA °o [address], [place], [place] [address] °o [phone] °o Mon-Fri 8:00-5:00 °o Babies seen by providers at [facility] °o Accepting Medicaid °• [facility] ([facility] °o Henry, MD; Kristi Fleenor, NP; West, MD °o [address], [place], [place] [address] °o [phone] °o Mon-Fri 8:00-5:30, Sat&Sun by appointment (phones open at 8:30) °o Babies seen by [facility] providers °o Accepting Medicaid °o Must be a first-time baby or sibling of current patient °• [facility] [place]  °o [address], [place], [place]  [address] °o [phone]   Fax - [phone] ° °[place] ([address]) °• [facility] [place] [facility] °o Brown, PA; Cowen, PA; Rice, MD; Helton, PA; Spry, MD °o [address] [place], [place] [address] °o [phone] °o Mon-Thur 8:00-7:00, Fri 8:00-5:00, Sat 8:00-12:00, Sun 9:00-12:00 °o Babies seen by [facility] providers °o Accepting Medicaid °• [facility] [place] °o Coe-Goins, MD; Marshall, MD; Pierre-Louis, MD °o [address] [address], [place], [place] [address] °o [phone] °o Mon-Thur 8:00-5:00 °o Babies seen by providers at [facility] °o Accepting Medicaid °• [facility] °o Bratton, MD; Coe-Goins, MD; Hayes, MD; Lewis, MD; List, MD; Lott, MD; Marshall, MD; Moran, MD; O'Neal, MD; Pierre-Louis, MD; Pitonzo, MD; Scholer, MD; Spangle, MD °o [address] [place], [place] [address] °o [phone] °o Mon-Fri 8:00-5:30, Sat (Oct.-Mar.) 9:00-1:00 °o Babies seen by providers at [facility] °o Accepting Medicaid °o Must fill out new patient packet, available online at [email or web address] °• [facility] ([facility]) °o Friddle, NP; Harris, NP; Kelly, NP; Logan,   MD; Melvin, PA; Poth, MD; Ramadoss, MD; Stanton, NP °o [address], [place], [place] [address] °o [phone] °o Mon-Thur 8:00-5:30, Fri 8:00-5:00 °o Babies seen by providers at [facility] °o Accepting Medicaid ° °[place] ([address]) °• [facility] °o Dixon, PA; Cumberland, MD; Pickard, MD; Tapia, PA °o [address], [place], [place] [address] °o [phone] °o Mon-Fri 8:00-5:00 °o Babies seen by providers at [facility] °o Accepting Medicaid  ° °[place] ([address]) °• [facility] [place] °o Masneri, DO; Meyers, MD; Nelson, PA °o [address], [place], [place]  [address] °o [phone] °o Mon-Fri 8:00-5:00 °o Babies seen by providers at [facility] °o Does NOT accept Medicaid °o Limited appointment availability, please call early in hospitalization ° °• [facility] [place] °o Kunedd, DO; McGowen, MD °o [address], [place], [place] [address] °o [phone] °o Mon-Fri 8:00-5:00 °o Babies seen by [facility] providers °o Does NOT accept Medicaid °• [facility] [place] °o Cameron, MD; MacDonald, MD; Michaels, PA; Nayak, MD °o [address] [place] [address], [place], [place] [address] °o [phone] °o Mon-Fri 8:00-5:00 °o After hours clinic ([address] [place], [place] [address]) [phone] Mon-Fri 5:00-8:00, Sat 12:00-6:00, Sun 10:00-4:00 °o Babies seen by [facility] providers °o Accepting Medicaid °• [facility] [place] °o [address], [place], [place]  [address] °o [phone]   Fax - [phone] ° °[place] ([address]) °• [facility] [place] [facility] °o Andy, MD °o [address], [place], [place] [address] °o [phone] °o Mon-Fri 8:00-5:00 °o Babies seen by [facility] providers °o Does NOT accept Medicaid °• [facility] [place] ([facility] [place]) °o Eksir, MD °o [address], [place], [place] [address] °o [phone] °o Mon-Thur 8:00-7:00, Fri 8:00-5:00, Sat 8:00-12:00 °o Babies seen by providers at [facility] °o Accepting Medicaid - but does not have vaccinations in office (must be received elsewhere) °o Limited availability, please call early in hospitalization ° °[place] ([address]) °• [facility]  °o Charlene Flemming, MD °o [address], [place] [address] °o [phone]  Fax [phone] ° ° °

## 2020-02-01 ENCOUNTER — Telehealth (INDEPENDENT_AMBULATORY_CARE_PROVIDER_SITE_OTHER): Payer: Medicaid Other | Admitting: Family Medicine

## 2020-02-01 DIAGNOSIS — Z3493 Encounter for supervision of normal pregnancy, unspecified, third trimester: Secondary | ICD-10-CM

## 2020-02-01 NOTE — Telephone Encounter (Signed)
Patient called into the office wanting to speak to a nurse because she has been experience some on and off pain since last night and does not know if they are contractions or now. Patient stated that she also feels pressure down there. Patient instructed that a message will be sent to the nurses and they will contact her as soon as they can. Patient verbalized understanding and message sent to clinical pool

## 2020-02-02 ENCOUNTER — Ambulatory Visit (INDEPENDENT_AMBULATORY_CARE_PROVIDER_SITE_OTHER): Payer: Medicaid Other

## 2020-02-02 ENCOUNTER — Telehealth: Payer: Self-pay

## 2020-02-02 ENCOUNTER — Other Ambulatory Visit: Payer: Self-pay

## 2020-02-02 VITALS — BP 105/67 | HR 89 | Wt 183.2 lb

## 2020-02-02 DIAGNOSIS — O99113 Other diseases of the blood and blood-forming organs and certain disorders involving the immune mechanism complicating pregnancy, third trimester: Secondary | ICD-10-CM

## 2020-02-02 DIAGNOSIS — D696 Thrombocytopenia, unspecified: Secondary | ICD-10-CM

## 2020-02-02 DIAGNOSIS — Z3A39 39 weeks gestation of pregnancy: Secondary | ICD-10-CM

## 2020-02-02 DIAGNOSIS — Z3493 Encounter for supervision of normal pregnancy, unspecified, third trimester: Secondary | ICD-10-CM

## 2020-02-02 NOTE — Progress Notes (Addendum)
   LOW-RISK PREGNANCY OFFICE VISIT  Patient name: Megan Rocha MRN [id number]  Date of birth: 31-Mar-1991 Chief Complaint:   Routine Prenatal Visit  Subjective:   Empress Newmann is a 29 y.o. 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  Patient presents today with complaint of pelvic pain and soreness. She endorses fetal movement and reports contractions, but "not enough to go to the hospital." Patient tearful after examine.  She expresses disappointment with lack of dilation and states that she is tired and sore.  Patient denies vaginal concerns including abnormal discharge, leaking of fluid, and bleeding.  Contractions: Irritability. Vag. Bleeding: None.  Movement: Present.  Reviewed past medical,surgical, social, obstetrical and family history as well as problem list, medications and allergies.  Objective   Vitals:   02/02/20 1122  BP: 105/67  Pulse: 89  Weight: 183 lb 3.2 oz (83.1 kg)  Body mass index is 27.05 kg/m.  Total Weight Gain:23 lb 3.2 oz (10.5 kg)         Physical Examination:   General appearance: Well appearing, and in no distress  Mental status: Alert, oriented to person, place, and time  Skin: Warm & dry  Cardiovascular: Normal heart rate noted  Respiratory: Normal respiratory effort, no distress  Abdomen: Soft, gravid, nontender, AGA with Fundal height of Fundal Height: 37 cm  Pelvic: Cervical exam performed  Dilation: 1.5 Effacement (%): 50 Station: Ballotable Presentation: Vertex  Extremities: Edema: Mild pitting, slight indentation  Fetal Status: Fetal Heart Rate (bpm): 128  Movement: Present   No results found for this or any  previous visit (from the past 24 hour(s)).  Assessment & Plan:  Low-risk pregnancy of a 29 y.o., G2P1001 at [redacted]w[redacted]d with an Estimated Date of Delivery: 02/07/20   1. Encounter for supervision of low-risk pregnancy in third trimester -Discussed IOL after next appt. -Reviewed outpatient foley bulb placement and patient declines stating she is not comfortable with that. -Reassured that if she is not comfortable with outpatient placement, she did not have to receive it.  -Informed that will plan for induction on 8/19 after MN or next available after that date for MN induction. -Comfort given regarding cervical exam today.  Informed that it is not abnormal to not have cervical change without regular contractions. -Encouraged to rest and relax prior to infant arrival.  2. Benign gestational thrombocytopenia in third trimester (HCC) Plts 7/21: 132    Meds: No orders of the defined types were placed in this encounter.  Labs/procedures today:  Lab Orders  No laboratory test(s) ordered today     Reviewed: Term labor symptoms and general obstetric precautions including but not limited to vaginal bleeding, contractions, leaking of fluid and fetal movement were reviewed in detail with the patient.  All questions were answered.  Follow-up: Return in about 1 week (around 02/09/2020) for LROB.  No orders of the defined types were placed in this encounter.  Cherre Robins MSN, CNM 02/02/2020

## 2020-02-02 NOTE — Telephone Encounter (Signed)
Called Pt back to explain why the original Induction date was showing for 02/14/20. I did advise that it has been changed to 02/09/2020 and that the hospital will call her with the time of day & not to go by the time in My Chart. Pt verbalized understanding.

## 2020-02-02 NOTE — Patient Instructions (Signed)

## 2020-02-02 NOTE — Telephone Encounter (Signed)
Patient called in stating that after her visit today she was suppose to have an induction scheduled for the 19th or 20th. Patient stated that she received a message in her mychart that her induction was scheduled for the 24th and she was told that she had to reach out to the office to have it pushed up. Patient instructed that a message will be sent to the nurses and they will contact her. Patient verbalized understanding and message sent to clinical pool.

## 2020-02-02 NOTE — Addendum Note (Signed)
Addended by: Gerrit Heck L on: 02/02/2020 12:06 PM   Modules accepted: Orders, SmartSet

## 2020-02-02 NOTE — Telephone Encounter (Signed)
Returned patients call. Patient reports that was an emergency call yesterday and all is ok now, she reports she is on the way to the office now for her appt.

## 2020-02-09 ENCOUNTER — Telehealth: Payer: Medicaid Other | Admitting: Medical

## 2020-02-09 ENCOUNTER — Other Ambulatory Visit: Payer: Self-pay | Admitting: Advanced Practice Midwife

## 2020-02-09 ENCOUNTER — Inpatient Hospital Stay (HOSPITAL_COMMUNITY)
Admission: AD | Admit: 2020-02-09 | Discharge: 2020-02-11 | DRG: 806 | Disposition: A | Discharge status: Home / Self Care | Payer: Medicaid Other | Attending: Obstetrics and Gynecology | Admitting: Obstetrics and Gynecology

## 2020-02-09 ENCOUNTER — Encounter (HOSPITAL_COMMUNITY): Payer: Self-pay | Admitting: Family Medicine

## 2020-02-09 ENCOUNTER — Inpatient Hospital Stay (HOSPITAL_COMMUNITY): Payer: Medicaid Other

## 2020-02-09 ENCOUNTER — Other Ambulatory Visit: Payer: Self-pay

## 2020-02-09 DIAGNOSIS — Z880 Allergy status to penicillin: Secondary | ICD-10-CM | POA: Diagnosis not present

## 2020-02-09 DIAGNOSIS — R87810 Cervical high risk human papillomavirus (HPV) DNA test positive: Secondary | ICD-10-CM | POA: Diagnosis not present

## 2020-02-09 DIAGNOSIS — IMO0001 Reserved for inherently not codable concepts without codable children: Secondary | ICD-10-CM

## 2020-02-09 DIAGNOSIS — D696 Thrombocytopenia, unspecified: Secondary | ICD-10-CM | POA: Diagnosis present

## 2020-02-09 DIAGNOSIS — O9912 Other diseases of the blood and blood-forming organs and certain disorders involving the immune mechanism complicating childbirth: Secondary | ICD-10-CM | POA: Diagnosis present

## 2020-02-09 DIAGNOSIS — O358XX0 Maternal care for other (suspected) fetal abnormality and damage, not applicable or unspecified: Secondary | ICD-10-CM | POA: Diagnosis present

## 2020-02-09 DIAGNOSIS — IMO0002 Reserved for concepts with insufficient information to code with codable children: Secondary | ICD-10-CM | POA: Diagnosis present

## 2020-02-09 DIAGNOSIS — O9852 Other viral diseases complicating childbirth: Secondary | ICD-10-CM | POA: Diagnosis not present

## 2020-02-09 DIAGNOSIS — F329 Major depressive disorder, single episode, unspecified: Secondary | ICD-10-CM | POA: Diagnosis present

## 2020-02-09 DIAGNOSIS — O480 Post-term pregnancy: Principal | ICD-10-CM | POA: Diagnosis present

## 2020-02-09 DIAGNOSIS — Z3A40 40 weeks gestation of pregnancy: Secondary | ICD-10-CM

## 2020-02-09 DIAGNOSIS — D6959 Other secondary thrombocytopenia: Secondary | ICD-10-CM | POA: Diagnosis not present

## 2020-02-09 DIAGNOSIS — R87610 Atypical squamous cells of undetermined significance on cytologic smear of cervix (ASC-US): Secondary | ICD-10-CM | POA: Diagnosis present

## 2020-02-09 DIAGNOSIS — Z20822 Contact with and (suspected) exposure to covid-19: Secondary | ICD-10-CM | POA: Diagnosis not present

## 2020-02-09 DIAGNOSIS — O99343 Other mental disorders complicating pregnancy, third trimester: Secondary | ICD-10-CM | POA: Diagnosis present

## 2020-02-09 DIAGNOSIS — O99112 Other diseases of the blood and blood-forming organs and certain disorders involving the immune mechanism complicating pregnancy, second trimester: Secondary | ICD-10-CM | POA: Diagnosis present

## 2020-02-09 DIAGNOSIS — Z3493 Encounter for supervision of normal pregnancy, unspecified, third trimester: Secondary | ICD-10-CM

## 2020-02-09 LAB — CBC
HCT: 33.5 % — ABNORMAL LOW (ref 36.0–46.0)
Hemoglobin: 11.2 g/dL — ABNORMAL LOW (ref 12.0–15.0)
MCH: 30.7 pg (ref 26.0–34.0)
MCHC: 33.4 g/dL (ref 30.0–36.0)
MCV: 91.8 fL (ref 80.0–100.0)
Platelets: 167 10*3/uL (ref 150–400)
RBC: 3.65 MIL/uL — ABNORMAL LOW (ref 3.87–5.11)
RDW: 12.8 % (ref 11.5–15.5)
WBC: 8.7 10*3/uL (ref 4.0–10.5)
nRBC: 0 % (ref 0.0–0.2)

## 2020-02-09 LAB — TYPE AND SCREEN
ABO/RH(D): O POS
Antibody Screen: NEGATIVE

## 2020-02-09 LAB — SARS CORONAVIRUS 2 BY RT PCR (HOSPITAL ORDER, PERFORMED IN ~~LOC~~ HOSPITAL LAB): SARS Coronavirus 2: NEGATIVE

## 2020-02-09 MED ORDER — DIPHENHYDRAMINE HCL 50 MG/ML IJ SOLN: 12.5000 mg | INTRAMUSCULAR | Status: DC | PRN

## 2020-02-09 MED ORDER — IBUPROFEN 600 MG PO TABS
600.0000 mg | ORAL_TABLET | Freq: Four times a day (QID) | ORAL | Status: DC
  Administered 2020-02-09 – 2020-02-11 (×6): 600 mg via ORAL
  Filled 2020-02-09 (×7): qty 1

## 2020-02-09 MED ORDER — LACTATED RINGERS IV SOLN: INTRAVENOUS | Status: DC

## 2020-02-09 MED ORDER — MISOPROSTOL 25 MCG QUARTER TABLET: 25.0000 ug | ORAL_TABLET | ORAL | Status: DC | PRN

## 2020-02-09 MED ORDER — TERBUTALINE SULFATE 1 MG/ML IJ SOLN: 0.2500 mg | Freq: Once | INTRAMUSCULAR | Status: DC | PRN

## 2020-02-09 MED ORDER — OXYTOCIN-SODIUM CHLORIDE 30-0.9 UT/500ML-% IV SOLN
1.0000 m[IU]/min | INTRAVENOUS | Status: DC
  Administered 2020-02-09: 4 m[IU]/min via INTRAVENOUS
  Administered 2020-02-09: 2 m[IU]/min via INTRAVENOUS

## 2020-02-09 MED ORDER — EPHEDRINE 5 MG/ML INJ: 10.0000 mg | INTRAVENOUS | Status: DC | PRN

## 2020-02-09 MED ORDER — ACETAMINOPHEN 325 MG PO TABS: 650.0000 mg | ORAL_TABLET | ORAL | Status: DC | PRN

## 2020-02-09 MED ORDER — COCONUT OIL OIL: 1.0000 "application " | TOPICAL_OIL | Status: DC | PRN

## 2020-02-09 MED ORDER — OXYTOCIN-SODIUM CHLORIDE 30-0.9 UT/500ML-% IV SOLN
2.5000 [IU]/h | INTRAVENOUS | Status: DC
  Filled 2020-02-09: qty 500

## 2020-02-09 MED ORDER — WITCH HAZEL-GLYCERIN EX PADS: 1.0000 "application " | MEDICATED_PAD | CUTANEOUS | Status: DC | PRN

## 2020-02-09 MED ORDER — ZOLPIDEM TARTRATE 5 MG PO TABS: 5.0000 mg | ORAL_TABLET | Freq: Every evening | ORAL | Status: DC | PRN

## 2020-02-09 MED ORDER — PRENATAL MULTIVITAMIN CH
1.0000 | ORAL_TABLET | Freq: Every day | ORAL | Status: DC
  Administered 2020-02-10: 1 via ORAL
  Filled 2020-02-09 (×2): qty 1

## 2020-02-09 MED ORDER — SIMETHICONE 80 MG PO CHEW: 80.0000 mg | CHEWABLE_TABLET | ORAL | Status: DC | PRN

## 2020-02-09 MED ORDER — PHENYLEPHRINE 40 MCG/ML (10ML) SYRINGE FOR IV PUSH (FOR BLOOD PRESSURE SUPPORT): 80.0000 ug | PREFILLED_SYRINGE | INTRAVENOUS | Status: DC | PRN

## 2020-02-09 MED ORDER — PROMETHAZINE HCL 25 MG/ML IJ SOLN
25.0000 mg | Freq: Four times a day (QID) | INTRAMUSCULAR | Status: DC | PRN
  Administered 2020-02-09: 25 mg via INTRAVENOUS
  Filled 2020-02-09: qty 1

## 2020-02-09 MED ORDER — SOD CITRATE-CITRIC ACID 500-334 MG/5ML PO SOLN: 30.0000 mL | ORAL | Status: DC | PRN

## 2020-02-09 MED ORDER — DIBUCAINE (PERIANAL) 1 % EX OINT: 1.0000 "application " | TOPICAL_OINTMENT | CUTANEOUS | Status: DC | PRN

## 2020-02-09 MED ORDER — TETANUS-DIPHTH-ACELL PERTUSSIS 5-2.5-18.5 LF-MCG/0.5 IM SUSP: 0.5000 mL | Freq: Once | INTRAMUSCULAR | Status: DC

## 2020-02-09 MED ORDER — ONDANSETRON HCL 4 MG/2ML IJ SOLN: 4.0000 mg | INTRAMUSCULAR | Status: DC | PRN

## 2020-02-09 MED ORDER — SENNOSIDES-DOCUSATE SODIUM 8.6-50 MG PO TABS
2.0000 | ORAL_TABLET | ORAL | Status: DC
  Administered 2020-02-10: 2 via ORAL
  Filled 2020-02-09: qty 2

## 2020-02-09 MED ORDER — ONDANSETRON HCL 4 MG/2ML IJ SOLN: 4.0000 mg | Freq: Four times a day (QID) | INTRAMUSCULAR | Status: DC | PRN

## 2020-02-09 MED ORDER — SODIUM CHLORIDE 0.9 % IV SOLN
25.0000 mg | Freq: Once | INTRAVENOUS | Status: DC
  Filled 2020-02-09: qty 1

## 2020-02-09 MED ORDER — LIDOCAINE HCL (PF) 1 % IJ SOLN
30.0000 mL | INTRAMUSCULAR | Status: AC | PRN
  Administered 2020-02-09: 30 mL via SUBCUTANEOUS
  Filled 2020-02-09: qty 30

## 2020-02-09 MED ORDER — FENTANYL CITRATE (PF) 100 MCG/2ML IJ SOLN
50.0000 ug | INTRAMUSCULAR | Status: DC | PRN
  Administered 2020-02-09 (×2): 100 ug via INTRAVENOUS
  Filled 2020-02-09 (×2): qty 2

## 2020-02-09 MED ORDER — LACTATED RINGERS IV BOLUS: 250.0000 mL | Freq: Once | INTRAVENOUS | Status: DC

## 2020-02-09 MED ORDER — LACTATED RINGERS IV SOLN
500.0000 mL | INTRAVENOUS | Status: DC | PRN
  Administered 2020-02-09: 500 mL via INTRAVENOUS

## 2020-02-09 MED ORDER — MISOPROSTOL 50MCG HALF TABLET
50.0000 ug | ORAL_TABLET | ORAL | Status: DC | PRN
  Administered 2020-02-09: 50 ug via ORAL
  Filled 2020-02-09: qty 1

## 2020-02-09 MED ORDER — LACTATED RINGERS IV SOLN: 500.0000 mL | Freq: Once | INTRAVENOUS | Status: DC

## 2020-02-09 MED ORDER — DIPHENHYDRAMINE HCL 25 MG PO CAPS: 25.0000 mg | ORAL_CAPSULE | Freq: Four times a day (QID) | ORAL | Status: DC | PRN

## 2020-02-09 MED ORDER — FENTANYL-BUPIVACAINE-NACL 0.5-0.125-0.9 MG/250ML-% EP SOLN: 12.0000 mL/h | EPIDURAL | Status: DC | PRN

## 2020-02-09 MED ORDER — OXYTOCIN BOLUS FROM INFUSION
333.0000 mL | Freq: Once | INTRAVENOUS | Status: AC
  Administered 2020-02-09: 333 mL via INTRAVENOUS

## 2020-02-09 MED ORDER — ONDANSETRON HCL 4 MG PO TABS: 4.0000 mg | ORAL_TABLET | ORAL | Status: DC | PRN

## 2020-02-09 MED ORDER — BENZOCAINE-MENTHOL 20-0.5 % EX AERO
1.0000 "application " | INHALATION_SPRAY | CUTANEOUS | Status: DC | PRN
  Administered 2020-02-09: 1 via TOPICAL
  Filled 2020-02-09: qty 56

## 2020-02-09 NOTE — Progress Notes (Signed)
Labor Progress Note Naydeen Speirs is a 29 y.o. G2P1001 at [redacted]w[redacted]d presented for elective labor  S: Mildly increased contraction strength. Overall much less discomfort compared to previous labor.  O:  BP 121/82   Pulse 75   Temp 98.3 F (36.8 C) (Oral)   Resp 17   Ht 5\' 9"  (1.753 m)   Wt 85.7 kg   LMP 05/03/2019   SpO2 99%   BMI 27.90 kg/m  EFM: 125/moderate var/pos accels, neg decels  CVE: Dilation: 4 Effacement (%): 60 Station: -3 Presentation: Vertex Exam by:: Dr. [email or web address]   A&P: 29 y.o. G2P1001 [redacted]w[redacted]d elective IOL.  #Labor: Progressing well. Plan to start Pit. #Pain: IV PRN. Would like to avoid epi  #FWB: Cat I #GBS negative   [redacted]w[redacted]d, MD 3:33 PM

## 2020-02-09 NOTE — H&P (Addendum)
OBSTETRIC ADMISSION HISTORY AND PHYSICAL  Megan Rocha is a 29 y.o. female G2P1001 with IUP at 3w2dby  presenting for elective induction. She reports +FMs, No LOF, no VB, no blurry vision, headaches or peripheral edema, and RUQ pain.  She plans on breast and bottle feeding. She request nothing for birth control. [facility]   Dating: By L/19 --->  Estimated Date of Delivery: 02/07/20  Sono:  _0 , CWD, normal (cardiac focus) anatomy, variable presentation, 280g, 58% EFW  Prenatal History/Complications:  Difficulty controlling anger ASCUS HPV+ Fetal echogenic focus Thrombocytopenia oc pregnancy Depression during the pregnancy, discussed Zoloft not started.  Past Medical History: Past Medical History:  Diagnosis Date   BV (bacterial vaginosis)    PONV (postoperative nausea and vomiting)    from epidural   UTI (urinary tract infection)    Yeast vaginitis     Past Surgical History: Past Surgical History:  Procedure Laterality Date   colposcopy      Obstetrical History: OB History     Gravida  2   Para  1   Term  1   Preterm      AB      Living  1      SAB      TAB      Ectopic      Multiple      Live Births  1           Social History Social History   Socioeconomic History   Marital status: Single    Spouse name: Not on file   Number of children: Not on file   Years of education: Not on file   Highest education level: Not on file  Occupational History   Not on file  Tobacco Use   Smoking status: Never Smoker   Smokeless tobacco: Never Used  Vaping Use   Vaping Use: Never used  Substance and Sexual Activity   Alcohol use: Not Currently    Comment: occasional    Drug use: Not Currently    Types: Marijuana    Comment: used daily until + pregnancy   Sexual activity: Yes    Birth control/protection: None  Other Topics Concern   Not on file  Social History Narrative   Not on file   Social Determinants  of Health   Financial Resource Strain:    Difficulty of Paying Living Expenses: Not on file  Food Insecurity: No Food Insecurity   Worried About Running Out of Food in the Last Year: Never true   Ran Out of Food in the Last Year: Never true  Transportation Needs: No Transportation Needs   Lack of Transportation (Medical): No   Lack of Transportation (Non-Medical): No  Physical Activity:    Days of Exercise per Week: Not on file   Minutes of Exercise per Session: Not on file  Stress:    Feeling of Stress : Not on file  Social Connections:    Frequency of Communication with Friends and Family: Not on file   Frequency of Social Gatherings with Friends and Family: Not on file   Attends Religious Services: Not on file   Active Member of Clubs or Organizations: Not on file   Attends CArchivistMeetings: Not on file   Marital Status: Not on file    Family History: Family History  Problem Relation Age of Onset   Breast cancer Paternal Grandmother     Allergies: Allergies  Allergen Reactions   Penicillins Anaphylaxis    Medications Prior to Admission  Medication Sig Dispense Refill Last Dose   acetaminophen (TYLENOL) 500 MG tablet Take 1,000 mg by mouth every 6 (six) hours as needed.      Blood Pressure Monitoring (BLOOD PRESSURE KIT) DEVI 1 Device by Does not apply route daily. ICD 10: Z34.00 1 each 0    doxylamine, Sleep, (UNISOM) 25 MG tablet Take 1 tablet (25 mg total) by mouth at bedtime as needed. 30 tablet 2    hydrOXYzine (ATARAX/VISTARIL) 25 MG tablet Take 1 tablet (25 mg total) by mouth every 6 (six) hours as needed for anxiety. (Patient not taking: Reported on 01/11/2020) 30 tablet 2    Prenatal Vit-Fe Fumarate-FA (PRENATAL VITAMINS PO) Take 1 tablet by mouth daily.      promethazine (PHENERGAN) 12.5 MG tablet Take 1 tablet (12.5 mg total) by mouth every 6 (six) hours as needed for nausea or vomiting. 30 tablet 1    sertraline (ZOLOFT) 25 MG tablet Take 2  tablets (50 mg total) by mouth daily. (Patient not taking: Reported on 01/11/2020) 30 tablet 3     Review of Systems   All systems reviewed and negative except as stated in HPI  Height 5' 9" (1.753 m), weight 85.7 kg, last menstrual period 05/03/2019. General appearance: alert, cooperative and appears stated age Lungs: clear to auscultation bilaterally Heart: regular rate and rhythm Abdomen: soft, non-tender; bowel sounds normal Pelvic:  adequate Extremities: no sign of DVT Presentation: cephalic Fetal monitoringBaseline: 135 bpm, Variability: Good {> 6 bpm), Accelerations: Reactive and Decelerations: Absent Uterine activityNone     Prenatal labs: ABO, Rh: --/--/PENDING (08/19 1035) Antibody: PENDING (08/19 1035) Rubella: 1.08 (02/24 1537) RPR: Non Reactive (05/26 0928)  HBsAg: Negative (02/24 1537)  HIV: Non Reactive (05/26 0928)  GBS: Negative/-- (07/21 1528)  1 hr Glucola WNL Genetic screening  declined Anatomy US Normal (cardiac focus)  Prenatal Transfer Tool  Maternal Diabetes: No Genetic Screening: Declined Maternal Ultrasounds/Referrals: Normal Fetal Ultrasounds or other Referrals:  None Maternal Substance Abuse:  No Significant Maternal Medications:  None. Significant Maternal Lab Results: Group B Strep negative  Results for orders placed or performed during the hospital encounter of 02/09/20 (from the past 24 hour(s))  CBC   Collection Time: 02/09/20 10:33 AM  Result Value Ref Range   WBC 8.7 4.0 - 10.5 K/uL   RBC 3.65 (L) 3.87 - 5.11 MIL/uL   Hemoglobin 11.2 (L) 12.0 - 15.0 g/dL   HCT 33.5 (L) 36 - 46 %   MCV 91.8 80.0 - 100.0 fL   MCH 30.7 26.0 - 34.0 pg   MCHC 33.4 30.0 - 36.0 g/dL   RDW 12.8 11.5 - 15.5 %   Platelets 167 150 - 400 K/uL   nRBC 0.0 0.0 - 0.2 %  Type and screen   Collection Time: 02/09/20 10:35 AM  Result Value Ref Range   ABO/RH(D) PENDING    Antibody Screen PENDING    Sample Expiration      02/12/2020,2359 Performed at [facility], [place] [address], [place], [place] [address]     Patient Active Problem List   Diagnosis Date Noted   Post-dates pregnancy 02/09/2020   Depression affecting pregnancy in third trimester, antepartum 12/14/2019   Gestational thrombocytopenia ([place]) 11/17/2019   ASCUS with positive high risk HPV cervical 10/18/2019   Fetal cardiac echogenic focus 09/13/2019   Supervision of low-risk pregnancy 08/10/2019    Assessment/Plan:  Megan Rocha  is a 29 y.o. G2P1001 at 34w2dhere for elective IOL.  #Labor: will begin induction with buccal cytotec and FB.   #Pain: Would like to avoid epi. IV PRN for now. #FWB: Cat I #ID:  GBS negative #MOF: bottle/Breast #MOC: none #Circ:  N/A #Gestational Thrombocytopenia: plts 132 on 7/21 > plts 167 on admission #H/o ASCUS with +HRHPV, CIN1 in 10/2019. Plan for co-testing in 10/2020. #Depression: no reported safety concerns on admission. No current medications. Plan for 1 week postpartum mood check.  PMatilde Haymaker MD  02/09/2020, 11:06 AM  Attestation of Supervision of Student:  I confirm that I have verified the information documented in the  resident's  note and that I have also personally reperformed the history, physical exam and all medical decision making activities.  I have verified that all services and findings are accurately documented in this student's note; and I agree with management and plan as outlined in the documentation. I have also made any necessary editorial changes.  ARanda Ngo MMaloyfor [facility] [place]8/19/2021 6:14 PM

## 2020-02-09 NOTE — Discharge Summary (Signed)
Postpartum Discharge Summary      Patient Name: Megan Rocha DOB: 03/30/1991 MRN: [id number]  Date of admission: 02/09/2020 Delivery date:02/09/2020  Delivering provider: Matilde Haymaker  Date of discharge: 02/11/2020  Admitting diagnosis: Post-dates pregnancy [O48.0] Intrauterine pregnancy: [redacted]w[redacted]d    Secondary diagnosis:  Principal Problem:   Vaginal delivery Active Problems:   Fetal cardiac echogenic focus   ASCUS with positive high risk HPV cervical   [place]ational thrombocytopenia ([place]   Depression affecting pregnancy in third trimester, antepartum   Post-dates pregnancy  Additional problems:    Discharge diagnosis: Term Pregnancy Delivered                                              Post partum procedures:none Augmentation: AROM, Pitocin, Cytotec and IP Foley Complications: None  Hospital course: Induction of Labor With Vaginal Delivery   29y.o. yo G2P2002 at 462w2das admitted to the hospital 02/09/2020 for induction of labor.  Indication for induction: Elective.  Patient had an uncomplicated labor course as follows: Membrane Rupture Time/Date: 7:27 PM ,02/09/2020   Delivery Method:Vaginal, Spontaneous  Episiotomy: None  Lacerations:  Perineal;2nd degree  s/p repair Details of delivery can be found in separate delivery note.  Patient had a routine postpartum course. Patient is discharged home 02/11/20.  Newborn Data: Birth date:02/09/2020  Birth time:7:52 PM  Gender:Female  Living status:Living  Apgars:8 ,8  Weight:2980 g   Magnesium Sulfate received: No BMZ received: No Rhophylac:N/A MMR:N/A T-DaP:declined Flu: No Transfusion:No  Physical exam  Vitals:   02/10/20 0800 02/10/20 1456 02/10/20 2000 02/11/20 0520  BP: 117/76 111/83 110/85 116/84  Pulse: 70 72 68 70  Resp: '18 18 18 18  ' Temp: 99.1 F (37.3 C) 99 F (37.2 C) 98.3 F (36.8 C) 98.6 F (37 C)  TempSrc: Oral Oral Oral Oral  SpO2:   100%   Weight:      Height:       General: alert,  cooperative and no distress Lochia: appropriate Uterine Fundus: firm Incision: NA  DVT Evaluation: No evidence of DVT seen on physical exam. Labs: Lab Results  Component Value Date   WBC 8.7 02/09/2020   HGB 11.2 (L) 02/09/2020   HCT 33.5 (L) 02/09/2020   MCV 91.8 02/09/2020   PLT 167 02/09/2020   CMP Latest Ref Rng & Units 07/31/2019  Glucose 70 - 99 mg/dL 136(H)  BUN 6 - 20 mg/dL 8  Creatinine 0.44 - 1.00 mg/dL 0.40(L)  Sodium 135 - 145 mmol/L 138  Potassium 3.5 - 5.1 mmol/L 3.2(L)  Chloride 98 - 111 mmol/L 104   Edinburgh Score: Edinburgh Postnatal Depression Scale Screening Tool 02/09/2020  I have been able to laugh and see the funny side of things. 0  I have looked forward with enjoyment to things. 1  I have blamed myself unnecessarily when things went wrong. 1  I have been anxious or worried for no good reason. 2  I have felt scared or panicky for no good reason. 2  Things have been getting on top of me. 2  I have been so unhappy that I have had difficulty sleeping. 2  I have felt sad or miserable. 2  I have been so unhappy that I have been crying. 1  The thought of harming myself has occurred to me. 0  Edinburgh Postnatal Depression Scale Total 13  After visit meds:  Allergies as of 02/11/2020      Reactions   Penicillins Anaphylaxis      Medication List    STOP taking these medications   sertraline 25 MG tablet Commonly known as: Zoloft     TAKE these medications   acetaminophen 500 MG tablet Commonly known as: TYLENOL Take 1,000 mg by mouth every 6 (six) hours as needed for mild pain. What changed: Another medication with the same name was added. Make sure you understand how and when to take each.   acetaminophen 325 MG tablet Commonly known as: Tylenol Take 2 tablets (650 mg total) by mouth every 4 (four) hours as needed (for pain scale < 4). What changed: You were already taking a medication with the same name, and this prescription was added.  Make sure you understand how and when to take each.   Blood Pressure Kit Devi 1 Device by Does not apply route daily. ICD 10: Z34.00   doxylamine (Sleep) 25 MG tablet Commonly known as: UNISOM Take 1 tablet (25 mg total) by mouth at bedtime as needed.   hydrOXYzine 25 MG tablet Commonly known as: ATARAX/VISTARIL Take 1 tablet (25 mg total) by mouth every 6 (six) hours as needed for anxiety.   ibuprofen 600 MG tablet Commonly known as: ADVIL Take 1 tablet (600 mg total) by mouth every 6 (six) hours.   PRENATAL VITAMINS PO Take 1 tablet by mouth daily.   promethazine 12.5 MG tablet Commonly known as: PHENERGAN Take 1 tablet (12.5 mg total) by mouth every 6 (six) hours as needed for nausea or vomiting.       Discharge home in stable condition Infant Feeding: Bottle Infant Disposition:home with mother Discharge instruction: per After Visit Summary and Postpartum booklet. Activity: Advance as tolerated. Pelvic rest for 6 weeks.  Diet: routine diet Future Appointments: Future Appointments  Date Time Provider [place]  02/13/2020  9:20 AM MC-SCREENING [facility] None  02/24/2020 10:15 AM [facility]-BEHAVIORAL HEALTH CLINICIAN [facility]  03/13/2020  4:09 PM Arrie Senate, MD [facility]   Follow up Visit:  [place] for [facility]. Schedule an appointment as soon as possible for a visit in 6 week(s).   Specialty: Obstetrics and Gynecology Contact information: [place] [id number] [phone]               Please schedule this patient for a In person postpartum visit in 4 weeks with the following provider: Any provider. Additional Postpartum F/U:Postpartum Depression checkup  Low risk pregnancy complicated by: getational thrombocytopenia, history of childhood trauma, history of anxiety/depression, h/o ASCUS with +HRHPV & CIN1 (10/2019) >plan for co-testing 10/2020   Delivery mode:  Vaginal, Spontaneous  Anticipated Birth Control: condoms, declines alternative options    02/11/2020 Janet Berlin, MD

## 2020-02-10 MED ORDER — HYDROXYZINE HCL 25 MG PO TABS
25.0000 mg | ORAL_TABLET | Freq: Four times a day (QID) | ORAL | Status: DC | PRN
  Administered 2020-02-10: 25 mg via ORAL
  Filled 2020-02-10: qty 1

## 2020-02-10 NOTE — Progress Notes (Signed)
RNs entered pt room to do bedside report.  Upon opening door pt was holding infant while raising her voice and cursing on a phone call.  Before RNs could explain the purpose of entry the Pt raised her voices at nurses "I'm not fucking doing this right now. Leave me alone."  RNs stepped out of room.  Informed LCSWA Wiley.

## 2020-02-10 NOTE — Progress Notes (Signed)
CSW consulted for hx of depression as well as scoring 13 on Edinburgh. CSW congratulated MOB and FOB on the birth of infant. CSW advised MOB of the HIPPA policy, CSW's role and the reason for MOB coming to see her. CSW observed that when CSW expressed reason for visiting MOB only nodded and expressed "I really dont want to speak about it in front of him but he's skin to skin so its okay". CSW understanding and offered to return once FOB has left. MOB agreeable and thanked CSW for this.     Megan Rocha S. Eren Puebla, MSW, LCSW [facility] [place] [phone]   

## 2020-02-10 NOTE — Lactation Note (Signed)
This note was copied from a baby's chart. Lactation Consultation Note  Patient Name: Megan Rocha YIRSW'N Date: 02/10/2020 Reason for consult: Initial assessment;1st time breastfeeding   Mother is a P2, infant is 30 hours old . Mother reports that this is her first time breastfeeding. She reports that she intended to bottle feed and infant refused her bottle and she knew she needed to eat so she just latched her own.  Mother is very excited that infant is breast feeding. Mother request LC to see her. She has many questions.    Mother was given [facility] brochure and basic teaching done.  Mother reports that infant is feeding well. Mother reports that infant just had a 21 min feeding . Infant is sleeping .   Reviewed hand expression with mother. Observed large drops of colostrum. Mother was given a harmony hand pump with instructions. Mothers nipples are erect with compressible breast tissue. No observed trama of mothers nipples.   She is active with WIC . Lots of discussion on cue base feeding and frequent STS, supply and demand and cluster feeding. Reviewed positioning and using good pillow support.   Mother to page for [facility] when infant is awake and ready for next feeding.  Mother to continue to cue base feed infant and feed at least 8-12 times or more in 24 hours and advised to allow for cluster feeding infant as needed.   Mother to continue to due STS. Mother is aware of available LC services at [facility], BFSG'S, OP Dept, and phone # for questions or concerns about breastfeeding.  Mother receptive to all teaching and plan of care.     Maternal Data Has patient been taught Hand Expression?: Yes Does the patient have breastfeeding experience prior to this delivery?: No  Feeding Feeding Type:  (mother to page for next feeding assistance)  LATCH Score                   Interventions Interventions: Breast feeding basics reviewed  Lactation Tools Discussed/Used WIC Program:  Yes Pump Review: Setup, frequency, and cleaning;Milk Storage Initiated by:: Stevan Born RN,IBCLC Date initiated:: 02/10/20   Consult Status Consult Status: Follow-up Date: 02/11/20 Follow-up type: In-patient    Stevan Born [facility] 02/10/2020, 2:22 PM

## 2020-02-10 NOTE — Progress Notes (Signed)
CSW received consult due to score 13 on Edinburgh Depression Screen.    CSW congratulated MOB on the birth of infant once more and advised MOB further in detail the reason for CSW coming to speak with her. MOB reported that she doesn't have a diagnosis of depression clinically but does report that she deals with depression and anxiety at times. MOB reported that she was told to have depression at the start of this pregnancy and was prescribed Zoloft. MOB reports that she hasn't taken this medication due to the concerns of not being able to continue  it with no insurance. CSW offered to have FC speak with her regarding Medicaid need and MOB is agreeable to this. CSW has reached out to Nita with FC and left message at this time. MOB went on to tell CSW that she has anger issues which MOB reports being in therapy at this time for. MOB expressed that her therapist is very helpful in helping her. MOB reported feelings of sadness after the birth of oldest child. MOB reported that she feels that she dealt with PPD at that time which lasted "a few weeks". MOB expressed to this CSW that she hasn't been feeling SI or HI and denies DV to this CSW as well.   MOB reported that she has all needed items to care for infant with plans for infant to be seen at [facility]. MOB reported that she has a basinet and crib for infant to sleep in once arrived home.   CSW provided education regarding Baby Blues vs PMADs and provided MOB with resources for mental health follow up.  CSW encouraged MOB to evaluate her mental health throughout the postpartum period with the use of the New Mom Checklist developed by Postpartum Progress as well as the Edinburgh Postnatal Depression Scale and notify a medical professional if symptoms arise.     Megan Rocha, MSW, LCSW [facility] [place] [phone]  

## 2020-02-10 NOTE — Progress Notes (Signed)
POSTPARTUM PROGRESS NOTE  Subjective: Megan Rocha is a 29 y.o. [id number] s/p SVD at [redacted]w[redacted]d.  She reports she doing well. No acute events overnight. She denies any problems with ambulating, voiding or po intake. Denies nausea or vomiting. She has passed flatus. Pain is well controlled.  Lochia is moderate. Bottle feeding only, concerned because infant has had minimal intake.   Objective: Blood pressure 129/79, pulse (!) 52, temperature 98.2 F (36.8 C), temperature source Oral, resp. rate 16, height 5\' 9"  (1.753 m), weight 85.7 kg, last menstrual period 05/03/2019, SpO2 100 %, unknown if currently breastfeeding.  Physical Exam:  General: alert, cooperative and no distress Chest: no respiratory distress Abdomen: soft, non-tender  Uterine Fundus: firm and at level of umbilicus Extremities: No calf swelling or tenderness  no edema  Recent Labs    02/09/20 1033  HGB 11.2*  HCT 33.5*    Assessment/Plan: Megan Rocha is a 29 y.o. 30 who is PPD#1 s/p SVD at [redacted]w[redacted]d.   Routine Postpartum Care: Doing well, pain well-controlled.  -- Continue routine care, support with bottle feeding -- Contraception: plans to use condoms; discussed pelvic rest and recommendations to avoid close-interval pregnancies.  -- Feeding: bottle only   --History of depression: recommend outpatient depression f/u     Dispo: Plan for discharge either late tonight or PPD#2.  [redacted]w[redacted]d, MD OB Fellow, Faculty Practice 02/10/2020 5:59 AM

## 2020-02-11 MED ORDER — ACETAMINOPHEN 325 MG PO TABS: 650.0000 mg | ORAL_TABLET | ORAL | Status: DC | PRN

## 2020-02-11 MED ORDER — IBUPROFEN 600 MG PO TABS: 600.0000 mg | ORAL_TABLET | Freq: Four times a day (QID) | ORAL | 0 refills | Status: DC

## 2020-02-11 NOTE — Lactation Note (Signed)
This note was copied from a baby's chart. Lactation Consultation Note  Patient Name: Megan Rocha Megan Rocha Date: 02/11/2020 Reason for consult: Follow-up assessment;1st time breastfeeding;Term;Infant weight loss  Baby is 38 hours old / 6 % weight loss /  Per mom came in initially and was going to formula feed and changed to breast / formula. Baby last fed at 9:30 for 10 mins with swallows.  Per mom latching is going very well and having no soreness . Breast are fuller and warmer especially when feeding.  Mom requested for the [facility] to check her breast for engorgement.  LC noted the lateral aspect on the left breast to have some swollen nodules and fuller than the right. Breast weren't engorged.  Sore nipple and engorgement prevention and tx reviewed.  Mom mentioned she was given a hand pump yesterday. Storage of breast milk reviewed.  Mom has the [facility] pamphlet with phone numbers and is aware of the Website - conehealthy baby.    Maternal Data    Feeding Feeding Type:  (per mom baby last fed at 930 for 10 mins)  LATCH Score                   Interventions Interventions: Breast feeding basics reviewed;Hand pump  Lactation Tools Discussed/Used Tools: Pump Breast pump type: Manual Pump Review: Milk Storage   Consult Status Consult Status: Complete Date: 02/11/20    Kathrin Greathouse 02/11/2020, 10:19 AM

## 2020-02-12 LAB — RPR: RPR Ser Ql: NONREACTIVE — AB

## 2020-02-13 ENCOUNTER — Other Ambulatory Visit (HOSPITAL_COMMUNITY): Payer: Medicaid Other

## 2020-02-14 ENCOUNTER — Inpatient Hospital Stay (HOSPITAL_COMMUNITY): Payer: Medicaid Other

## 2020-02-14 ENCOUNTER — Inpatient Hospital Stay (HOSPITAL_COMMUNITY): Admission: RE | Admit: 2020-02-14 | Payer: Medicaid Other | Source: Home / Self Care | Admitting: Family Medicine

## 2020-02-14 ENCOUNTER — Telehealth: Payer: Self-pay | Admitting: *Deleted

## 2020-02-14 NOTE — Telephone Encounter (Deleted)
Contacted patient to complete transition of care assessment:  Transition Care Management Follow-up Telephone Call  . Medicaid Managed Care Transition Call Status:MM [facility] Call Made  . Date of discharge and from where:  [facility], 02/11/20  . How have you been since you were released from the hospital? "feel ok"  . Any questions or concerns? No  Items Reviewed: Marland Kitchen Did the pt receive and understand the discharge instructions provided? Yes  . Medications obtained and verified? Yes  . Any new allergies since your discharge? Yes  . Dietary orders reviewed? Yes . Do you have support at home?  Yes, family  Functional Questionnaire: (I = Independent and D = Dependent)  ADLs: Independent Bathing/Dressing:Independent Meal Prep: Independent Eating: Independent Maintaining continence: Independent Transferring/Ambulation: Independent Managing Meds: Independent   Follow up appointments reviewed:   PCP Hospital f/u appt confirmed? No Patient would like to be assigned a PCP  Specialist Hospital f/u appt confirmed? Scheduled to see Dr Mart Piggs on 03/13/20 @ 1535  Are transportation arrangements needed? No   If their condition worsens, is the pt aware to call PCP or go to the EmergencyDept.? Yes  Was the patient provided with contact information for the PCP's office or ED? Yes  Was to pt encouraged to call back with questions or concerns?  Yes  Burnard Bunting, RN, BSN, CCRN Patient Engagement Center [phone]

## 2020-02-14 NOTE — Telephone Encounter (Signed)
Transition Care Management Follow-up Telephone Call  . Medicaid Managed Care Transition Call Status:MM [facility] Call Made  . Date of discharge and from where:  [facility], 02/11/20  . How have you been since you were released from the hospital? "feel ok"  . Any questions or concerns? No  Items Reviewed: Marland Kitchen Did the pt receive and understand the discharge instructions provided? Yes  . Medications obtained and verified? Yes  . Any new allergies since your discharge? Yes  . Dietary orders reviewed? Yes . Do you have support at home?  Yes, family  Functional Questionnaire: (I = Independent and D = Dependent)  ADLs: Independent Bathing/Dressing: Independent Meal Prep: Independent Eating: Independent Maintaining continence: Independent Transferring/Ambulation: Independent Managing Meds: Independent   Follow up appointments reviewed:   PCP Hospital f/u appt confirmed? No Patient would like to be assigned a PCP  Specialist Hospital f/u appt confirmed? Scheduled to see Dr Mart Piggs on 03/13/20 @ 1535  Are transportation arrangements needed? No   If their condition worsens, is the pt aware to call PCP or go to the EmergencyDept.? Yes  Was the patient provided with contact information for the PCP's office or ED? Yes  Was to pt encouraged to call back with questions or concerns?  Yes

## 2020-02-14 NOTE — BH Specialist Note (Signed)
Pt requests to reschedule to virtual visit on 02/28/20 at 1:15pm

## 2020-02-14 NOTE — Telephone Encounter (Signed)
Contacted patient to schedule NP appointment ,patient declines at this time.                                    Megan Rocha                                         PEC                                  [phone]

## 2020-02-23 ENCOUNTER — Telehealth: Payer: Self-pay

## 2020-02-23 NOTE — Telephone Encounter (Signed)
[facility] RN Adaline Sill left VM on nurse line stating she spoke with patient today. Edinburgh screening performed; score of 16. Pt reported not taking Zoloft at this time. Pt reports appt tomorrow with [facility].   Per chart review, pt will be seen by [facility] provider tomorrow in our office. Call routed to [facility] Jamie's in-basket for review.

## 2020-02-24 ENCOUNTER — Other Ambulatory Visit: Payer: Self-pay

## 2020-02-24 ENCOUNTER — Ambulatory Visit: Payer: Self-pay | Admitting: Clinical

## 2020-02-24 ENCOUNTER — Encounter: Payer: Self-pay | Admitting: General Practice

## 2020-02-24 NOTE — BH Specialist Note (Signed)
Integrated Behavioral Health via Telemedicine Video (Caregility) Visit  02/24/2020 Megan Rocha [id number]  Number of Integrated Behavioral Health visits: 5 Session Start time: 1:18  Session End time: 1:45 Total time: 27 minutes  Referring Provider: Donia Ast, NP Type of Service: Individual, Family, Etc. Patient/Family location: Home [facility] Provider location: Center for [employer] at [facility]  All persons participating in visit: Patient Megan Rocha and [facility] Megan Rocha [facility]    Discussed confidentiality: at previous visit  I connected with Megan Rocha by a video enabled telemedicine application (Caregility) and verified that I am speaking with the correct person using two identifiers.    I discussed that engaging in this virtual visit, they consent to the provision of behavioral healthcare and the services will be billed under their insurance.   Patient and/or legal guardian expressed understanding and consented to virtual visit: Yes   PRESENTING CONCERNS: Patient and/or family reports the following symptoms/concerns: Pt states her primary concern today is worry over starting BH medication if she loses her Medicaid and is unable to continue paying for them, along with social isolation postpartum. Pt requests refills of Vistaril and Zoloft, and agrees to referral to psychiatry for ongoing [facility] medication management.  Duration of problem: Ongoing; Severity of problem: moderately severe  STRENGTHS (Protective Factors/Coping Skills): Social connections, Social and Emotional competence and Concrete supports in place (healthy food, safe environments, etc.)  ASSESSMENT: Patient currently experiencing Mood disorder.    GOALS ADDRESSED: Patient will: 1.  Reduce symptoms of: anxiety, depression and stress  2.  Demonstrate ability to: Increase healthy adjustment to current life circumstances and Increase adequate support systems for patient/family  Progress  of Goals: Ongoing  INTERVENTIONS: Interventions utilized:  Medication Monitoring, Psychoeducation and/or Health Education and Link to [employer] Standardized Assessments completed & reviewed: PHQ9/GAD7 given within past two weeks   OUTCOME: Patient Response: Pt agrees to following treatment plan   PLAN: 1. Follow up with behavioral health clinician on : By phone in one week; Call Megan Rocha at [phone] if needed prior to one week 2. Behavioral recommendations:  -Continue taking BH medications as prescribed (Vistaril; Zoloft) -Continue using self-coping strategies daily that are helpful in managing symptoms -Consider registering for and attending new mom support group online at either [email or web address] or www.[email or web address].net 3. Referral(s): Integrated Art gallery manager (In Clinic) and [employer]:  New mom support  I discussed the assessment and treatment plan with the patient and/or parent/guardian. They were provided an opportunity to ask questions and all were answered. They agreed with the plan and demonstrated an understanding of the instructions.   They were advised to call back or seek an in-person evaluation if the symptoms worsen or if the condition fails to improve as anticipated.   Confirmed patient's address: Yes  Confirmed patient's phone number: Yes  Any changes to demographics: No   Confirmed patient's insurance: Yes  Any changes to patient's insurance: No   I discussed the limitations of evaluation and management by telemedicine and the availability of in person appointments.  I discussed that the purpose of this visit is to provide behavioral health care while limiting exposure to the novel coronavirus.   Discussed there is a possibility of technology failure and discussed alternative modes of communication if that failure occurs.  Megan Rocha [facility]  Depression screen [facility] 2/9 02/08/2020 01/23/2020 12/14/2019 12/09/2019 11/16/2019  Decreased  Interest 2 1 2 3 2   Down, Depressed, Hopeless 1 1 2 2 1   PHQ - 2 Score  3 2 4 5 3   Altered sleeping 3 3 3 1 3   Tired, decreased energy 3 3 3 3 1   Change in appetite 2 1 2 2 2   Feeling bad or failure about yourself  0 0 2 1 0  Trouble concentrating 2 2 2 2 1   Moving slowly or fidgety/restless 1 0 0 0 0  Suicidal thoughts - 0 0 0 0  PHQ-9 Score 14 11 16 14 10    GAD 7 : Generalized Anxiety Score 02/08/2020 01/23/2020 12/14/2019 12/09/2019  Nervous, Anxious, on Edge 2 2 2 1   Control/stop worrying 2 2 2 2   Worry too much - different things 2 1 2 2   Trouble relaxing 3 3 3 3   Restless 2 0 2 1  Easily annoyed or irritable 3 3 3 3   Afraid - awful might happen 1 2 2 3   Total GAD 7 Score 15 13 16  15

## 2020-02-28 ENCOUNTER — Other Ambulatory Visit: Payer: Self-pay | Admitting: Obstetrics and Gynecology

## 2020-02-28 ENCOUNTER — Ambulatory Visit (INDEPENDENT_AMBULATORY_CARE_PROVIDER_SITE_OTHER): Payer: Medicaid Other | Admitting: Clinical

## 2020-02-28 ENCOUNTER — Other Ambulatory Visit: Payer: Self-pay

## 2020-02-28 DIAGNOSIS — F39 Unspecified mood [affective] disorder: Secondary | ICD-10-CM | POA: Diagnosis not present

## 2020-02-28 NOTE — Patient Instructions (Signed)
Center for [facility] at [facility] for Women [address] [place], [place] [address] [phone] (main office) [phone] (Selig Wampole's office)  New mom online support:  [email or web address].net and/or [email or web address]

## 2020-02-29 ENCOUNTER — Other Ambulatory Visit: Payer: Self-pay | Admitting: Obstetrics and Gynecology

## 2020-02-29 MED ORDER — HYDROXYZINE HCL 25 MG PO TABS: 25.0000 mg | ORAL_TABLET | Freq: Four times a day (QID) | ORAL | 1 refills | Status: DC | PRN

## 2020-02-29 MED ORDER — SERTRALINE HCL 25 MG PO TABS
50.0000 mg | ORAL_TABLET | Freq: Every day | ORAL | 3 refills | Status: DC
Start: 2020-02-29 — End: 2020-02-29

## 2020-02-29 MED ORDER — HYDROXYZINE HCL 25 MG PO TABS
25.0000 mg | ORAL_TABLET | Freq: Four times a day (QID) | ORAL | 1 refills | Status: DC | PRN
Start: 2020-02-29 — End: 2020-02-29

## 2020-02-29 MED ORDER — SERTRALINE HCL 25 MG PO TABS
50.0000 mg | ORAL_TABLET | Freq: Every day | ORAL | 3 refills | Status: DC
Start: 2020-02-29 — End: 2020-03-27

## 2020-03-01 ENCOUNTER — Ambulatory Visit (HOSPITAL_COMMUNITY): Payer: Medicaid Other | Admitting: Clinical

## 2020-03-01 ENCOUNTER — Telehealth (HOSPITAL_COMMUNITY): Payer: Self-pay | Admitting: Clinical

## 2020-03-01 ENCOUNTER — Other Ambulatory Visit: Payer: Self-pay

## 2020-03-01 NOTE — Telephone Encounter (Signed)
Therapist attempted tele -phone call to the client mobile phone number on file to reach the client in regards to no showing for virtual MyChart visit. Therapist sent a link via text message to the cell phone number on file for the client but the client did not check in for the appointment. The client cell phone and home phone number are listed as the same. Therapist left a voice mail with the office phone number for the client to call the office to reschedule her appointment.

## 2020-03-13 ENCOUNTER — Ambulatory Visit: Payer: Self-pay | Admitting: Family Medicine

## 2020-03-19 ENCOUNTER — Ambulatory Visit (INDEPENDENT_AMBULATORY_CARE_PROVIDER_SITE_OTHER): Payer: Medicaid Other | Admitting: Student

## 2020-03-19 ENCOUNTER — Encounter: Payer: Self-pay | Admitting: Student

## 2020-03-19 ENCOUNTER — Ambulatory Visit (INDEPENDENT_AMBULATORY_CARE_PROVIDER_SITE_OTHER): Payer: Medicaid Other | Admitting: Clinical

## 2020-03-19 ENCOUNTER — Other Ambulatory Visit: Payer: Self-pay

## 2020-03-19 DIAGNOSIS — Z1331 Encounter for screening for depression: Secondary | ICD-10-CM

## 2020-03-19 DIAGNOSIS — F39 Unspecified mood [affective] disorder: Secondary | ICD-10-CM

## 2020-03-19 NOTE — BH Specialist Note (Signed)
Less than 10 minute visit, in-person. Pt missed her first virtual appointment with psychiatry ([place] location), and agrees to call therapist to reschedule for initial visit, as requested by therapist.   Pt is having some conflicting feelings  over whether or not she should take Vistaril, as she feels it may harm her in some way. Pt is taking Zoloft as prescribed.  Pt agrees that if she feels she begins having suicidal ideations (denies any SI at this time), or at any time feels she is having a behavioral health emergency, she will go to 24/7 walk-in to Urgent Care [facility], [address], [place], [place] [address].    Pt agrees to follow-up call in one week with [facility].

## 2020-03-19 NOTE — Progress Notes (Signed)
Post Partum Visit Note  Megan Rocha is a 29 y.o. G24P2002 female who presents for a postpartum visit. She is 6 weeks postpartum following a SVD.  I have fully reviewed the prenatal and intrapartum course. The delivery was at 40.2 gestational weeks.  Anesthesia: local. Postpartum course has been good. Baby is doing well. Baby is feeding by bottle - Enfamil Lactofree. Bleeding no bleeding. Bowel function is normal. Bladder function is normal. Patient is not sexually active. Contraception method is none. Postpartum depression screening:positive.   The pregnancy intention screening data noted above was reviewed. Potential methods of contraception were discussed. The patient elected to proceed with nothing.     Edinburgh Postnatal Depression Scale - 03/19/20 1455      Edinburgh Postnatal Depression Scale:  In the Past 7 Days   I have been able to laugh and see the funny side of things. 0    I have looked forward with enjoyment to things. 0    I have blamed myself unnecessarily when things went wrong. 1    I have been anxious or worried for no good reason. 2    I have felt scared or panicky for no good reason. 2    Things have been getting on top of me. 0    I have been so unhappy that I have had difficulty sleeping. 2    I have felt sad or miserable. 2    I have been so unhappy that I have been crying. 3    The thought of harming myself has occurred to me. 0    Edinburgh Postnatal Depression Scale Total 12            The following portions of the patient's history were reviewed and updated as appropriate: allergies, current medications, past family history, past medical history, past social history, past surgical history and problem list.  Review of Systems Pertinent items are noted in HPI.    Objective:  Blood pressure 114/70, pulse 96, height 5\' 9"  (1.753 m), weight 163 lb (73.9 kg), last menstrual period 05/03/2019, unknown if currently breastfeeding.  General:  alert,  cooperative and no distress   Breasts:  inspection negative, no nipple discharge or bleeding, no masses or nodularity palpable  Lungs: clear to auscultation bilaterally  Heart:  regular rate and rhythm, S1, S2 normal, no murmur, click, rub or gallop  Abdomen: soft, non-tender; bowel sounds normal; no masses,  no organomegaly   Vulva:  not evaluated  Vagina: not evaluated  Cervix:  Not evaluated  Corpus: not examined  Adnexa:  not evaluated  Rectal Exam: Not performed.        Assessment:   Healthy postpartum exam. Pap 08/17/2019 Patient tearful ; 08/19/2019 will see patient before she leaves.  Plan:   Essential components of care per ACOG recommendations:  1.  Mood and well being: Patient with positive depression screening today. Reviewed local resources for support.  - Patient does not use tobacco.  - hx of drug use? No   If yes, discussed support systems -Patient has referral to [facility] and will follow up with [facility] in one week.   2. Infant care and feeding:  -Patient currently breastmilk feeding? No  -Social determinants of health (SDOH) reviewed in EPIC. No concerns  3. Sexuality, contraception and birth spacing - Patient does not want a pregnancy in the next year.  Desired family size is 4 children.  - Reviewed forms of contraception in tiered fashion. Patient desired no method  but her husband will use condoms.    - Discussed birth spacing of 18 months  4. Sleep and fatigue -Encouraged family/partner/community support of 4 hrs of uninterrupted sleep to help with mood and fatigue  5. Physical Recovery  - Discussed patients delivery  and complications - Patient had a 2nd degree laceration, perineal healing reviewed. Patient expressed understanding - Patient has urinary incontinence? No - Patient is safe to resume physical and sexual activity  6.  Health Maintenance - Last pap smear done 07/2019  7. Chronic Disease - PCP follow up  Marylene Land, CNM Center for  [facility], [facility]

## 2020-03-26 ENCOUNTER — Telehealth: Payer: Self-pay | Admitting: Clinical

## 2020-03-26 NOTE — Telephone Encounter (Signed)
Follow up call with pt, as agreed-upon at last visit: pt says she is taking Zoloft as prescribed ("really tired" the first day, and "a little tired" subsequent days; taking Vistaril once/day "don't know if it's working" as still feeling anxious, uncertain about taking more than one/day and running out. Pt is reminded that Vistaril can be taken every 6 hours as needed; also reminded to contact therapist from [facility] to reschedule initial appointment.   Pt agrees to:   1. Call back therapist at [facility] to reschedule initial appointment; talk to psychiatrist about taking over [facility] medication management  2. Continue taking Zoloft and Vistaril as prescribed. Refill will be sent for Vistaril and Zoloft until psychiatry takes over [facility] medication management  3. Call Megan Rocha at [phone] for any further questions or concerns   4. Go to Urgent Care [facility] at [address] [place], [place] [address] if needed for any behavioral health emergency 24/7 walk-in

## 2020-03-27 ENCOUNTER — Other Ambulatory Visit: Payer: Self-pay | Admitting: Obstetrics and Gynecology

## 2020-03-27 MED ORDER — SERTRALINE HCL 25 MG PO TABS
50.0000 mg | ORAL_TABLET | Freq: Every day | ORAL | 3 refills | Status: DC
Start: 2020-03-27 — End: 2020-09-06

## 2020-03-27 MED ORDER — HYDROXYZINE HCL 25 MG PO TABS: 25.0000 mg | ORAL_TABLET | Freq: Four times a day (QID) | ORAL | 1 refills | Status: DC | PRN

## 2020-06-20 ENCOUNTER — Ambulatory Visit (INDEPENDENT_AMBULATORY_CARE_PROVIDER_SITE_OTHER): Payer: Medicaid Other

## 2020-06-20 ENCOUNTER — Other Ambulatory Visit: Payer: Self-pay

## 2020-06-20 DIAGNOSIS — Z3201 Encounter for pregnancy test, result positive: Secondary | ICD-10-CM | POA: Diagnosis not present

## 2020-06-20 NOTE — Progress Notes (Signed)
Pt left urine for pregnancy test resulting positive.  Notified pt of results.  Pt reports LMP 05/03/20 which makes pt 6w 6d today and EDD 02/08/20.  Pt denies vaginal bleeding but is having some mild menstrual like cramping in the lower abdomen.  Pt advised if pain intensifies to please go to MAU.  Medications/allergies reviewed.  Pt advised that the front office will contact her in regards to scheduling her an appt to start prenatal care since she has been seen by [place] with her prior pregnancy.  Pt verbalized understanding with no further questions.   Addison Naegeli, RN  06/20/20

## 2020-06-21 LAB — POCT PREGNANCY, URINE: Preg Test, Ur: POSITIVE — AB

## 2020-06-21 NOTE — Progress Notes (Signed)
Patient was assessed and managed by nursing staff during this encounter. I have reviewed the chart and agree with the documentation and plan.  Trevious Rampey, PA-C 06/21/2020 10:35 AM   

## 2020-06-25 ENCOUNTER — Telehealth: Payer: Self-pay

## 2020-06-25 ENCOUNTER — Telehealth: Payer: Self-pay | Admitting: Family Medicine

## 2020-06-25 NOTE — Telephone Encounter (Signed)
Pt called front desk stating that she has tested + for Covid and she is [redacted] weeks pregnant. Wanted to know what she can take, advised Tylenol Cold, Robitussin plain/ DM.Pt verbalized understanding.

## 2020-06-25 NOTE — Telephone Encounter (Signed)
Pt called in and states she took a home Covid test and she is positive and 6 weeks preg. And wants to know if a RN could call her back about medication. thanks

## 2020-06-27 ENCOUNTER — Telehealth: Payer: Self-pay | Admitting: Family Medicine

## 2020-06-27 NOTE — Telephone Encounter (Signed)
Pt calling in and states that she spoke to someone today -- unknown name- and she was told to call office back if the bleeding increased. Pt states bleeding has increased and needs to speak with someone ASAP. thanks

## 2020-06-28 ENCOUNTER — Inpatient Hospital Stay (HOSPITAL_COMMUNITY)
Admission: AD | Admit: 2020-06-28 | Discharge: 2020-06-28 | Disposition: A | Discharge status: Home / Self Care | Payer: HRSA Program | Attending: Obstetrics and Gynecology | Admitting: Obstetrics and Gynecology

## 2020-06-28 ENCOUNTER — Inpatient Hospital Stay (HOSPITAL_COMMUNITY): Payer: Self-pay

## 2020-06-28 ENCOUNTER — Encounter (HOSPITAL_COMMUNITY): Payer: Self-pay | Admitting: *Deleted

## 2020-06-28 DIAGNOSIS — U071 COVID-19: Secondary | ICD-10-CM | POA: Insufficient documentation

## 2020-06-28 DIAGNOSIS — O4690 Antepartum hemorrhage, unspecified, unspecified trimester: Secondary | ICD-10-CM

## 2020-06-28 DIAGNOSIS — Z880 Allergy status to penicillin: Secondary | ICD-10-CM | POA: Insufficient documentation

## 2020-06-28 DIAGNOSIS — O4691 Antepartum hemorrhage, unspecified, first trimester: Secondary | ICD-10-CM | POA: Diagnosis not present

## 2020-06-28 DIAGNOSIS — O98511 Other viral diseases complicating pregnancy, first trimester: Secondary | ICD-10-CM | POA: Diagnosis not present

## 2020-06-28 DIAGNOSIS — O209 Hemorrhage in early pregnancy, unspecified: Secondary | ICD-10-CM | POA: Insufficient documentation

## 2020-06-28 DIAGNOSIS — Z3A08 8 weeks gestation of pregnancy: Secondary | ICD-10-CM | POA: Insufficient documentation

## 2020-06-28 DIAGNOSIS — O3680X0 Pregnancy with inconclusive fetal viability, not applicable or unspecified: Secondary | ICD-10-CM | POA: Diagnosis not present

## 2020-06-28 DIAGNOSIS — Z6790 Unspecified blood type, Rh positive: Secondary | ICD-10-CM

## 2020-06-28 LAB — COMPREHENSIVE METABOLIC PANEL
ALT: 22 U/L (ref 0–44)
AST: 21 U/L (ref 15–41)
Albumin: 3.9 g/dL (ref 3.5–5.0)
Alkaline Phosphatase: 40 U/L (ref 38–126)
Anion gap: 8 (ref 5–15)
BUN: 6 mg/dL (ref 6–20)
CO2: 24 mmol/L (ref 22–32)
Calcium: 9.1 mg/dL (ref 8.9–10.3)
Chloride: 106 mmol/L (ref 98–111)
Creatinine, Ser: 0.61 mg/dL (ref 0.44–1.00)
GFR, Estimated: >60 mL/min (ref >60)
Glucose, Bld: 86 mg/dL (ref 70–99)
Potassium: 4.7 mmol/L (ref 3.5–5.1)
Sodium: 138 mmol/L (ref 135–145)
Total Bilirubin: 0.3 mg/dL (ref 0.3–1.2)
Total Protein: 6.5 g/dL (ref 6.5–8.1)

## 2020-06-28 LAB — CBC
HCT: 37.4 % (ref 36.0–46.0)
Hemoglobin: 12.1 g/dL (ref 12.0–15.0)
MCH: 28.9 pg (ref 26.0–34.0)
MCHC: 32.4 g/dL (ref 30.0–36.0)
MCV: 89.5 fL (ref 80.0–100.0)
Platelets: 99 10*3/uL — ABNORMAL LOW (ref 150–400)
RBC: 4.18 MIL/uL (ref 3.87–5.11)
RDW: 13.1 % (ref 11.5–15.5)
WBC: 1.8 10*3/uL — ABNORMAL LOW (ref 4.0–10.5)
nRBC: 0 % (ref 0.0–0.2)

## 2020-06-28 LAB — URINALYSIS, ROUTINE W REFLEX MICROSCOPIC
Bacteria, UA: NONE SEEN
Bilirubin Urine: NEGATIVE
Glucose, UA: NEGATIVE mg/dL
Ketones, ur: 5 mg/dL — AB
Leukocytes,Ua: NEGATIVE
Nitrite: NEGATIVE
Protein, ur: NEGATIVE mg/dL
Specific Gravity, Urine: 1.019 (ref 1.005–1.030)
pH: 5 (ref 5.0–8.0)

## 2020-06-28 LAB — RESP PANEL BY RT-PCR (FLU A&B, COVID) ARPGX2
Influenza A by PCR: NEGATIVE
Influenza B by PCR: NEGATIVE
SARS Coronavirus 2 by RT PCR: POSITIVE — AB

## 2020-06-28 LAB — WET PREP, GENITAL
Clue Cells Wet Prep HPF POC: NONE SEEN
Sperm: NONE SEEN
Trich, Wet Prep: NONE SEEN
Yeast Wet Prep HPF POC: NONE SEEN

## 2020-06-28 LAB — HCG, QUANTITATIVE, PREGNANCY: hCG, Beta Chain, Quant, S: 7235 m[IU]/mL — ABNORMAL HIGH (ref <5)

## 2020-06-28 NOTE — MAU Note (Signed)
Pt stated she had some spotting and cramping today. Called OB office and told her to come in. (+Covid symptoms started last week)

## 2020-06-28 NOTE — MAU Provider Note (Signed)
History     CSN: [id number]  Arrival date and time: 06/28/20 1023   Event Date/Time   First Provider Initiated Contact with Patient 06/28/20 1347      Chief Complaint  Patient presents with  . Vaginal Bleeding   [name] is a 30 y.o. G3P2002 at [redacted]w[redacted]d who presents to MAU for vaginal bleeding which began yesterday. Patient endorses spotting when wiping and wore one pad overnight last night which had minimal bleeding on it this morning. Patient also reports mild, intermittent cramping. Patient reports she tested positive for COVID at home about one week ago with a home test. Patient denies SOB or other URI symptoms aside from stuffy nose.  Passing blood clots? no Blood soaking clothes? no Lightheaded/dizzy? no Significant pelvic pain or cramping? no Passed any tissue? no  Pt denies vaginal discharge/odor/itching. Pt denies N/V, abdominal pain, constipation, diarrhea, or urinary problems. Pt denies fever, chills, fatigue, sweating or changes in appetite. Pt denies SOB or chest pain. Pt denies dizziness, HA, light-headedness, weakness.   OB History    Gravida  3   Para  2   Term  2   Preterm      AB      Living  2     SAB      IAB      Ectopic      Multiple  0   Live Births  2           Past Medical History:  Diagnosis Date  . BV (bacterial vaginosis)   . PONV (postoperative nausea and vomiting)    from epidural  . UTI (urinary tract infection)   . Yeast vaginitis     Past Surgical History:  Procedure Laterality Date  . colposcopy    . NO PAST SURGERIES      Family History  Problem Relation Age of Onset  . Breast cancer Paternal Grandmother     Social History   Tobacco Use  . Smoking status: Never Smoker  . Smokeless tobacco: Never Used  Vaping Use  . Vaping Use: Never used  Substance Use Topics  . Alcohol use: Not Currently    Comment: occasional   . 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 Penicillins Anaphylaxis    Medications Prior to Admission  Medication Sig Dispense Refill Last Dose  . acetaminophen (TYLENOL) 325 MG tablet Take 2 tablets (650 mg total) by mouth every 4 (four) hours as needed (for pain scale < 4).   06/27/2020 at Unknown time  . sertraline (ZOLOFT) 25 MG tablet Take 2 tablets (50 mg total) by mouth daily. 30 tablet 3 Unknown at Unknown time    Review of Systems  Constitutional: Negative for chills, diaphoresis, fatigue and fever.  HENT: Positive for congestion.   Eyes: Negative for visual disturbance.  Respiratory: Negative for shortness of breath.   Cardiovascular: Negative for chest pain.  Gastrointestinal: Negative for abdominal pain, constipation, diarrhea, nausea and vomiting.  Genitourinary: Positive for pelvic pain and vaginal bleeding. Negative for dysuria, flank pain, frequency, urgency and vaginal discharge.  Neurological: Negative for dizziness, weakness, light-headedness and headaches.   Physical Exam   Blood pressure 117/68, pulse 78, temperature 98.8 F (37.1 C), resp. rate 18, last menstrual period 05/03/2020, unknown if currently breastfeeding.  Patient Vitals for the past 24 hrs:  BP Temp Pulse Resp  06/28/20 1110 117/68 98.8 F (37.1 C) 78 18   Physical Exam Vitals and nursing note reviewed.  Constitutional:      General: She is not in acute distress.    Appearance: Normal appearance. She is not ill-appearing, toxic-appearing or diaphoretic.  HENT:     Head: Normocephalic and atraumatic.  Pulmonary:     Effort: Pulmonary effort is normal.  Neurological:     Mental Status: She is alert and oriented to person, place, and time.  Psychiatric:        Mood and Affect: Mood normal.        Behavior: Behavior normal.        Thought Content: Thought content normal.        Judgment: Judgment normal.    Results for orders placed or performed during the hospital  encounter of 06/28/20 (from the past 24 hour(s))  CBC     Status: Abnormal   Collection Time: 06/28/20 10:43 AM  Result Value Ref Range   WBC 1.8 (L) 4.0 - 10.5 K/uL   RBC 4.18 3.87 - 5.11 MIL/uL   Hemoglobin 12.1 12.0 - 15.0 g/dL   HCT 16.0 10.9 - 32.3 %   MCV 89.5 80.0 - 100.0 fL   MCH 28.9 26.0 - 34.0 pg   MCHC 32.4 30.0 - 36.0 g/dL   RDW 55.7 32.2 - 02.5 %   Platelets 99 (L) 150 - 400 K/uL   nRBC 0.0 0.0 - 0.2 %  Comprehensive metabolic panel     Status: None   Collection Time: 06/28/20 10:43 AM  Result Value Ref Range   Sodium 138 135 - 145 mmol/L   Potassium 4.7 3.5 - 5.1 mmol/L   Chloride 106 98 - 111 mmol/L   CO2 24 22 - 32 mmol/L   Glucose, Bld 86 70 - 99 mg/dL   BUN 6 6 - 20 mg/dL   Creatinine, Ser 4.27 0.44 - 1.00 mg/dL   Calcium 9.1 8.9 - 06.2 mg/dL   Total Protein 6.5 6.5 - 8.1 g/dL   Albumin 3.9 3.5 - 5.0 g/dL   AST 21 15 - 41 U/L   ALT 22 0 - 44 U/L   Alkaline Phosphatase 40 38 - 126 U/L   Total Bilirubin 0.3 0.3 - 1.2 mg/dL   GFR, Estimated >37 >62 mL/min   Anion gap 8 5 - 15  hCG, quantitative, pregnancy     Status: Abnormal   Collection Time: 06/28/20 10:43 AM  Result Value Ref Range   hCG, Beta Chain, Quant, S 7,235 (H) <5 mIU/mL  Wet prep, genital     Status: Abnormal   Collection Time: 06/28/20 11:10 AM   Specimen: PATH Cytology Cervicovaginal Ancillary Only  Result Value Ref Range   Yeast Wet Prep HPF POC NONE SEEN NONE SEEN   Trich, Wet Prep NONE SEEN NONE SEEN   Clue Cells Wet Prep HPF POC NONE SEEN NONE SEEN   WBC, Wet Prep HPF POC MANY (A) NONE SEEN   Sperm NONE SEEN   Urinalysis, Routine w reflex microscopic Urine, Clean Catch     Status: Abnormal   Collection Time: 06/28/20 11:20 AM  Result Value Ref Range   Color, Urine YELLOW YELLOW   APPearance HAZY (A) CLEAR   Specific Gravity, Urine 1.019 1.005 - 1.030   pH 5.0 5.0 - 8.0   Glucose, UA NEGATIVE NEGATIVE mg/dL   Hgb urine dipstick SMALL (A) NEGATIVE   Bilirubin Urine NEGATIVE  NEGATIVE   Ketones, ur  5 (A) NEGATIVE mg/dL   Protein, ur NEGATIVE NEGATIVE mg/dL   Nitrite NEGATIVE NEGATIVE   Leukocytes,Ua NEGATIVE NEGATIVE   RBC / HPF 0-5 0 - 5 RBC/hpf   WBC, UA 0-5 0 - 5 WBC/hpf   Bacteria, UA NONE SEEN NONE SEEN   Squamous Epithelial / LPF 0-5 0 - 5   Mucus PRESENT    US OB LESS THAN 14 WEEKS WITH OB TRANSVAGINAL  Result Date: 06/28/2020 CLINICAL DATA:  Vaginal bleeding EXAM: OBSTETRIC <14 WK [place] AND TRANSVAGINAL OB US TECHNIQUE: Both transabdominal and transvaginal ultrasound examinations were performed for complete evaluation of the gestation as well as the maternal uterus, adnexal regions, and pelvic cul-de-sac. Transvaginal technique was performed to assess early pregnancy. COMPARISON:  None. FINDINGS: Intrauterine gestational sac: Visualized-single Yolk sac:  Not visualized Embryo:  Not visualized Cardiac Activity: Not visualized MSD: 9 mm;  5w, 4d Subchorionic hemorrhage:  None visualized. Maternal uterus/adnexae: Cervical os is closed. Right ovary measures 3.3 x 2.2 x 2.5 cm. Left ovary measures 2.6 x 1.9 x 2.3 cm. No extrauterine pelvic mass. No free pelvic fluid. IMPRESSION: Probable early intrauterine gestational sac seen in the fundus, but no yolk sac, fetal pole, or cardiac activity yet visualized. Recommend follow-up quantitative B-HCG levels and follow-up US in 14 days to assess viability. This recommendation follows SRU consensus guidelines: Diagnostic Criteria for Nonviable Pregnancy Early in the First Trimester. [employer] Med 2013; 299:3716-96. Based on gestational sac size, estimated gestational age is 5+ weeks. No subchorionic hemorrhage. No extrauterine pelvic mass or free pelvic fluid. Electronically Signed   By: Lowella Grip III M.D.   On: 06/28/2020 12:55    MAU Course  Procedures  MDM -r/o ectopic -UA: hazy/sm hgb/5ketones -CBC: WBCs 1.8, platelets 99 -CMP: WNL -[place]: single IUP, no yolk sac/embryo, [redacted]w[redacted]d MSD, no free fluid -hCG: 7,235 -ABO: O  Positive -WetPrep: WNL -GC/CT collected Discussed with client the diagnosis of pregnancy of unknown anatomic location.  Three possibilities of outcome are: a healthy pregnancy that is too early to see a yolk sac to confirm the pregnancy is in the uterus, a pregnancy that is not healthy and has not developed and will not develop, and an ectopic pregnancy that is in the abdomen that cannot be identified at this time.  And ectopic pregnancy can be a life threatening situation as a pregnancy needs to be in the uterus which is a muscle and can stretch to accommodate the growth of a pregnancy.  Other structures in the pelvis and abdomen as not muscular and do not stretch with the growth of a pregnancy.  Worst case scenario is that a structure ruptures with a growing pregnancy not in the uterus and and internal hemorrhage can be a life threatening situation.  We need to follow the progression of this pregnancy carefully.  We need to check another serum pregnancy hormone level to determine if the levels are rising appropriately  and to determine the next steps that are needed for you. Patient's questions were answered. -consulted with Dr. Elgie Congo who recommends repeat hCG in 48 hours -consulted with hospitalist Dr. Reesa Chew regarding abnormal CBC, who states that lab values are irregular because of COVID most likely, and if patient does not require oxygen, no further work-up is indicated at this time, but should have CBC repeated in the next week to ensure recovery of WBCs and platelets -COVID swab collected at hospital for documentation -pt discharged to home in stable condition  Orders Placed This Encounter  Procedures  . Wet prep, genital    Standing Status:   Standing    Number of Occurrences:   1  . Resp Panel by RT-PCR (Flu A&B, Covid) Nasopharyngeal Swab    Standing Status:   Standing    Number of Occurrences:   1    Order Specific Question:   Is this test for diagnosis or screening    Answer:   Diagnosis  of ill patient    Order Specific Question:   Symptomatic for COVID-19 as defined by CDC    Answer:   Yes    Order Specific Question:   Date of Symptom Onset    Answer:   06/24/2020    Order Specific Question:   Hospitalized for COVID-19    Answer:   Yes    Order Specific Question:   Admitted to ICU for COVID-19    Answer:   No    Order Specific Question:   Previously tested for COVID-19    Answer:   Yes    Order Specific Question:   Resident in a congregate (group) care setting    Answer:   No    Order Specific Question:   Employed in healthcare setting    Answer:   No    Order Specific Question:   Pregnant    Answer:   Yes    Order Specific Question:   Has patient completed COVID vaccination(s) (2 doses of Pfizer/Moderna 1 dose of [employer])    Answer:   No  . US OB LESS THAN 14 WEEKS WITH OB TRANSVAGINAL    Standing Status:   Standing    Number of Occurrences:   1    Order Specific Question:   Symptom/Reason for Exam    Answer:   Vaginal bleeding in pregnancy [[id number]]  . CBC    Standing Status:   Standing    Number of Occurrences:   1  . Comprehensive metabolic panel    Standing Status:   Standing    Number of Occurrences:   1  . hCG, quantitative, pregnancy    Standing Status:   Standing    Number of Occurrences:   1  . Urinalysis, Routine w reflex microscopic Urine, Clean Catch    Standing Status:   Standing    Number of Occurrences:   1  . Airborne and Contact precautions    Standing Status:   Standing    Number of Occurrences:   1  . Discharge patient    Order Specific Question:   Discharge disposition    Answer:   01-Home or Self Care [1]    Order Specific Question:   Discharge patient date    Answer:   06/28/2020   No orders of the defined types were placed in this encounter.  Assessment and Plan   1. Pregnancy of unknown anatomic location   2. Vaginal bleeding in pregnancy   3. Blood type, Rh positive   4. Lab test positive for detection of COVID-19  virus     Allergies as of 06/28/2020      Reactions   Penicillins Anaphylaxis      Medication List    TAKE these medications   acetaminophen 325 MG tablet Commonly known as: Tylenol Take 2 tablets (650 mg total) by mouth every 4 (four) hours as needed (for pain scale < 4).   sertraline 25 MG tablet Commonly known as: Zoloft Take 2 tablets (50 mg total) by mouth daily.       -  will call with culture results, if positive -safe meds in pregnancy list given -list of OB providers given -discussed ectopic vs. SAB vs. miscarriage -strict ectopic precautions given -return MAU precautions -f/u on MAU at 1045AM on 06/30/2020 for repeat hCG and CBC -pt discharged to home in stable condition  Megan Rocha 06/28/2020, 2:24 PM

## 2020-06-28 NOTE — Discharge Instructions (Signed)
Safe Medications in Pregnancy    Acne: Benzoyl Peroxide Salicylic Acid  Backache/Headache: Tylenol: 2 regular strength every 4 hours OR              2 Extra strength every 6 hours  Colds/Coughs/Allergies: Benadryl (alcohol free) 25 mg every 6 hours as needed Breath right strips Claritin Cepacol throat lozenges Chloraseptic throat spray Cold-Eeze- up to three times per day Cough drops, alcohol free Flonase (by prescription only) Guaifenesin Mucinex Robitussin DM (plain only, alcohol free) Saline nasal spray/drops Sudafed (pseudoephedrine) & Actifed ** use only after [redacted] weeks gestation and if you do not have high blood pressure Tylenol Vicks Vaporub Zinc lozenges Zyrtec   Constipation: Colace Ducolax suppositories Fleet enema Glycerin suppositories Metamucil Milk of magnesia Miralax Senokot Smooth move tea  Diarrhea: Kaopectate Imodium A-D  *NO pepto Bismol  Hemorrhoids: Anusol Anusol HC Preparation H Tucks  Indigestion: Tums Maalox Mylanta Zantac  Pepcid  Insomnia: Benadryl (alcohol free) 25mg  every 6 hours as needed Tylenol PM Unisom, no Gelcaps  Leg Cramps: Tums MagGel  Nausea/Vomiting:  Bonine Dramamine Emetrol Ginger extract Sea bands Meclizine  Nausea medication to take during pregnancy:  Unisom (doxylamine succinate 25 mg tablets) Take one tablet daily at bedtime. If symptoms are not adequately controlled, the dose can be increased to a maximum recommended dose of two tablets daily (1/2 tablet in the morning, 1/2 tablet mid-afternoon and one at bedtime). Vitamin B6 100mg  tablets. Take one tablet twice a day (up to 200 mg per day).  Skin Rashes: Aveeno products Benadryl cream or 25mg  every 6 hours as needed Calamine Lotion 1% cortisone cream  Yeast infection: Gyne-lotrimin 7 Monistat 7   **If taking multiple medications, please check labels to avoid duplicating the same active ingredients **take  medication as directed on the label ** Do not exceed 4000 mg of tylenol in 24 hours **Do not take medications that contain aspirin or ibuprofen            COVID-19 COVID-19 is a respiratory infection that is caused by a virus called severe acute respiratory syndrome coronavirus 2 (SARS-CoV-2). The disease is also known as coronavirus disease or novel coronavirus. In some people, the virus may not cause any symptoms. In others, it may cause a serious infection. The infection can get worse quickly and can lead to complications, such as:  Pneumonia, or infection of the lungs.  Acute respiratory distress syndrome or ARDS. This is a condition in which fluid build-up in the lungs prevents the lungs from filling with air and passing oxygen into the blood.  Acute respiratory failure. This is a condition in which there is not enough oxygen passing from the lungs to the body or when carbon dioxide is not passing from the lungs out of the body.  Sepsis or septic shock. This is a serious bodily reaction to an infection.  Blood clotting problems.  Secondary infections due to bacteria or fungus.  Organ failure. This is when your body's organs stop working. The virus that causes COVID-19 is contagious. This means that it can spread from person to person through droplets from coughs and sneezes (respiratory secretions). What are the causes? This illness is caused by a virus. You may catch the virus by:  Breathing in droplets from an infected person. Droplets can be spread by a person breathing, speaking, singing, coughing, or sneezing.  Touching something, like a table or a doorknob, that was exposed to the virus (contaminated) and then touching your mouth, nose, or eyes.  What increases the risk? Risk for infection You are more likely to be infected with this virus if you:  Are within 6 feet (2 meters) of a person with COVID-19.  Provide care for or live with a person who is infected with  COVID-19.  Spend time in crowded indoor spaces or live in shared housing. Risk for serious illness You are more likely to become seriously ill from the virus if you:  Are 53 years of age or older. The higher your age, the more you are at risk for serious illness.  Live in a nursing home or long-term care facility.  Have cancer.  Have a long-term (chronic) disease such as: ? Chronic lung disease, including chronic obstructive pulmonary disease or asthma. ? A long-term disease that lowers your body's ability to fight infection (immunocompromised). ? Heart disease, including heart failure, a condition in which the arteries that lead to the heart become narrow or blocked (coronary artery disease), a disease which makes the heart muscle thick, weak, or stiff (cardiomyopathy). ? Diabetes. ? Chronic kidney disease. ? Sickle cell disease, a condition in which red blood cells have an abnormal "sickle" shape. ? Liver disease.  Are obese. What are the signs or symptoms? Symptoms of this condition can range from mild to severe. Symptoms may appear any time from 2 to 14 days after being exposed to the virus. They include:  A fever or chills.  A cough.  Difficulty breathing.  Headaches, body aches, or muscle aches.  Runny or stuffy (congested) nose.  A sore throat.  New loss of taste or smell. Some people may also have stomach problems, such as nausea, vomiting, or diarrhea. Other people may not have any symptoms of COVID-19. How is this diagnosed? This condition may be diagnosed based on:  Your signs and symptoms, especially if: ? You live in an area with a COVID-19 outbreak. ? You recently traveled to or from an area where the virus is common. ? You provide care for or live with a person who was diagnosed with COVID-19. ? You were exposed to a person who was diagnosed with COVID-19.  A physical exam.  Lab tests, which may include: ? Taking a sample of fluid from the back of  your nose and throat (nasopharyngeal fluid), your nose, or your throat using a swab. ? A sample of mucus from your lungs (sputum). ? Blood tests.  Imaging tests, which may include, X-rays, CT scan, or ultrasound. How is this treated? At present, there is no medicine to treat COVID-19. Medicines that treat other diseases are being used on a trial basis to see if they are effective against COVID-19. Your health care provider will talk with you about ways to treat your symptoms. For most people, the infection is mild and can be managed at home with rest, fluids, and over-the-counter medicines. Treatment for a serious infection usually takes places in a hospital intensive care unit (ICU). It may include one or more of the following treatments. These treatments are given until your symptoms improve.  Receiving fluids and medicines through an IV.  Supplemental oxygen. Extra oxygen is given through a tube in the nose, a face mask, or a hood.  Positioning you to lie on your stomach (prone position). This makes it easier for oxygen to get into the lungs.  Continuous positive airway pressure (CPAP) or bi-level positive airway pressure (BPAP) machine. This treatment uses mild air pressure to keep the airways open. A tube that is connected to  a motor delivers oxygen to the body.  Ventilator. This treatment moves air into and out of the lungs by using a tube that is placed in your windpipe.  Tracheostomy. This is a procedure to create a hole in the neck so that a breathing tube can be inserted.  Extracorporeal membrane oxygenation (ECMO). This procedure gives the lungs a chance to recover by taking over the functions of the heart and lungs. It supplies oxygen to the body and removes carbon dioxide. Follow these instructions at home: Lifestyle  If you are sick, stay home except to get medical care. Your health care provider will tell you how long to stay home. Call your health care provider before you go  for medical care.  Rest at home as told by your health care provider.  Do not use any products that contain nicotine or tobacco, such as cigarettes, e-cigarettes, and chewing tobacco. If you need help quitting, ask your health care provider.  Return to your normal activities as told by your health care provider. Ask your health care provider what activities are safe for you. General instructions  Take over-the-counter and prescription medicines only as told by your health care provider.  Drink enough fluid to keep your urine pale yellow.  Keep all follow-up visits as told by your health care provider. This is important. How is this prevented?  There is no vaccine to help prevent COVID-19 infection. However, there are steps you can take to protect yourself and others from this virus. To protect yourself:   Do not travel to areas where COVID-19 is a risk. The areas where COVID-19 is reported change often. To identify high-risk areas and travel restrictions, check the CDC travel website: StageSync.si  If you live in, or must travel to, an area where COVID-19 is a risk, take precautions to avoid infection. ? Stay away from people who are sick. ? Wash your hands often with soap and water for 20 seconds. If soap and water are not available, use an alcohol-based hand sanitizer. ? Avoid touching your mouth, face, eyes, or nose. ? Avoid going out in public, follow guidance from your state and local health authorities. ? If you must go out in public, wear a cloth face covering or face mask. Make sure your mask covers your nose and mouth. ? Avoid crowded indoor spaces. Stay at least 6 feet (2 meters) away from others. ? Disinfect objects and surfaces that are frequently touched every day. This may include:  Counters and tables.  Doorknobs and light switches.  Sinks and faucets.  Electronics, such as phones, remote controls, keyboards, computers, and tablets. To protect  others: If you have symptoms of COVID-19, take steps to prevent the virus from spreading to others.  If you think you have a COVID-19 infection, contact your health care provider right away. Tell your health care team that you think you may have a COVID-19 infection.  Stay home. Leave your house only to seek medical care. Do not use public transport.  Do not travel while you are sick.  Wash your hands often with soap and water for 20 seconds. If soap and water are not available, use alcohol-based hand sanitizer.  Stay away from other members of your household. Let healthy household members care for children and pets, if possible. If you have to care for children or pets, wash your hands often and wear a mask. If possible, stay in your own room, separate from others. Use a different bathroom.  Make  sure that all people in your household wash their hands well and often.  Cough or sneeze into a tissue or your sleeve or elbow. Do not cough or sneeze into your hand or into the air.  Wear a cloth face covering or face mask. Make sure your mask covers your nose and mouth. Where to find more information  Centers for Disease Control and Prevention: StickerEmporium.tn  World Health Organization: https://thompson-craig.com/ Contact a health care provider if:  You live in or have traveled to an area where COVID-19 is a risk and you have symptoms of the infection.  You have had contact with someone who has COVID-19 and you have symptoms of the infection. Get help right away if:  You have trouble breathing.  You have pain or pressure in your chest.  You have confusion.  You have bluish lips and fingernails.  You have difficulty waking from sleep.  You have symptoms that get worse. These symptoms may represent a serious problem that is an emergency. Do not wait to see if the symptoms will go away. Get medical help right away. Call your local emergency  services (911 in the [place] Do not drive yourself to the hospital. Let the emergency medical personnel know if you think you have COVID-19. Summary  COVID-19 is a respiratory infection that is caused by a virus. It is also known as coronavirus disease or novel coronavirus. It can cause serious infections, such as pneumonia, acute respiratory distress syndrome, acute respiratory failure, or sepsis.  The virus that causes COVID-19 is contagious. This means that it can spread from person to person through droplets from breathing, speaking, singing, coughing, or sneezing.  You are more likely to develop a serious illness if you are 44 years of age or older, have a weak immune system, live in a nursing home, or have chronic disease.  There is no medicine to treat COVID-19. Your health care provider will talk with you about ways to treat your symptoms.  Take steps to protect yourself and others from infection. Wash your hands often and disinfect objects and surfaces that are frequently touched every day. Stay away from people who are sick and wear a mask if you are sick. This information is not intended to replace advice given to you by your health care provider. Make sure you discuss any questions you have with your health care provider. Document Revised: 04/08/2019 Document Reviewed: 07/15/2018 Elsevier Patient Education  2020 Elsevier Inc.        10 Things You Can Do to Manage Your COVID-19 Symptoms at Home If you have possible or confirmed COVID-19: 1. Stay home from work and school. And stay away from other public places. If you must go out, avoid using any kind of public transportation, ridesharing, or taxis. 2. Monitor your symptoms carefully. If your symptoms get worse, call your healthcare provider immediately. 3. Get rest and stay hydrated. 4. If you have a medical appointment, call the healthcare provider ahead of time and tell them that you have or may have COVID-19. 5. For medical  emergencies, call 911 and notify the dispatch personnel that you have or may have COVID-19. 6. Cover your cough and sneezes with a tissue or use the inside of your elbow. 7. Wash your hands often with soap and water for at least 20 seconds or clean your hands with an alcohol-based hand sanitizer that contains at least 60% alcohol. 8. As much as possible, stay in a specific room and away from other  people in your home. Also, you should use a separate bathroom, if available. If you need to be around other people in or outside of the home, wear a mask. 9. Avoid sharing personal items with other people in your household, like dishes, towels, and bedding. 10. Clean all surfaces that are touched often, like counters, tabletops, and doorknobs. Use household cleaning sprays or wipes according to the label instructions. SouthAmericaFlowers.co.ukcdc.gov/coronavirus 12/22/2018 This information is not intended to replace advice given to you by your health care provider. Make sure you discuss any questions you have with your health care provider. Document Revised: 05/26/2019 Document Reviewed: 05/26/2019 Elsevier Patient Education  2020 [employer]         COVID-19 Frequently Asked Questions COVID-19 (coronavirus disease) is an infection that is caused by a large family of viruses. Some viruses cause illness in people and others cause illness in animals like camels, cats, and bats. In some cases, the viruses that cause illness in animals can spread to humans. Where did the coronavirus come from? In December 2019, Armeniahina told the [employer] [facility](WHO) of several cases of lung disease (human respiratory illness). These cases were linked to an open seafood and livestock market [place] the [place]. The link to the seafood and livestock market suggests that the virus may have spread from animals to humans. However, since that first outbreak in December, the virus has also been shown to spread from person to person. What is  the name of the disease and the virus? Disease name Early on, this disease was called novel coronavirus. This is because scientists determined that the disease was caused by a new (novel) respiratory virus. The World Health Organization [facility](WHO) has now named the disease COVID-19, or coronavirus disease. Virus name The virus that causes the disease is called severe acute respiratory syndrome coronavirus 2 (SARS-CoV-2). More information on disease and virus naming World Health Organization [facility](WHO): www.who.int/emergencies/diseases/novel-coronavirus-2019/technical-guidance/naming-the-coronavirus-disease-(covid-2019)-and-the-virus-that-causes-it Who is at risk for complications from coronavirus disease? Some people may be at higher risk for complications from coronavirus disease. This includes older adults and people who have chronic diseases, such as heart disease, diabetes, and lung disease. If you are at higher risk for complications, take these extra precautions:  Stay home as much as possible.  Avoid social gatherings and travel.  Avoid close contact with others. Stay at least 6 ft (2 m) away from others, if possible.  Wash your hands often with soap and water for at least 20 seconds.  Avoid touching your face, mouth, nose, or eyes.  Keep supplies on hand at home, such as food, medicine, and cleaning supplies.  If you must go out in public, wear a cloth face covering or face mask. Make sure your mask covers your nose and mouth. How does coronavirus disease spread? The virus that causes coronavirus disease spreads easily from person to person (is contagious). You may catch the virus by:  Breathing in droplets from an infected person. Droplets can be spread by a person breathing, speaking, singing, coughing, or sneezing.  Touching something, like a table or a doorknob, that was exposed to the virus (contaminated) and then touching your mouth, nose, or eyes. Can I get the virus from touching  surfaces or objects? There is still a lot that we do not know about the virus that causes coronavirus disease. Scientists are basing a lot of information on what they know about similar viruses, such as:  Viruses cannot generally survive on surfaces for long. They need  a human body (host) to survive.  It is more likely that the virus is spread by close contact with people who are sick (direct contact), such as through: ? Shaking hands or hugging. ? Breathing in respiratory droplets that travel through the air. Droplets can be spread by a person breathing, speaking, singing, coughing, or sneezing.  It is less likely that the virus is spread when a person touches a surface or object that has the virus on it (indirect contact). The virus may be able to enter the body if the person touches a surface or object and then touches his or her face, eyes, nose, or mouth. Can a person spread the virus without having symptoms of the disease? It may be possible for the virus to spread before a person has symptoms of the disease, but this is most likely not the main way the virus is spreading. It is more likely for the virus to spread by being in close contact with people who are sick and breathing in the respiratory droplets spread by a person breathing, speaking, singing, coughing, or sneezing. What are the symptoms of coronavirus disease? Symptoms vary from person to person and can range from mild to severe. Symptoms may include:  Fever or chills.  Cough.  Difficulty breathing or feeling short of breath.  Headaches, body aches, or muscle aches.  Runny or stuffy (congested) nose.  Sore throat.  New loss of taste or smell.  Nausea, vomiting, or diarrhea. These symptoms can appear anywhere from 2 to 14 days after you have been exposed to the virus. Some people may not have any symptoms. If you develop symptoms, call your health care provider. People with severe symptoms may need hospital care. Should  I be tested for this virus? Your health care provider will decide whether to test you based on your symptoms, history of exposure, and your risk factors. How does a health care provider test for this virus? Health care providers will collect samples to send for testing. Samples may include:  Taking a swab of fluid from the back of your nose and throat, your nose, or your throat.  Taking fluid from the lungs by having you cough up mucus (sputum) into a sterile cup.  Taking a blood sample. Is there a treatment or vaccine for this virus? Currently, there is no vaccine to prevent coronavirus disease. Also, there are no medicines like antibiotics or antivirals to treat the virus. A person who becomes sick is given supportive care, which means rest and fluids. A person may also relieve his or her symptoms by using over-the-counter medicines that treat sneezing, coughing, and runny nose. These are the same medicines that a person takes for the common cold. If you develop symptoms, call your health care provider. People with severe symptoms may need hospital care. What can I do to protect myself and my family from this virus?     You can protect yourself and your family by taking the same actions that you would take to prevent the spread of other viruses. Take the following actions:  Wash your hands often with soap and water for at least 20 seconds. If soap and water are not available, use alcohol-based hand sanitizer.  Avoid touching your face, mouth, nose, or eyes.  Cough or sneeze into a tissue, sleeve, or elbow. Do not cough or sneeze into your hand or the air. ? If you cough or sneeze into a tissue, throw it away immediately and wash your hands.  Disinfect objects and surfaces that you frequently touch every day.  Stay away from people who are sick.  Avoid going out in public, follow guidance from your state and local health authorities.  Avoid crowded indoor spaces. Stay at least 6 ft  (2 m) away from others.  If you must go out in public, wear a cloth face covering or face mask. Make sure your mask covers your nose and mouth.  Stay home if you are sick, except to get medical care. Call your health care provider before you get medical care. Your health care provider will tell you how long to stay home.  Make sure your vaccines are up to date. Ask your health care provider what vaccines you need. What should I do if I need to travel? Follow travel recommendations from your local health authority, the CDC, and WHO. Travel information and advice  Centers for Disease Control and Prevention (CDC): GeminiCard.gl  World Health Organization [facility]): PreviewDomains.se Know the risks and take action to protect your health  You are at higher risk of getting coronavirus disease if you are traveling to areas with an outbreak or if you are exposed to travelers from areas with an outbreak.  Wash your hands often and practice good hygiene to lower the risk of catching or spreading the virus. What should I do if I am sick? General instructions to stop the spread of infection  Wash your hands often with soap and water for at least 20 seconds. If soap and water are not available, use alcohol-based hand sanitizer.  Cough or sneeze into a tissue, sleeve, or elbow. Do not cough or sneeze into your hand or the air.  If you cough or sneeze into a tissue, throw it away immediately and wash your hands.  Stay home unless you must get medical care. Call your health care provider or local health authority before you get medical care.  Avoid public areas. Do not take public transportation, if possible.  If you can, wear a mask if you must go out of the house or if you are in close contact with someone who is not sick. Make sure your mask covers your nose and mouth. Keep your home clean  Disinfect  objects and surfaces that are frequently touched every day. This may include: ? Counters and tables. ? Doorknobs and light switches. ? Sinks and faucets. ? Electronics such as phones, remote controls, keyboards, computers, and tablets.  Wash dishes in hot, soapy water or use a dishwasher. Air-dry your dishes.  Wash laundry in hot water. Prevent infecting other household members  Let healthy household members care for children and pets, if possible. If you have to care for children or pets, wash your hands often and wear a mask.  Sleep in a different bedroom or bed, if possible.  Do not share personal items, such as razors, toothbrushes, deodorant, combs, brushes, towels, and washcloths. Where to find more information Centers for Disease Control and Prevention (CDC)  Information and news updates: CardRetirement.cz World Health Organization [facility])  Information and news updates: AffordableSalon.es  Coronavirus health topic: https://thompson-craig.com/  Questions and answers on COVID-19: [employer]iseway.com  Global tracker: who.sprinklr.com American Academy of Pediatrics (AAP)  Information for families: www.healthychildren.org/English/health-issues/conditions/chest-lungs/Pages/2019-Novel-Coronavirus.aspx The coronavirus situation is changing rapidly. Check your local health authority website or the CDC and [facility] websites for updates and news. When should I contact a health care provider?  Contact your health care provider if you have symptoms of an infection, such as fever or  cough, and you: ? Have been near anyone who is known to have coronavirus disease. ? Have come into contact with a person who is suspected to have coronavirus disease. ? Have traveled to an area where there is an outbreak of COVID-19. When should I get emergency medical care?  Get help right away by calling your local  emergency services (911 in the [place] if you have: ? Trouble breathing. ? Pain or pressure in your chest. ? Confusion. ? Blue-tinged lips and fingernails. ? Difficulty waking from sleep. ? Symptoms that get worse. Let the emergency medical personnel know if you think you have coronavirus disease. Summary  A new respiratory virus is spreading from person to person and causing COVID-19 (coronavirus disease).  The virus that causes COVID-19 appears to spread easily. It spreads from one person to another through droplets from breathing, speaking, singing, coughing, or sneezing.  Older adults and those with chronic diseases are at higher risk of disease. If you are at higher risk for complications, take extra precautions.  There is currently no vaccine to prevent coronavirus disease. There are no medicines, such as antibiotics or antivirals, to treat the virus.  You can protect yourself and your family by washing your hands often, avoiding touching your face, and covering your coughs and sneezes. This information is not intended to replace advice given to you by your health care provider. Make sure you discuss any questions you have with your health care provider. Document Revised: 04/08/2019 Document Reviewed: 10/05/2018 Elsevier Patient Education  2020 Elsevier Inc.        Pregnancy and COVID-19 Coronavirus disease, also called COVID-19, is an infection of the lungs and airways (respiratory tract). It is unclear at this time if pregnancy makes it more likely for you to get COVID-19, or what effects the infection may have on your unborn baby. However, pregnancy causes changes to your heart, lungs, and your body's disease-fighting system (immune system). Some of these changes make it more likely for you to get sick and have more serious illness. Therefore, it is important for you to take precautions in order to protect yourself and your unborn baby. There have been studies showing that  obesity and diabetes may put you at higher risk for serious illness. If you are pregnant and are obese or have diabetes, you should take extra precautions to protect yourself from the virus. Work with your health care team to develop a plan to protect yourself from all infections, including COVID-19. This is one way for you to stay healthy during your pregnancy and to keep your baby healthy as well. How does this affect me? If you get COVID-19, there is a risk that you may:  Get a respiratory illness that can lead to pneumonia.  Give birth to your baby before 37 weeks of pregnancy (premature birth). If you have or may have COVID-19, your health care provider may recommend special precautions around your pregnancy. This may affect how you:  Receive care before delivery (prenatal care). How you visit your health care provider may change. Tests and scans may need to be performed differently.  Receive care during labor and delivery. This may affect your birth plan, including who may be with you during labor and delivery.  Receive care after you deliver your baby (postpartum care). You may stay longer in the hospital and in a special room.  Feed your baby after he or she is born. Pregnancy can be an especially stressful time because of the changes  in your body and the preparation involved in becoming a parent. In addition, you may be feeling especially fearful, anxious, or stressed because of COVID-19 and how it is affecting you. How does this affect my baby? It is not known whether a mother will transmit the virus to her unborn baby. There is a risk that if you get COVID-19:  The virus that causes COVID-19 can pass to your baby.  You may have premature birth. Your baby may require more medical care if this happens. What can I do to lower my risk?  There is no vaccine to help prevent COVID-19. However, there are actions that you can take to protect yourself and others from this virus. Cleaning  and personal hygiene  Wash your hands often with soap and water for at least 20 seconds. If soap and water are not available, use alcohol-based hand sanitizer.  Avoid touching your mouth, face, eyes, or nose.  Clean and disinfect objects and surfaces that are frequently touched every day. These may include: ? Counters and tables. ? Doorknobs and light switches. ? Sinks and faucets. ? Electronics such as phones, remote controls, keyboards, computers, and tablets. Stay away from others  Stay away from people who are sick, if possible.  Avoid social gatherings and travel.  Stay home as much as possible. Follow these instructions: Breastfeeding It is not known if the virus that causes COVID-19 can pass through breast milk to your baby. You should make a plan for feeding your infant with your family and your health care team. If you have or may have COVID-19, your health care provider may recommend that you take precautions while breastfeeding, such as:  Washing your hands before feeding your baby.  Wearing a mask while feeding your baby.  Pumping or expressing breast milk to feed to your baby. If possible, ask someone in your household who is not sick to feed your baby the expressed breast milk. ? Wash your hands before touching pump parts. ? Wash and disinfect all pump parts after expressing milk. Follow the manufacturer's instructions to clean and disinfect all pump parts. General instructions  If you think you have a COVID-19 infection, contact your health care provider right away. Tell your health care provider that you think you may have a COVID-19 infection.  Follow your health care provider's instructions on taking medicines. Some medicines may be unsafe to take during pregnancy.  Cover your mouth and nose by wearing a mask or other cloth covering over your face when you go out in public.  Find ways to manage stress. These may include: ? Using relaxation techniques like  meditation and deep breathing. ? Getting regular exercise. Most women can continue their usual exercise routine during pregnancy. Ask your health care provider what activities are safe for you. ? Seeking support from family, friends, or spiritual resources. If you cannot be together in person, you can still connect by phone calls, texts, video calls, or online messaging. ? Spending time doing relaxing activities that you enjoy, like listening to music or reading a good book.  Ask for help if you have counseling or nutritional needs during pregnancy. Your health care provider can offer advice or refer you to resources or specialists who can help you with various needs.  Keep all follow-up visits as told by your health care provider. This is important. Where to find more information Centers for Disease Control and Prevention (CDC): AffordableShare.com.br World Health Organization [facility]): PokerPortraits.es [employer] (  ACOG): BuyDucts.dk Questions to ask your health care team  What should I do if I have COVID-19 symptoms?  How will COVID-19 affect my prenatal care visits, tests and scans, labor and delivery, and postpartum care?  Should I plan to breastfeed my baby?  Where can I find mental health resources?  Where can I find support if I have financial concerns? Contact a health care provider if:  You have signs and symptoms of infection, including a fever or cough. Tell your health care team that you think you may have a COVID-19 infection.  You have strong emotions, such as sadness or anxiety.  You feel unsafe in your home and need help finding a safe place to live.  You have bloody or watery vaginal discharge or vaginal bleeding. Get help right away if:  You have signs or symptoms of labor  before 37 weeks of pregnancy. These include: ? Contractions that are 5 minutes or less apart, or that increase in frequency, intensity, or length. ? Sudden, sharp pain in the abdomen or in the lower back. ? A gush or trickle of fluid from your vagina.  You have signs of more serious illness such as: ? You have difficulty breathing. ? You have chest pain. ? You have a fever greater than 102F (39C) or higher that does not go away. ? You cannot drink fluids without vomiting. ? You feel extremely weak or you faint. These symptoms may represent a serious problem that is an emergency. Do not wait to see if the symptoms will go away. Get medical help right away. Call your local emergency services (911 in the [place] Do not drive yourself to the hospital. Summary  Coronavirus disease, also called COVID-19, is an infection of the lungs and airways (respiratory tract). It is unclear at this time if pregnancy makes you more susceptible to COVID-19 and what effects it may have on unborn babies.  It is important to take precautions to protect yourself and your developing baby. This includes washing your hands often, avoiding touching your mouth, face, eyes, or nose, avoiding social gatherings and travel, and staying away from people who are sick.  If you think you have a COVID-19 infection, contact your health care provider right away. Tell your health care provider that you think you may have a COVID-19 infection.  If you have or may have COVID-19, your health care provider may recommend special precautions during your pregnancy, labor and delivery, and after your baby is born. This information is not intended to replace advice given to you by your health care provider. Make sure you discuss any questions you have with your health care provider. Document Revised: 04/01/2019 Document Reviewed: 10/05/2018 Elsevier Patient Education  2020 Elsevier Inc.        Ectopic Pregnancy  An ectopic  pregnancy is when the fertilized egg attaches (implants) outside the uterus. Most ectopic pregnancies occur in one of the tubes where eggs travel from the ovary to the uterus (fallopian tubes), but the implanting can occur in other locations. In rare cases, ectopic pregnancies occur on the ovary, intestine, pelvis, abdomen, or cervix. In an ectopic pregnancy, the fertilized egg does not have the ability to develop into a normal, healthy baby. A ruptured ectopic pregnancy is one in which tearing or bursting of a fallopian tube causes internal bleeding. Often, there is intense lower abdominal pain, and vaginal bleeding sometimes occurs. Having an ectopic pregnancy can be life-threatening. If this dangerous condition is not treated, it can  lead to blood loss, shock, or even death. What are the causes? The most common cause of this condition is damage to one of the fallopian tubes. A fallopian tube may be narrowed or blocked, and that keeps the fertilized egg from reaching the uterus. What increases the risk? This condition is more likely to develop in women of childbearing age who have different levels of risk. The levels of risk can be divided into three categories. High risk  You have gone through infertility treatment.  You have had an ectopic pregnancy before.  You have had surgery on the fallopian tubes, or another surgical procedure, such as an abortion.  You have had surgery to have the fallopian tubes tied (tubal ligation).  You have problems or diseases of the fallopian tubes.  You have been exposed to diethylstilbestrol (DES). This medicine was used until 1971, and it had effects on babies whose mothers took the medicine.  You become pregnant while using an IUD (intrauterine device) for birth control. Moderate risk  You have a history of infertility.  You have had an STI (sexually transmitted infection).  You have a history of pelvic inflammatory disease (PID).  You have scarring  from endometriosis.  You have multiple sexual partners.  You smoke. Low risk  You have had pelvic surgery.  You use vaginal douches.  You became sexually active before age 75. What are the signs or symptoms? Common symptoms of this condition include normal pregnancy symptoms, such as missing a period, nausea, tiredness, abdominal pain, breast tenderness, and bleeding. However, ectopic pregnancy will have additional symptoms, such as:  Pain with intercourse.  Irregular vaginal bleeding or spotting.  Cramping or pain on one side or in the lower abdomen.  Fast heartbeat, low blood pressure, and sweating.  Passing out while having a bowel movement. Symptoms of a ruptured ectopic pregnancy and internal bleeding may include:  Sudden, severe pain in the abdomen and pelvis.  Dizziness, weakness, light-headedness, or fainting.  Pain in the shoulder or neck area. How is this diagnosed? This condition is diagnosed by:  A pelvic exam to locate pain or a mass in the abdomen.  A pregnancy test. This blood test checks for the presence as well as the specific level of pregnancy hormone in the bloodstream.  Ultrasound. This is performed if a pregnancy test is positive. In this test, a probe is inserted into the vagina. The probe will detect a fetus, possibly in a location other than the uterus.  Taking a sample of uterus tissue (dilation and curettage, or D&C).  Surgery to perform a visual exam of the inside of the abdomen using a thin, lighted tube that has a tiny camera on the end (laparoscope).  Culdocentesis. This procedure involves inserting a needle at the top of the vagina, behind the uterus. If blood is present in this area, it may indicate that a fallopian tube is torn. How is this treated? This condition is treated with medicine or surgery. Medicine  An injection of a medicine (methotrexate) may be given to cause the pregnancy tissue to be absorbed. This medicine may save  your fallopian tube. It may be given if: ? The diagnosis is made early, with no signs of active bleeding. ? The fallopian tube has not ruptured. ? You are considered to be a good candidate for the medicine. Usually, pregnancy hormone blood levels are checked after methotrexate treatment. This is to be sure that the medicine is effective. It may take 4-6 weeks for the  pregnancy to be absorbed. Most pregnancies will be absorbed by 3 weeks. Surgery  A laparoscope may be used to remove the pregnancy tissue.  If severe internal bleeding occurs, a larger cut (incision) may be made in the lower abdomen (laparotomy) to remove the fetus and placenta. This is done to stop the bleeding.  Part or all of the fallopian tube may be removed (salpingectomy) along with the fetus and placenta. The fallopian tube may also be repaired during the surgery.  In very rare circumstances, removal of the uterus (hysterectomy) may be required.  After surgery, pregnancy hormone testing may be done to be sure that there is no pregnancy tissue left. Whether your treatment is medicine or surgery, you may receive a Rho (D) immune globulin shot to prevent problems with any future pregnancy. This shot may be given if:  You are Rh-negative and the baby's father is Rh-positive.  You are Rh-negative and you do not know the Rh type of the baby's father. Follow these instructions at home:  Rest and limit your activity after the procedure for as long as told by your health care provider.  Until your health care provider says that it is safe: ? Do not lift anything that is heavier than 10 lb (4.5 kg), or the limit that your health care provider tells you. ? Avoid physical exercise and any movement that requires effort (is strenuous).  To help prevent constipation: ? Eat a healthy diet that includes fruits, vegetables, and whole grains. ? Drink 6-8 glasses of water per day. Get help right away if:  You develop worsening pain  that is not relieved by medicine.  You have: ? A fever or chills. ? Vaginal bleeding. ? Redness and swelling at the incision site. ? Nausea and vomiting.  You feel dizzy or weak.  You feel light-headed or you faint. This information is not intended to replace advice given to you by your health care provider. Make sure you discuss any questions you have with your health care provider. Document Revised: 05/22/2017 Document Reviewed: 01/09/2016 Elsevier Patient Education  2020 Elsevier Inc.        Ruptured Ectopic Pregnancy  An ectopic pregnancy is when a fertilized egg attaches (implants) outside of the uterus, usually in a fallopian tube. A ruptured ectopic pregnancy is when the fallopian tube tears or bursts. This results in internal bleeding, intense abdominal pain, and sometimes, vaginal bleeding. Most ectopic pregnancies occur in the fallopian tube. In rare cases, it may occur on the ovary, intestine, pelvis, or cervix. An ectopic pregnancy does not have the ability to develop into a normal, healthy baby. A ruptured ectopic pregnancy can affect your ability to have children (fertility), depending on damage it causes to your reproductive organs. Ruptured ectopic pregnancy is a medical emergency. If not treated immediately, it can lead to blood loss, shock, or even death. What are the causes? Most ectopic pregnancies are caused by damage to the fallopian tubes. The damage prevents the fertilized egg from implanting in the uterus. In some cases, the cause may not be known. What increases the risk? You are at increased risk for an ectopic pregnancy if:  You have had a previous ectopic pregnancy.  You have had previous fallopian tube surgery.  You have had previous surgery to have the fallopian tubes tied (tubal ligation).  You have had infertility treatments or have a history of infertility.  You have been exposed to DES. DES is a medicine that was used until 1971 and  had  effects on babies whose mothers took the medicine.  You use an IUD (intrauterine device) for birth control.  You use progestin-only oral contraception for birth control.  You have a history of pelvic inflammatory disease (PID).  You have a history of endometriosis.  You smoke.  You became sexually active before 28 years of age.  You have multiple sexual partners. What are the signs or symptoms? Symptoms of a ruptured ectopic pregnancy and internal bleeding may include:  Sudden, severe pain in the abdomen and pelvis.  Dizziness or fainting.  Pain in the shoulder area.  Vaginal bleeding. How is this diagnosed? This condition is diagnosed based on your medical history, symptoms, a physical exam, and tests, which may include:  A pregnancy test.  An ultrasound.  Measuring the levels of the pregnancy hormone in the bloodstream.  Taking a sample of tissue from the uterus (dilation and curettage, D&C).  Surgery to visually examine the inside of the abdomen using a lighted tube (laparoscopy). How is this treated? This condition is treated with IV fluids and emergency surgery to remove the ectopic pregnancy and repair the area where the rupture occured. If you have lost a lot of blood, you may need a blood transfusion. If you are Rh negative and your baby's father is Rh positive, or the Rh type of the father is unknown, you may receive a Rho (D) immune globulin shot. This is to prevent Rh problems in future pregnancies. Additional medicines may be given. Get help right away if:  You are taking medicines to treat an ectopic pregnancy and you develop symptoms of a rupture. These include: ? Fever or chills. ? Shoulder pain. ? Vaginal bleeding. ? Nausea and vomiting. ? Severe abdominal pain or cramping. ? Feeling light-headed or fainting. Summary  An ectopic pregnancy is when a fertilized egg attaches (implants) outside of the uterus, usually in a fallopian tube. A ruptured  ectopic pregnancy is when the fallopian tube tears or bursts.  Ruptured ectopic pregnancy is a medical emergency. If not treated immediately, it can lead to blood loss, shock, or even death.  This condition is treated with IV fluids and emergency surgery to remove the ectopic pregnancy and repair the area where the rupture occured. If you have lost a lot of blood, you may need a blood transfusion. This information is not intended to replace advice given to you by your health care provider. Make sure you discuss any questions you have with your health care provider. Document Revised: 05/22/2017 Document Reviewed: 08/27/2016 Elsevier Patient Education  2020 [employer].        Miscarriage A miscarriage is the loss of an unborn baby (fetus) before the 20th week of pregnancy. Most miscarriages happen during the first 3 months of pregnancy. Sometimes, a miscarriage can happen before a woman knows that she is pregnant. Having a miscarriage can be an emotional experience. If you have had a miscarriage, talk with your health care provider about any questions you may have about miscarrying, the grieving process, and your plans for future pregnancy. What are the causes? A miscarriage may be caused by:  Problems with the genes or chromosomes of the fetus. These problems make it impossible for the baby to develop normally. They are often the result of random errors that occur early in the development of the baby, and are not passed from parent to child (not inherited).  Infection of the cervix or uterus.  Conditions that affect hormone balance in the body.  Problems with the cervix, such as the cervix opening and thinning before pregnancy is at term (cervical insufficiency).  Problems with the uterus. These may include: ? A uterus with an abnormal shape. ? Fibroids in the uterus. ? Congenital abnormalities. These are problems that were present at birth.  Certain medical  conditions.  Smoking, drinking alcohol, or using drugs.  Injury (trauma). In many cases, the cause of a miscarriage is not known. What are the signs or symptoms? Symptoms of this condition include:  Vaginal bleeding or spotting, with or without cramps or pain.  Pain or cramping in the abdomen or lower back.  Passing fluid, tissue, or blood clots from the vagina. How is this diagnosed? This condition may be diagnosed based on:  A physical exam.  Ultrasound.  Blood tests.  Urine tests. How is this treated? Treatment for a miscarriage is sometimes not necessary if you naturally pass all the tissue that was in your uterus. If necessary, this condition may be treated with:  Dilation and curettage (D&C). This is a procedure in which the cervix is stretched open and the lining of the uterus (endometrium) is scraped. This is done only if tissue from the fetus or placenta remains in the body (incomplete miscarriage).  Medicines, such as: ? Antibiotic medicine, to treat infection. ? Medicine to help the body pass any remaining tissue. ? Medicine to reduce (contract) the size of the uterus. These medicines may be given if you have a lot of bleeding. If you have Rh negative blood and your baby was Rh positive, you will need a shot of a medicine called Rh immunoglobulinto protect your future babies from Rh blood problems. "Rh-negative" and "Rh-positive" refer to whether or not the blood has a specific protein found on the surface of red blood cells (Rh factor). Follow these instructions at home: Medicines   Take over-the-counter and prescription medicines only as told by your health care provider.  If you were prescribed antibiotic medicine, take it as told by your health care provider. Do not stop taking the antibiotic even if you start to feel better.  Do not take NSAIDs, such as aspirin and ibuprofen, unless they are approved by your health care provider. These medicines can cause  bleeding. Activity  Rest as directed. Ask your health care provider what activities are safe for you.  Have someone help with home and family responsibilities during this time. General instructions  Keep track of the number of sanitary pads you use each day and how soaked (saturated) they are. Write down this information.  Monitor the amount of tissue or blood clots that you pass from your vagina. Save any large amounts of tissue for your health care provider to examine.  Do not use tampons, douche, or have sex until your health care provider approves.  To help you and your partner with the process of grieving, talk with your health care provider or seek counseling.  When you are ready, meet with your health care provider to discuss any important steps you should take for your health. Also, discuss steps you should take to have a healthy pregnancy in the future.  Keep all follow-up visits as told by your health care provider. This is important. Where to find more information  The American Congress of Obstetricians and Gynecologists: www.acog.org  U[place] Department of Health and Cytogeneticist of Women's Health: http://hoffman.com/ Contact a health care provider if:  You have a fever or chills.  You have a foul smelling  vaginal discharge.  You have more bleeding instead of less. Get help right away if:  You have severe cramps or pain in your back or abdomen.  You pass blood clots or tissue from your vagina that is walnut-sized or larger.  You soak more than 1 regular sanitary pad in an hour.  You become light-headed or weak.  You pass out.  You have feelings of sadness that take over your thoughts, or you have thoughts of hurting yourself. Summary  Most miscarriages happen in the first 3 months of pregnancy. Sometimes miscarriage happens before a woman even knows that she is pregnant.  Follow your health care provider's instruction for home care. Keep all  follow-up appointments.  To help you and your partner with the process of grieving, talk with your health care provider or seek counseling. This information is not intended to replace advice given to you by your health care provider. Make sure you discuss any questions you have with your health care provider. Document Revised: 10/01/2018 Document Reviewed: 07/15/2016 Elsevier Patient Education  2020 Elsevier Inc.        Managing Pregnancy Loss Pregnancy loss can happen any time during a pregnancy. Often the cause is not known. It is rarely because of anything you did. Pregnancy loss in early pregnancy (during the first trimester) is called a miscarriage. This type of pregnancy loss is the most common. Pregnancy loss that happens after 20 weeks of pregnancy is called fetal demise if the baby's heart stops beating before birth. Fetal demise is much less common. Some women experience spontaneous labor shortly after fetal demise resulting in a stillborn birth (stillbirth). Any pregnancy loss can be devastating. You will need to recover both physically and emotionally. Most women are able to get pregnant again after a pregnancy loss and deliver a healthy baby. How to manage emotional recovery  Pregnancy loss is very hard emotionally. You may feel many different emotions while you grieve. You may feel sad and angry. You may also feel guilty. It is normal to have periods of crying. Emotional recovery can take longer than physical recovery. It is different for everyone. Taking these steps can help you in managing this loss:  Remember that it is unlikely you did anything to cause the pregnancy loss.  Share your thoughts and feelings with friends, family, and your partner. Remember that your partner is also recovering emotionally.  Make sure you have a good support system. Do not spend too much time alone.  Meet with a pregnancy loss counselor or join a pregnancy loss support group.  Get enough  sleep and eat a healthy diet. Return to regular exercise when you have recovered physically.  Do not use drugs or alcohol to manage your emotions.  Consider seeing a mental health professional to help you recover emotionally.  Ask a friend or loved one to help you decide what to do with any clothing and nursery items you received for your baby. In the case of a stillbirth, many women benefit from taking additional steps in the grieving process. You may want to:  Hold your baby after the birth.  Name your baby.  Request a birth certificate.  Create a keepsake such as handprints or footprints.  Dress your baby and have a picture taken.  Make funeral arrangements.  Ask for a baptism or blessing. Hospitals have staff members who can help you with all these arrangements. How to recognize emotional stress It is normal to have emotional stress after a pregnancy  loss. But emotional stress that lasts a long time or becomes severe requires treatment. Watch out for these signs of severe emotional stress:  Sadness, anger, or guilt that is not going away and is interfering with your normal activities.  Relationship problems that have occurred or gotten worse since the pregnancy loss.  Signs of depression that last longer than 2 weeks. These may include: ? Sadness. ? Anxiety. ? Hopelessness. ? Loss of interest in activities you enjoy. ? Inability to concentrate. ? Trouble sleeping or sleeping too much. ? Loss of appetite or overeating. ? Thoughts of death or of hurting yourself. Follow these instructions at home:  Take over-the-counter and prescription medicines only as told by your health care provider.  Rest at home until your energy level returns. Return to your normal activities as told by your health care provider. Ask your health care provider what activities are safe for you.  When you are ready, meet with your health care provider to discuss steps to take for a future  pregnancy.  Keep all follow-up visits as told by your health care provider. This is important. Where to find support  To help you and your partner with the process of grieving, talk with your health care provider or seek counseling.  Consider meeting with others who have experienced pregnancy loss. Ask your health care provider about support groups and resources. Where to find more information  [place]. Department of Health and Cytogeneticist on Women's Health: http://hoffman.com/  American Pregnancy Association: www.americanpregnancy.org Contact a health care provider if:  You continue to experience grief, sadness, or lack of motivation for everyday activities, and those feelings do not improve over time.  You are struggling to recover emotionally, especially if you are using alcohol or substances to help. Get help right away if:  You have thoughts of hurting yourself or others. If you ever feel like you may hurt yourself or others, or have thoughts about taking your own life, get help right away. You can go to your nearest emergency department or call:  Your local emergency services (911 in the [place]  A suicide crisis helpline, such as the National Suicide Prevention Lifeline at 531-617-9371. This is open 24 hours a day. Summary  Any pregnancy loss can be difficult physically and emotionally.  You may experience many different emotions while you grieve. Emotional recovery can last longer than physical recovery.  It is normal to have emotional stress after a pregnancy loss. But emotional stress that lasts a long time or becomes severe requires treatment.  See your health care provider if you are struggling emotionally after a pregnancy loss. This information is not intended to replace advice given to you by your health care provider. Make sure you discuss any questions you have with your health care provider. Document Revised: 09/29/2018 Document Reviewed:  08/20/2017 Elsevier Patient Education  2020 [employer].        First Trimester of Pregnancy The first trimester of pregnancy is from week 1 until the end of week 13 (months 1 through 3). A week after a sperm fertilizes an egg, the egg will implant on the wall of the uterus. This embryo will begin to develop into a baby. Genes from you and your partner will form the baby. The female genes will determine whether the baby will be a boy or a girl. At 6-8 weeks, the eyes and face will be formed, and the heartbeat can be seen on ultrasound. At the end of 12 weeks,  all the baby's organs will be formed. Now that you are pregnant, you will want to do everything you can to have a healthy baby. Two of the most important things are to get good prenatal care and to follow your health care provider's instructions. Prenatal care is all the medical care you receive before the baby's birth. This care will help prevent, find, and treat any problems during the pregnancy and childbirth. Body changes during your first trimester Your body goes through many changes during pregnancy. The changes vary from woman to woman.  You may gain or lose a couple of pounds at first.  You may feel sick to your stomach (nauseous) and you may throw up (vomit). If the vomiting is uncontrollable, call your health care provider.  You may tire easily.  You may develop headaches that can be relieved by medicines. All medicines should be approved by your health care provider.  You may urinate more often. Painful urination may mean you have a bladder infection.  You may develop heartburn as a result of your pregnancy.  You may develop constipation because certain hormones are causing the muscles that push stool through your intestines to slow down.  You may develop hemorrhoids or swollen veins (varicose veins).  Your breasts may begin to grow larger and become tender. Your nipples may stick out more, and the tissue that surrounds  them (areola) may become darker.  Your gums may bleed and may be sensitive to brushing and flossing.  Dark spots or blotches (chloasma, mask of pregnancy) may develop on your face. This will likely fade after the baby is born.  Your menstrual periods will stop.  You may have a loss of appetite.  You may develop cravings for certain kinds of food.  You may have changes in your emotions from day to day, such as being excited to be pregnant or being concerned that something may go wrong with the pregnancy and baby.  You may have more vivid and strange dreams.  You may have changes in your hair. These can include thickening of your hair, rapid growth, and changes in texture. Some women also have hair loss during or after pregnancy, or hair that feels dry or thin. Your hair will most likely return to normal after your baby is born. What to expect at prenatal visits During a routine prenatal visit:  You will be weighed to make sure you and the baby are growing normally.  Your blood pressure will be taken.  Your abdomen will be measured to track your baby's growth.  The fetal heartbeat will be listened to between weeks 10 and 14 of your pregnancy.  Test results from any previous visits will be discussed. Your health care provider may ask you:  How you are feeling.  If you are feeling the baby move.  If you have had any abnormal symptoms, such as leaking fluid, bleeding, severe headaches, or abdominal cramping.  If you are using any tobacco products, including cigarettes, chewing tobacco, and electronic cigarettes.  If you have any questions. Other tests that may be performed during your first trimester include:  Blood tests to find your blood type and to check for the presence of any previous infections. The tests will also be used to check for low iron levels (anemia) and protein on red blood cells (Rh antibodies). Depending on your risk factors, or if you previously had diabetes  during pregnancy, you may have tests to check for high blood sugar that affects pregnant  women (gestational diabetes).  Urine tests to check for infections, diabetes, or protein in the urine.  An ultrasound to confirm the proper growth and development of the baby.  Fetal screens for spinal cord problems (spina bifida) and Down syndrome.  HIV (human immunodeficiency virus) testing. Routine prenatal testing includes screening for HIV, unless you choose not to have this test.  You may need other tests to make sure you and the baby are doing well. Follow these instructions at home: Medicines  Follow your health care provider's instructions regarding medicine use. Specific medicines may be either safe or unsafe to take during pregnancy.  Take a prenatal vitamin that contains at least 600 micrograms (mcg) of folic acid.  If you develop constipation, try taking a stool softener if your health care provider approves. Eating and drinking   Eat a balanced diet that includes fresh fruits and vegetables, whole grains, good sources of protein such as meat, eggs, or tofu, and low-fat dairy. Your health care provider will help you determine the amount of weight gain that is right for you.  Avoid raw meat and uncooked cheese. These carry germs that can cause birth defects in the baby.  Eating four or five small meals rather than three large meals a day may help relieve nausea and vomiting. If you start to feel nauseous, eating a few soda crackers can be helpful. Drinking liquids between meals, instead of during meals, also seems to help ease nausea and vomiting.  Limit foods that are high in fat and processed sugars, such as fried and sweet foods.  To prevent constipation: ? Eat foods that are high in fiber, such as fresh fruits and vegetables, whole grains, and beans. ? Drink enough fluid to keep your urine clear or pale yellow. Activity  Exercise only as directed by your health care provider.  Most women can continue their usual exercise routine during pregnancy. Try to exercise for 30 minutes at least 5 days a week. Exercising will help you: ? Control your weight. ? Stay in shape. ? Be prepared for labor and delivery.  Experiencing pain or cramping in the lower abdomen or lower back is a good sign that you should stop exercising. Check with your health care provider before continuing with normal exercises.  Try to avoid standing for long periods of time. Move your legs often if you must stand in one place for a long time.  Avoid heavy lifting.  Wear low-heeled shoes and practice good posture.  You may continue to have sex unless your health care provider tells you not to. Relieving pain and discomfort  Wear a good support bra to relieve breast tenderness.  Take warm sitz baths to soothe any pain or discomfort caused by hemorrhoids. Use hemorrhoid cream if your health care provider approves.  Rest with your legs elevated if you have leg cramps or low back pain.  If you develop varicose veins in your legs, wear support hose. Elevate your feet for 15 minutes, 3-4 times a day. Limit salt in your diet. Prenatal care  Schedule your prenatal visits by the twelfth week of pregnancy. They are usually scheduled monthly at first, then more often in the last 2 months before delivery.  Write down your questions. Take them to your prenatal visits.  Keep all your prenatal visits as told by your health care provider. This is important. Safety  Wear your seat belt at all times when driving.  Make a list of emergency phone numbers, including numbers for  family, friends, the hospital, and police and fire departments. General instructions  Ask your health care provider for a referral to a local prenatal education class. Begin classes no later than the beginning of month 6 of your pregnancy.  Ask for help if you have counseling or nutritional needs during pregnancy. Your health care  provider can offer advice or refer you to specialists for help with various needs.  Do not use hot tubs, steam rooms, or saunas.  Do not douche or use tampons or scented sanitary pads.  Do not cross your legs for long periods of time.  Avoid cat litter boxes and soil used by cats. These carry germs that can cause birth defects in the baby and possibly loss of the fetus by miscarriage or stillbirth.  Avoid all smoking, herbs, alcohol, and medicines not prescribed by your health care provider. Chemicals in these products affect the formation and growth of the baby.  Do not use any products that contain nicotine or tobacco, such as cigarettes and e-cigarettes. If you need help quitting, ask your health care provider. You may receive counseling support and other resources to help you quit.  Schedule a dentist appointment. At home, brush your teeth with a soft toothbrush and be gentle when you floss. Contact a health care provider if:  You have dizziness.  You have mild pelvic cramps, pelvic pressure, or nagging pain in the abdominal area.  You have persistent nausea, vomiting, or diarrhea.  You have a bad smelling vaginal discharge.  You have pain when you urinate.  You notice increased swelling in your face, hands, legs, or ankles.  You are exposed to fifth disease or chickenpox.  You are exposed to [place] measles (rubella) and have never had it. Get help right away if:  You have a fever.  You are leaking fluid from your vagina.  You have spotting or bleeding from your vagina.  You have severe abdominal cramping or pain.  You have rapid weight gain or loss.  You vomit blood or material that looks like coffee grounds.  You develop a severe headache.  You have shortness of breath.  You have any kind of trauma, such as from a fall or a car accident. Summary  The first trimester of pregnancy is from week 1 until the end of week 13 (months 1 through 3).  Your body goes  through many changes during pregnancy. The changes vary from woman to woman.  You will have routine prenatal visits. During those visits, your health care provider will examine you, discuss any test results you may have, and talk with you about how you are feeling. This information is not intended to replace advice given to you by your health care provider. Make sure you discuss any questions you have with your health care provider. Document Revised: 05/22/2017 Document Reviewed: 05/21/2016 Elsevier Patient Education  2020 [employer].

## 2020-06-28 NOTE — Telephone Encounter (Addendum)
Patient reports she started bleeding yesterday morning and continued throughout the day. She reports it started as spotting with wiping and increased to maroon leaking out of vagina throughout the day.   She has continued with the mild cramping over the last week. There was enough blood to fill a panty liner over the course of the day.   Today she is having brown drainage with a drop of blood in the toilet.    Reviewed with Luna Kitchens, CNM who advised patient should seek care in the [facility]. Patient reports she is aware of location.   Patient reports she is Covid +, reviewed she should still go and wear a mask and wash hands when entering building. Reviewed if anyone goes with her they may not be able to go in with her. Flag placed in chart.   Patient tearful and reports she has someone who can drive her to the hospital.

## 2020-06-29 LAB — GC/CHLAMYDIA PROBE AMP (~~LOC~~) NOT AT ARMC
Chlamydia: NEGATIVE
Comment: NEGATIVE
Comment: NORMAL
Neisseria Gonorrhea: NEGATIVE

## 2020-06-30 ENCOUNTER — Other Ambulatory Visit: Payer: Self-pay

## 2020-06-30 ENCOUNTER — Inpatient Hospital Stay (HOSPITAL_COMMUNITY)
Admission: AD | Admit: 2020-06-30 | Discharge: 2020-06-30 | Disposition: A | Discharge status: Home / Self Care | Payer: Self-pay | Attending: Family Medicine | Admitting: Family Medicine

## 2020-06-30 DIAGNOSIS — O3680X0 Pregnancy with inconclusive fetal viability, not applicable or unspecified: Secondary | ICD-10-CM

## 2020-06-30 DIAGNOSIS — O98511 Other viral diseases complicating pregnancy, first trimester: Secondary | ICD-10-CM | POA: Insufficient documentation

## 2020-06-30 DIAGNOSIS — D72829 Elevated white blood cell count, unspecified: Secondary | ICD-10-CM | POA: Insufficient documentation

## 2020-06-30 DIAGNOSIS — O26893 Other specified pregnancy related conditions, third trimester: Secondary | ICD-10-CM | POA: Insufficient documentation

## 2020-06-30 DIAGNOSIS — Z3A01 Less than 8 weeks gestation of pregnancy: Secondary | ICD-10-CM | POA: Insufficient documentation

## 2020-06-30 DIAGNOSIS — U071 COVID-19: Secondary | ICD-10-CM | POA: Insufficient documentation

## 2020-06-30 LAB — HCG, QUANTITATIVE, PREGNANCY: hCG, Beta Chain, Quant, S: 9909 m[IU]/mL — ABNORMAL HIGH (ref <5)

## 2020-06-30 LAB — CBC
HCT: 36.2 % (ref 36.0–46.0)
Hemoglobin: 11.8 g/dL — ABNORMAL LOW (ref 12.0–15.0)
MCH: 29.3 pg (ref 26.0–34.0)
MCHC: 32.6 g/dL (ref 30.0–36.0)
MCV: 89.8 fL (ref 80.0–100.0)
Platelets: 102 10*3/uL — ABNORMAL LOW (ref 150–400)
RBC: 4.03 MIL/uL (ref 3.87–5.11)
RDW: 12.9 % (ref 11.5–15.5)
WBC: 2.3 10*3/uL — ABNORMAL LOW (ref 4.0–10.5)
nRBC: 0 % (ref 0.0–0.2)

## 2020-06-30 NOTE — [facility] Provider Note (Signed)
Event Date/Time   First Provider Initiated Contact with Patient 06/30/20 1124      S [name] is a 30 y.o. [id number] patient who presents to [facility] today for f/u HCG for pregnancy of unknown location. She is also COVID positive - symptoms improving. Diagnosed 5 days ago.   O BP 118/68 (BP Location: Right Arm)   Pulse 69   Temp 98.5 F (36.9 C) (Oral)   Resp 16   LMP 05/03/2020   SpO2 100% Comment: room air Physical Exam Vitals reviewed.  Constitutional:      Appearance: Normal appearance.  Abdominal:     General: Abdomen is flat.     Palpations: Abdomen is soft.  Neurological:     General: No focal deficit present.     Mental Status: She is alert.  Psychiatric:        Mood and Affect: Mood normal.        Behavior: Behavior normal.        Thought Content: Thought content normal.        Judgment: Judgment normal.     A Medical screening exam complete Pregnancy of unknown location Leukocytosis  P CBC HCG - inappropriate rise. Discussed MTX vs outpt [place] in 10 days to reevaluate. Patient would like to wait and recheck [place].  Return precautions given.  Discharge from [facility] in stable condition Patient may return to [facility] as needed   Levie Heritage, DO 06/30/2020 11:33 AM

## 2020-06-30 NOTE — [facility] Note (Signed)
Pt will wait in car for lab results

## 2020-06-30 NOTE — [facility] Note (Signed)
Megan Rocha is a 30 y.o. at [redacted]w[redacted]d here in [facility] reporting: here for follow up hcg. States she is having some brown discharge but no active bleeding. No pain. States her covid symptoms are also resolving, just has a tickle in her throat.   Onset of complaint: ongoing  Pain score:  +FM  Vitals:   06/30/20 1123  BP: 118/68  Pulse: 69  Resp: 16  Temp: 98.5 F (36.9 C)  SpO2: 100%     Lab orders placed from triage: hcg

## 2020-07-02 ENCOUNTER — Inpatient Hospital Stay (HOSPITAL_COMMUNITY)
Admission: AD | Admit: 2020-07-02 | Discharge: 2020-07-02 | Disposition: A | Discharge status: Home / Self Care | Payer: Self-pay | Attending: Obstetrics and Gynecology | Admitting: Obstetrics and Gynecology

## 2020-07-02 ENCOUNTER — Other Ambulatory Visit: Payer: Self-pay

## 2020-07-02 ENCOUNTER — Inpatient Hospital Stay (HOSPITAL_COMMUNITY): Payer: Self-pay

## 2020-07-02 DIAGNOSIS — O039 Complete or unspecified spontaneous abortion without complication: Secondary | ICD-10-CM | POA: Insufficient documentation

## 2020-07-02 LAB — CBC
HCT: 37.3 % (ref 36.0–46.0)
Hemoglobin: 12.1 g/dL (ref 12.0–15.0)
MCH: 29 pg (ref 26.0–34.0)
MCHC: 32.4 g/dL (ref 30.0–36.0)
MCV: 89.4 fL (ref 80.0–100.0)
Platelets: 117 10*3/uL — ABNORMAL LOW (ref 150–400)
RBC: 4.17 MIL/uL (ref 3.87–5.11)
RDW: 12.6 % (ref 11.5–15.5)
WBC: 4.3 10*3/uL (ref 4.0–10.5)
nRBC: 0 % (ref 0.0–0.2)

## 2020-07-02 LAB — HCG, QUANTITATIVE, PREGNANCY: hCG, Beta Chain, Quant, S: 12170 m[IU]/mL — ABNORMAL HIGH (ref <5)

## 2020-07-02 MED ORDER — PROMETHAZINE HCL 12.5 MG PO TABS: 12.5000 mg | ORAL_TABLET | Freq: Four times a day (QID) | ORAL | 0 refills | Status: DC | PRN

## 2020-07-02 MED ORDER — LACTATED RINGERS IV SOLN: INTRAVENOUS | Status: DC

## 2020-07-02 MED ORDER — HYDROCODONE-ACETAMINOPHEN 5-325 MG PO TABS: 1.0000 | ORAL_TABLET | ORAL | 0 refills | Status: AC | PRN

## 2020-07-02 MED ORDER — HYDROCODONE-ACETAMINOPHEN 5-325 MG PO TABS: 1.0000 | ORAL_TABLET | Freq: Once | ORAL | Status: DC

## 2020-07-02 NOTE — Discharge Instructions (Signed)
Miscarriage A miscarriage is the loss of pregnancy before the 20th week. Most miscarriages happen during the first 3 months of pregnancy. Sometimes, a miscarriage can happen before a woman knows that she is pregnant. Having a miscarriage can be an emotional experience. If you have had a miscarriage, talk with your health care provider about any questions you may have about the loss of your baby, the grieving process, and your plans for future pregnancy. What are the causes? Many times, the cause of a miscarriage is not known. What increases the risk? The following factors may make a pregnant woman more likely to have a miscarriage: Certain medical conditions  Conditions that affect the hormone balance in the body, such as thyroid disease or polycystic ovary syndrome.  Diabetes.  Autoimmune disorders.  Infections.  Bleeding disorders.  Obesity. Lifestyle factors  Using products with tobacco or nicotine in them or being exposed to tobacco smoke.  Having alcohol.  Having large amounts of caffeine.  Recreational drug use. Problems with reproductive organs or structures  Cervical insufficiency. This is when the lowest part of the uterus (cervix) opens and thins before pregnancy is at term.  Having a condition called Asherman syndrome. This syndrome causes scarring in the uterus or causes the uterus to be abnormal in structure.  Fibrous growths, called fibroids, in the uterus.  Congenital abnormalities. These problems are present at birth.  Infection of the cervix or uterus. Personal or medical history  Injury (trauma).  Having had a miscarriage before.  Being younger than age 18 or older than age 35.  Exposure to harmful substances in the environment. This may include radiation or heavy metals, such as lead.  Use of certain medicines. What are the signs or symptoms? Symptoms of this condition include:  Vaginal bleeding or spotting, with or without cramps or  pain.  Pain or cramping in the abdomen or lower back.  Fluid or tissue coming out of the vagina. How is this diagnosed? This condition may be diagnosed based on:  A physical exam.  Ultrasound.  Lab tests, such as blood tests, urine tests, or swabs for infection. How is this treated? Treatment for a miscarriage is sometimes not needed if all the pregnancy tissue that was in the uterus comes out on its own, and there are no other problems such as infection or heavy bleeding. In other cases, this condition may be treated with:  Dilation and curettage (D&C). In this procedure, the cervix is stretched open and any remaining pregnancy tissue is removed from the lining of the uterus (endometrium).  Medicines. These may include: ? Antibiotic medicine, to treat infection. ? Medicine to help any remaining pregnancy tissue come out of the body. ? Medicine to reduce (contract) the size of the uterus. These medicines may be given if there is a lot of bleeding. If you have Rh-negative blood, you may be given an injection of a medicine called Rho(D) immune globulin. This medicine helps prevent problems with future pregnancies. Follow these instructions at home: Medicines  Take over-the-counter and prescription medicines only as told by your health care provider.  If you were prescribed antibiotic medicine, take it as told by your health care provider. Do not stop taking the antibiotic even if you start to feel better. Activity  Rest as told by your health care provider. Ask your health care provider what activities are safe for you.  Have someone help with home and family responsibilities during this time. General instructions  Monitor how much tissue   or blood clot material comes out of the vagina.  Do not have sex, douche, or put anything, such as tampons, in your vagina until your health care provider says it is okay.  To help you and your partner with the grieving process, talk with your  health care provider or get counseling.  When you are ready, meet with your health care provider to discuss any important steps you should take for your health. Also, discuss steps you should take to have a healthy pregnancy in the future.  Keep all follow-up visits. This is important.   Where to find more information  The American College of Obstetricians and Gynecologists: acog.org  [place] Department of Health and Human Services Office of Women's Health: hrsa.gov/office-womens-health Contact a health care provider if:  You have a fever or chills.  There is bad-smelling fluid coming from the vagina.  You have more bleeding instead of less.  Tissue or blood clots come out of your vagina. Get help right away if:  You have severe cramps or pain in your back or abdomen.  Heavy bleeding soaks through 2 large sanitary pads an hour for more than 2 hours.  You become light-headed or weak.  You faint.  You feel sad, and your sadness takes over your thoughts.  You think about hurting yourself. If you ever feel like you may hurt yourself or others, or have thoughts about taking your own life, get help right away. Go to your nearest emergency department or:  Call your local emergency services (911 in the [place]  Call a suicide crisis helpline, such as the National Suicide Prevention Lifeline at 1-800-273-8255. This is open 24 hours a day in the [place]  Text the Crisis Text Line at 741741 (in the [place] Summary  Most miscarriages happen in the first 3 months of pregnancy. Sometimes miscarriage happens before a woman knows that she is pregnant.  Follow instructions from your health care provider about medicines and activity.  To help you and your partner with grieving, talk with your health care provider or get counseling.  Keep all follow-up visits. This information is not intended to replace advice given to you by your health care provider. Make sure you discuss any questions you  have with your health care provider. Document Revised: 12/09/2019 Document Reviewed: 12/09/2019 Elsevier Patient Education  2021 Elsevier Inc.  

## 2020-07-02 NOTE — MAU Note (Signed)
Megan Rocha is a 30 y.o. at [redacted]w[redacted]d here in MAU reporting: states bleeding has increased since previous MAU visit. Has changed a pad twice and is seeing it in the toilet when she uses the bathroom, states similar to a period. Is having lower abdominal and back cramping.  Onset of complaint: ongoing  Pain score: 2/10  Vitals:   07/02/20 1807  BP: 115/68  Pulse: 65  Resp: 18  Temp: 98.6 F (37 C)  SpO2: 100%     Lab orders placed from triage: UA

## 2020-07-02 NOTE — MAU Provider Note (Addendum)
History     CSN: [id number]  Arrival date and time: 07/02/20 1738   Event Date/Time   First Provider Initiated Contact with Patient 07/02/20 1817      Chief Complaint  Patient presents with  . Abdominal Pain  . Vaginal Bleeding   HPI Joury is a 30 yo G3P2002 patient who presents to MAU today for f/u HCG for pregnancy of unknown location with increased bleeding starting a few hours before arrival. Patient noticed that when she went to the bathroom this afternoon blood was coming out so she came here. While going the bathroom here the blood was "pouring" out and she kept wiping with continual bleeding. She reports changing 2 pads and soaking through 1 overnight pad while here. Patient reports also having lower abdominal pain and cramping. She denies dizziness, HA, changes in vision.  Patient was here 2 days ago and 4 days ago for bleeding with inappropriate rise in HCG and no sac/embryo noted on [place].  Patient denies cough, SOB or rhinorrhea.  OB History    Gravida  3   Para  2   Term  2   Preterm      AB      Living  2     SAB      IAB      Ectopic      Multiple  0   Live Births  2           Past Medical History:  Diagnosis Date  . BV (bacterial vaginosis)   . PONV (postoperative nausea and vomiting)    from epidural  . UTI (urinary tract infection)   . Yeast vaginitis     Past Surgical History:  Procedure Laterality Date  . colposcopy    . NO PAST SURGERIES      Family History  Problem Relation Age of Onset  . Breast cancer Paternal Grandmother     Social History   Tobacco Use  . Smoking status: Never Smoker  . Smokeless tobacco: Never Used  Vaping Use  . Vaping Use: Never used  Substance Use Topics  . Alcohol use: Not Currently    Comment: occasional   . 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 Penicillins Anaphylaxis    Medications  Prior to Admission  Medication Sig Dispense Refill Last Dose  . acetaminophen (TYLENOL) 325 MG tablet Take 2 tablets (650 mg total) by mouth every 4 (four) hours as needed (for pain scale < 4).     . sertraline (ZOLOFT) 25 MG tablet Take 2 tablets (50 mg total) by mouth daily. 30 tablet 3     Review of Systems  Constitutional: Negative for fever.  HENT: Negative for congestion.   Respiratory: Negative for cough and shortness of breath.   Gastrointestinal: Positive for abdominal pain.       Abdominal cramping  Genitourinary: Positive for vaginal bleeding.   Physical Exam   Blood pressure 115/68, pulse 65, temperature 98.6 F (37 C), temperature source Oral, resp. rate 18, height 5\' 10"  (1.778 m), weight 68.1 kg, last menstrual period 05/03/2020, SpO2 100 %, unknown if currently breastfeeding.  Physical Exam Constitutional:      Appearance: She is normal weight. She is not ill-appearing.     Comments: Tearful  Cardiovascular:     Rate and Rhythm: Normal rate and regular rhythm.  Heart sounds: Normal heart sounds.  Pulmonary:     Effort: Pulmonary effort is normal. No respiratory distress.     Breath sounds: Normal breath sounds. No wheezing, rhonchi or rales.  Abdominal:     General: Abdomen is flat.     Palpations: Abdomen is soft.     Tenderness: There is abdominal tenderness in the suprapubic area.  Neurological:     General: No focal deficit present.     Mental Status: She is alert and oriented to person, place, and time.  Psychiatric:     Comments: Tearful, mood congruent     MAU Course  Procedures  MDM Patient was seen here 2 days ago for return visit for vaginal bleeding with inappropriate rise in HCG and previous US showing gestational sac at the fundus w/o yolk sac/embryo. Was offered MTX versus repeat US in 10 and chose to wait. She presented with increasing bleeding today. HCG today showed . HGB 12.1 increased from 11.8 two days ago but stable from 12.1 four  days ago. Will not repeat US as she had one four days ago. Patient is HDS. Likely first trimester spontaneous abortion.   First Trimester Spontaneous Abortion - Expectant management - Return precautions given - May return to MAU as needed - Discharge from MAU in __ condition  Melvyn Neth 07/02/2020, 6:25 PM   Attestation of Supervision of Student:  I confirm that I have verified the information documented in the medical student's note and that I have also personally reperformed the history, physical exam and all medical decision making activities.  I have verified that all services and findings are accurately documented in this student's note; and I agree with management and plan as outlined in the documentation. I have also made any necessary editorial changes.  Results for orders placed or performed during the hospital encounter of 07/02/20 (from the past 24 hour(s))  CBC     Status: Abnormal   Collection Time: 07/02/20  6:43 PM  Result Value Ref Range   WBC 4.3 4.0 - 10.5 K/uL   RBC 4.17 3.87 - 5.11 MIL/uL   Hemoglobin 12.1 12.0 - 15.0 g/dL   HCT 01.7 79.3 - 90.3 %   MCV 89.4 80.0 - 100.0 fL   MCH 29.0 26.0 - 34.0 pg   MCHC 32.4 30.0 - 36.0 g/dL   RDW 00.9 23.3 - 00.7 %   Platelets 117 (L) 150 - 400 K/uL   nRBC 0.0 0.0 - 0.2 %  hCG, quantitative, pregnancy     Status: Abnormal   Collection Time: 07/02/20  6:43 PM  Result Value Ref Range   hCG, Beta Chain, Quant, S 12,170 (H) <5 mIU/mL   US OB Transvaginal  Result Date: 07/02/2020 CLINICAL DATA:  Vaginal bleeding EXAM: TRANSVAGINAL OB ULTRASOUND TECHNIQUE: Transvaginal ultrasound was performed for complete evaluation of the gestation as well as the maternal uterus, adnexal regions, and pelvic cul-de-sac. COMPARISON:  None. FINDINGS: Intrauterine gestational sac: None Yolk sac:  Not Visualized. Embryo:  Not Visualized. Cardiac Activity: Not Visualized. Heart Rate:  bpm MSD:   mm    w     d CRL:     mm    w  d                   [place] EDC: Subchorionic hemorrhage:  None visualized. Maternal uterus/adnexae: No adnexal mass or free fluid. IMPRESSION: Previously seen early intrauterine gestational sac no longer visualized suspicious for spontaneous abortion. Electronically Signed  By: Charlett Nose M.D.   On: 07/02/2020 21:26   Meds ordered this encounter  Medications  . lactated ringers infusion  . promethazine (PHENERGAN) 12.5 MG tablet    Sig: Take 1 tablet (12.5 mg total) by mouth every 6 (six) hours as needed for nausea or vomiting.    Dispense:  30 tablet    Refill:  0    Order Specific Question:   Supervising Provider    Answer:   Levie Heritage [[id number]]  . HYDROcodone-acetaminophen (NORCO/VICODIN) 5-325 MG tablet    Sig: Take 1 tablet by mouth every 4 (four) hours as needed for up to 3 days.    Dispense:  18 tablet    Refill:  0    Order Specific Question:   Supervising Provider    Answer:   Warden Fillers [[id number]]   Assessment and Plan  --30 y.o. G3P2002 at [redacted]w[redacted]d- --Miscarriage --Pad examined by CNM, small amount of bleeding, not saturated --Denies pain, tolerating PO in MAU --Discharge home in stable condition with bleeding precautions  F/U: --Non-stat Quant hCG at [facility] in one week  Clayton Bibles, MSN, CNM Certified Nurse Midwife, [employer] for [employer], [facility] 07/02/20 10:07 PM

## 2020-07-09 ENCOUNTER — Ambulatory Visit: Payer: Self-pay | Admitting: Obstetrics and Gynecology

## 2020-07-18 ENCOUNTER — Ambulatory Visit: Admission: RE | Admit: 2020-07-18 | Payer: Self-pay | Source: Ambulatory Visit

## 2020-07-19 ENCOUNTER — Ambulatory Visit (INDEPENDENT_AMBULATORY_CARE_PROVIDER_SITE_OTHER): Payer: Self-pay | Admitting: Obstetrics and Gynecology

## 2020-07-19 ENCOUNTER — Ambulatory Visit: Payer: Self-pay | Admitting: Clinical

## 2020-07-19 ENCOUNTER — Other Ambulatory Visit: Payer: Self-pay

## 2020-07-19 ENCOUNTER — Encounter: Payer: Self-pay | Admitting: Obstetrics and Gynecology

## 2020-07-19 VITALS — Wt 147.0 lb

## 2020-07-19 DIAGNOSIS — F32A Depression, unspecified: Secondary | ICD-10-CM

## 2020-07-19 DIAGNOSIS — Z30011 Encounter for initial prescription of contraceptive pills: Secondary | ICD-10-CM

## 2020-07-19 DIAGNOSIS — F4321 Adjustment disorder with depressed mood: Secondary | ICD-10-CM

## 2020-07-19 DIAGNOSIS — O039 Complete or unspecified spontaneous abortion without complication: Secondary | ICD-10-CM | POA: Insufficient documentation

## 2020-07-19 DIAGNOSIS — F39 Unspecified mood [affective] disorder: Secondary | ICD-10-CM

## 2020-07-19 MED ORDER — NORGESTIMATE-ETH ESTRADIOL 0.25-35 MG-MCG PO TABS: 1.0000 | ORAL_TABLET | Freq: Every day | ORAL | 11 refills | Status: DC

## 2020-07-19 NOTE — BH Specialist Note (Signed)
Integrated Behavioral Health Follow Up In-Person Visit  MRN: [id number] Name: Megan Rocha  Number of Integrated Behavioral Health Clinician visits: 4/6  10 minute in-person visit; pt requests referral to [facility] for psychiatry/BH medication management and ongoing therapy. Pt also given list of PCP options on her AVS.   Pt is aware she may use [facility] 24/7 as needed, and that [facility] Asher Muir will call in two weeks for brief phone check. Pt may call Asher Muir at [phone] as needed prior to f/u call.   [name] Simona Rocque, LCSW

## 2020-07-19 NOTE — Progress Notes (Signed)
   Subjective:    Patient ID: Megan Rocha, female    DOB: [birth year], 30 y.o.   MRN: [id number]  HPI 30 yo G3P2 seen for follow up of spontaneous miscarriage.  She states that she believes she passed most of the POC on 07/02/20.  She had light bleeding another 5-6 days and then is stopped.  She denies any further complaints.   Review of Systems  Constitutional: Negative.   HENT: Negative.   Eyes: Negative.   Respiratory: Negative.   Cardiovascular: Negative.   Gastrointestinal: Negative.   Endocrine: Negative.   Genitourinary: Negative.   Musculoskeletal: Negative.        Objective:   Physical Exam Vitals reviewed.  Constitutional:      General: She is not in acute distress.    Appearance: Normal appearance. She is normal weight.  HENT:     Head: Atraumatic.  Cardiovascular:     Rate and Rhythm: Normal rate and regular rhythm.     Heart sounds: Normal heart sounds.  Pulmonary:     Breath sounds: Normal breath sounds.  Abdominal:     Palpations: Abdomen is soft.     Tenderness: There is no abdominal tenderness.  Musculoskeletal:        General: Normal range of motion.  Neurological:     Mental Status: She is alert.   There were no vitals filed for this visit.        Assessment & Plan:   1. Mood disorder (HCC)   2. Depression, unspecified depression type Pt was seen by behavorial health specialist today and recommends further counseling - Ambulatory referral to Integrated Behavioral Health  3. SAB (spontaneous abortion) Discussed etiology of SAB.  Pt does not want to be pregnant for at least a year.  Rx for sprintec sent to [facility] as pt does not have insurance at this time.  Long term contraceptives discussed, but pt is unsure of their use at this time.  F/u PRN    Warden Fillers, MD Faculty Attending, Center for [facility]

## 2020-07-19 NOTE — Patient Instructions (Addendum)
Miscarriage A miscarriage is the loss of pregnancy before the 20th week. Most miscarriages happen during the first 3 months of pregnancy. Sometimes, a miscarriage can happen before a woman knows that she is pregnant. Having a miscarriage can be an emotional experience. If you have had a miscarriage, talk with your health care provider about any questions you may have about the loss of your baby, the grieving process, and your plans for future pregnancy. What are the causes? Many times, the cause of a miscarriage is not known. What increases the risk? The following factors may make a pregnant woman more likely to have a miscarriage: Certain medical conditions  Conditions that affect the hormone balance in the body, such as thyroid disease or polycystic ovary syndrome.  Diabetes.  Autoimmune disorders.  Infections.  Bleeding disorders.  Obesity. Lifestyle factors  Using products with tobacco or nicotine in them or being exposed to tobacco smoke.  Having alcohol.  Having large amounts of caffeine.  Recreational drug use. Problems with reproductive organs or structures  Cervical insufficiency. This is when the lowest part of the uterus (cervix) opens and thins before pregnancy is at term.  Having a condition called Asherman syndrome. This syndrome causes scarring in the uterus or causes the uterus to be abnormal in structure.  Fibrous growths, called fibroids, in the uterus.  Congenital abnormalities. These problems are present at birth.  Infection of the cervix or uterus. Personal or medical history  Injury (trauma).  Having had a miscarriage before.  Being younger than age 18 or older than age 35.  Exposure to harmful substances in the environment. This may include radiation or heavy metals, such as lead.  Use of certain medicines. What are the signs or symptoms? Symptoms of this condition include:  Vaginal bleeding or spotting, with or without cramps or  pain.  Pain or cramping in the abdomen or lower back.  Fluid or tissue coming out of the vagina. How is this diagnosed? This condition may be diagnosed based on:  A physical exam.  Ultrasound.  Lab tests, such as blood tests, urine tests, or swabs for infection. How is this treated? Treatment for a miscarriage is sometimes not needed if all the pregnancy tissue that was in the uterus comes out on its own, and there are no other problems such as infection or heavy bleeding. In other cases, this condition may be treated with:  Dilation and curettage (D&C). In this procedure, the cervix is stretched open and any remaining pregnancy tissue is removed from the lining of the uterus (endometrium).  Medicines. These may include: ? Antibiotic medicine, to treat infection. ? Medicine to help any remaining pregnancy tissue come out of the body. ? Medicine to reduce (contract) the size of the uterus. These medicines may be given if there is a lot of bleeding. If you have Rh-negative blood, you may be given an injection of a medicine called Rho(D) immune globulin. This medicine helps prevent problems with future pregnancies. Follow these instructions at home: Medicines  Take over-the-counter and prescription medicines only as told by your health care provider.  If you were prescribed antibiotic medicine, take it as told by your health care provider. Do not stop taking the antibiotic even if you start to feel better. Activity  Rest as told by your health care provider. Ask your health care provider what activities are safe for you.  Have someone help with home and family responsibilities during this time. General instructions  Monitor how much tissue   or blood clot material comes out of the vagina.  Do not have sex, douche, or put anything, such as tampons, in your vagina until your health care provider says it is okay.  To help you and your partner with the grieving process, talk with your  health care provider or get counseling.  When you are ready, meet with your health care provider to discuss any important steps you should take for your health. Also, discuss steps you should take to have a healthy pregnancy in the future.  Keep all follow-up visits. This is important.   Where to find more information  The [employer] of [employer]necologists: acog.org  U.[place]. Department of Health and Cytogeneticist of Women's Health: http://hoffman.com/ Contact a health care provider if:  You have a fever or chills.  There is bad-smelling fluid coming from the vagina.  You have more bleeding instead of less.  Tissue or blood clots come out of your vagina. Get help right away if:  You have severe cramps or pain in your back or abdomen.  Heavy bleeding soaks through 2 large sanitary pads an hour for more than 2 hours.  You become light-headed or weak.  You faint.  You feel sad, and your sadness takes over your thoughts.  You think about hurting yourself. If you ever feel like you may hurt yourself or others, or have thoughts about taking your own life, get help right away. Go to your nearest emergency department or:  Call your local emergency services (911 in the [place]  Call a suicide crisis helpline, such as the National Suicide Prevention Lifeline at (805)623-0198. This is open 24 hours a day in the [place]  Text the Crisis Text Line at 845 511 0487 (in the [place] Summary  Most miscarriages happen in the first 3 months of pregnancy. Sometimes miscarriage happens before a woman knows that she is pregnant.  Follow instructions from your health care provider about medicines and activity.  To help you and your partner with grieving, talk with your health care provider or get counseling.  Keep all follow-up visits. This information is not intended to replace advice given to you by your health care provider. Make sure you discuss any questions you  have with your health care provider. Document Revised: 12/09/2019 Document Reviewed: 12/09/2019 Elsevier Patient Education  2021 [employer].   [facility] [employer] [facility] [address] [place], [place] [address] [phone] (main office) [phone] (Jamie's office)  [facility] accepting new patients:   [facility] at [facility] [address] [place], [place] [address] [phone]  [facility] [address] [place], [place] [address] [phone]  [facility] [address] [place], [place] [address] [phone]  [facility] [address] [place], [place] [address] [phone]  [facility] [address] [place],  [place]  [address] [phone]  Oral Contraception Use Oral contraceptive pills (OCPs) are medicines that prevent pregnancy. OCPs work by:  Preventing the ovaries from releasing eggs.  Thickening mucus in the lower part of the uterus (cervix). This prevents sperm from entering the uterus.  Thinning the lining of the uterus (endometrium). This prevents a fertilized egg from attaching to the endometrium. Discuss possible side effects of OCPs with your health care provider. It can take 2-3 months for your body to adjust to changes in hormone levels. What are the risks? OCPs can  sometimes cause side effects, such as:  Headache.  Depression.  Trouble sleeping.  Nausea and vomiting.  Breast tenderness.  Irregular bleeding or spotting during the first several months.  Bloating or fluid retention.  Increase in blood pressure. OCPs with estrogen and progestins may slightly increase the risk of:  Blood clots.  Heart attack.  Stroke. How to take OCPs Follow instructions from your health care provider about how to take your first cycle of OCPs. There are 2 types of OCPs. The first,  combination OCPs, have both estrogen and progestins. The second, progestin-only pills, have only progestin.  For combination OCPs, you may start the pill: ? On day 1 of your menstrual period. ? On the first Sunday after your period starts, or on the day you get your prescription. ? At any time of your cycle. ? If you start taking the pill within 5 days after the start of your period, you will not need a backup form of birth control, such as condoms. ? If you start at any other time of your menstrual cycle, you will need to use a backup form of birth control.  For progestin-only OCPs: ? Ideally, you can start taking the pill on the first day of your menstrual period, but you can start it on any other day too. ? These pills will protect you from pregnancy after taking it for 2 days (48 hours). You can stop using a backup form of birth control after that time. It is important that you take this pill at the same time every day. Even taking it 3 hours late can increase the risk of pregnancy. No matter which day you start the OCP, you will always start a new pack on that same day of the week. Have an extra pack of OCPs and a backup contraceptive method available in case you miss some pills or lose your OCP pack. Missed doses Follow instructions from your health care provider for missed doses. Information about missed doses can also be found in the patient information sheet that comes with your pack of pills.  In general, for combined OCPs: ? If you forget to take the pill for 1 day, take it as soon as you can. This may mean taking 2 pills on the same day and at the same time. Take the next day's pill at the regular time. ? If you forget to take the pill for 2 days in a row, take 2 tablets on the day you remember and 2 tablets on the following day. A backup form of birth control should be used for 7 days after you are back on schedule. ? If you forget to take the pill for 3 days in a row, call your  health care provider for directions on when to restart taking your pills. Do not take the missed pills. A backup form of birth control will be needed for 7 days once you restart your pills. ? If you use a pack that contains inactive pills and you miss 1 or more of the inactive pills, you do not need to take the missed doses. Skip them and start the new pack on the regular day.  For progestin-only OCPs: ? If your dose is 3 hours or more late, or if you miss 1 or more doses, take 1 missed pill as soon as you can. ? If you miss one or more doses, you must use a backup form of birth control. Some brands of progestin-only pills recommend using a  backup form of birth control for 48 hours after a missed or late dose while others recommend 7 days. If you are not sure what to do, call your health care provider or check the patient information sheet that came with your pills.   Follow these instructions at home:  Do not use any products that contain nicotine or tobacco. These include cigarettes, chewing tobacco, or vaping devices, such as e-cigarettes. If you need help quitting, ask your health care provider.  Always use a condom to protect against STIs (sexually transmitted infections). Oral contraception pills do not protect against STIs.  Use a calendar to mark the days of your menstrual period.  Read the information sheet and directions that came with your OCP. Talk to your health care provider if you have questions. Contact a health care provider if:  You develop nausea and vomiting.  You have abnormal vaginal discharge or bleeding.  You develop a rash.  You miss your menstrual period. Depending on the type of OCP you are taking, this may be a sign of pregnancy.  You are losing your hair.  You need treatment for mood swings or depression.  You get dizzy when taking the OCP.  You develop acne after taking the OCP.  You become pregnant or think you may be pregnant.  You have diarrhea,  constipation, and abdominal pain or cramps.  You are not sure what to do after missing pills. Get help right away if:  You develop chest pain.  You develop shortness of breath.  You have an uncontrolled or severe headache.  You develop numbness or slurred speech.  You develop vision or speech problems.  You develop pain, redness, and swelling in your legs.  You develop weakness or numbness in your arms or legs. These symptoms may represent a serious problem that is an emergency. Do not wait to see if the symptoms will go away. Get medical help right away. Call your local emergency services (911 in the [place] Do not drive yourself to the hospital. Summary  Oral contraceptive pills (OCPs) are medicines that you take to prevent pregnancy.  OCPs do not prevent sexually transmitted infections (STIs). Always use a condom to protect against STIs.  When you start an OCP, be aware that it can take 2-3 months for your body to adjust to changes in hormone levels.  Read all the information and directions that come with your OCP. This information is not intended to replace advice given to you by your health care provider. Make sure you discuss any questions you have with your health care provider. Document Revised: 02/16/2020 Document Reviewed: 02/16/2020 Elsevier Patient Education  2021 [employer].

## 2020-07-20 LAB — BETA HCG QUANT (REF LAB): hCG Quant: 6 m[IU]/mL

## 2020-07-26 ENCOUNTER — Telehealth: Payer: Self-pay | Admitting: Clinical

## 2020-07-26 NOTE — Telephone Encounter (Signed)
Follow up call, as agreed-upon by pt and [facility]; pt has [facility]  virtual visit coming up on 08/03/20.

## 2020-08-01 ENCOUNTER — Ambulatory Visit (INDEPENDENT_AMBULATORY_CARE_PROVIDER_SITE_OTHER): Payer: No Payment, Other | Admitting: Professional

## 2020-08-01 ENCOUNTER — Other Ambulatory Visit: Payer: Self-pay

## 2020-08-01 DIAGNOSIS — F331 Major depressive disorder, recurrent, moderate: Secondary | ICD-10-CM

## 2020-08-01 DIAGNOSIS — F411 Generalized anxiety disorder: Secondary | ICD-10-CM
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
## 2020-08-03 ENCOUNTER — Ambulatory Visit (HOSPITAL_COMMUNITY): Payer: Self-pay | Admitting: Professional

## 2020-08-16 ENCOUNTER — Ambulatory Visit (HOSPITAL_COMMUNITY): Payer: No Payment, Other | Admitting: Professional

## 2020-08-23 ENCOUNTER — Ambulatory Visit (HOSPITAL_COMMUNITY): Payer: No Payment, Other | Admitting: Professional

## 2020-08-23 ENCOUNTER — Telehealth (HOSPITAL_COMMUNITY): Payer: Self-pay | Admitting: Professional

## 2020-08-23 ENCOUNTER — Other Ambulatory Visit: Payer: Self-pay

## 2020-08-23 NOTE — Telephone Encounter (Signed)
See call log 

## 2020-09-05 ENCOUNTER — Telehealth (HOSPITAL_COMMUNITY): Payer: No Payment, Other | Admitting: Psychiatry

## 2020-09-06 ENCOUNTER — Encounter (HOSPITAL_COMMUNITY): Payer: Self-pay | Admitting: Psychiatry

## 2020-09-06 ENCOUNTER — Telehealth (INDEPENDENT_AMBULATORY_CARE_PROVIDER_SITE_OTHER): Payer: No Payment, Other | Admitting: Psychiatry

## 2020-09-06 ENCOUNTER — Other Ambulatory Visit: Payer: Self-pay

## 2020-09-06 DIAGNOSIS — F1994 Other psychoactive substance use, unspecified with psychoactive substance-induced mood disorder: Secondary | ICD-10-CM | POA: Diagnosis not present

## 2020-09-06 DIAGNOSIS — F411 Generalized anxiety disorder: Secondary | ICD-10-CM

## 2020-09-06 MED ORDER — BUPROPION HCL ER (XL) 150 MG PO TB24: 150.0000 mg | ORAL_TABLET | ORAL | 2 refills | Status: DC

## 2020-09-06 MED ORDER — TRAZODONE HCL 50 MG PO TABS: 50.0000 mg | ORAL_TABLET | Freq: Every day | ORAL | 2 refills | Status: DC

## 2020-09-06 MED ORDER — HYDROXYZINE HCL 50 MG PO TABS: 50.0000 mg | ORAL_TABLET | Freq: Three times a day (TID) | ORAL | 2 refills | Status: DC | PRN
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
## 2020-11-06 ENCOUNTER — Telehealth (HOSPITAL_COMMUNITY): Payer: No Payment, Other | Admitting: Psychiatry

## 2020-11-06 ENCOUNTER — Other Ambulatory Visit: Payer: Self-pay

## 2020-11-12 ENCOUNTER — Telehealth (INDEPENDENT_AMBULATORY_CARE_PROVIDER_SITE_OTHER): Payer: No Payment, Other | Admitting: Psychiatry

## 2020-11-12 ENCOUNTER — Other Ambulatory Visit: Payer: Self-pay

## 2020-11-12 ENCOUNTER — Encounter (HOSPITAL_COMMUNITY): Payer: Self-pay | Admitting: Psychiatry

## 2020-11-12 DIAGNOSIS — F1994 Other psychoactive substance use, unspecified with psychoactive substance-induced mood disorder: Secondary | ICD-10-CM | POA: Diagnosis not present

## 2020-11-12 DIAGNOSIS — F411 Generalized anxiety disorder: Secondary | ICD-10-CM

## 2020-11-12 MED ORDER — BUPROPION HCL ER (XL) 150 MG PO TB24: 150.0000 mg | ORAL_TABLET | ORAL | 2 refills | Status: DC

## 2020-11-12 MED ORDER — TRAZODONE HCL 100 MG PO TABS: 100.0000 mg | ORAL_TABLET | Freq: Every day | ORAL | 2 refills | Status: DC

## 2020-11-12 MED ORDER — BUSPIRONE HCL 10 MG PO TABS: 10.0000 mg | ORAL_TABLET | Freq: Three times a day (TID) | ORAL | 2 refills | Status: DC

## 2020-11-12 MED ORDER — HYDROXYZINE HCL 50 MG PO TABS: 75.0000 mg | ORAL_TABLET | Freq: Three times a day (TID) | ORAL | 2 refills | Status: DC | PRN

## 2020-11-12 NOTE — Progress Notes (Signed)
BH MD/PA/NP OP Progress Note Virtual Visit via Telephone Note  I connected with Megan Rocha on 11/12/20 at  9:30 AM EDT by telephone and verified that I am speaking with the correct person using two identifiers.  Location: Patient: home Provider: Clinic   I discussed the limitations, risks, security and privacy concerns of performing an evaluation and management service by telephone and the availability of in person appointments. I also discussed with the patient that there may be a patient responsible charge related to this service. The patient expressed understanding and agreed to proceed.   I provided 30 minutes of non-face-to-face time during this encounter.   11/12/2020 10:21 AM Megan Rocha  MRN:  [id number]  Chief Complaint: " I have not started the Wellbutrin"  HPI: 30 year old female seen today for follow up psychiatric evaluation.  She has a psychiatric history of substance-induced mood disorder, anxiety and depression.  She is currently managed on Wellbutrin XL 150 mg, trazodone 50 mg nightly, and hydroxyzine 50 mg 3 times daily.  She notes that her medications are somewhat effective in managing her psychiatric conditions.  Today she was unable to log on virtually so her assessment was done over the phone.  During exam she was pleasant, cooperative, and engaged in conversation.  She informed provider that since her last visit she has not started the Wellbutrin and is still feeling depressed and anxious most days.  Provider conducted a GAD-7 and patient scored a 13, at her last visit she scored an 18.  Provider also conducted a PHQ-9 and patient scored a 13, at her last visit she scored a 17.  She notes her anxiety and depression has somewhat improved because she has started working at a [employer].  She notes that she feels that she can accomplish more things at work and enjoys her position.  She also informed provider that at home she feels like a failure and  reports that her family may be better off without her.  She endorses passive SI however denies wanting to hurt her self.  She denies SI/HI/VAH, mania, or paranoia.  Patient notes that when she is overly sleepy she sees shadows that may not be there.  Patient notes that since starting trazodone her sleep has somewhat improved however notes that she only sleeps 4 hours a night.   Patient informed provider that her marijuana consumption has reduced a little.  She notes that she still smokes daily however reports that it only once a day.  She endorses irritability, racing thoughts, fluctuations in mood, and distractibility. Provider informed patient that marijuana use can impact her mood.  She endorsed understanding.    Today she is agreeable to restarting Wellbutrin XL 150 mg daily to help manage depression.  She notes that hydroxyzine is ineffective however informed writer that she would like her dose increased.  Provider was agreeable and increased hydroxyzine 50 mg 3 times a day to 75 mg 3 times a day.  She is also agreeable to starting BuSpar 10 mg 3 times daily to help manage anxiety and depression.  She is also agreeable to increasing trazodone 50 mg to 100 mg to help manage sleep. Potential side effects of medication and risks vs benefits of treatment vs non-treatment were explained and discussed. All questions were answered.  She will continue all other medications as prescribed.  No other concerns noted at this time.    Visit Diagnosis:    ICD-10-CM   1. Generalized anxiety disorder  F41.1 buPROPion (  WELLBUTRIN XL) 150 MG 24 hr tablet    hydrOXYzine (ATARAX/VISTARIL) 50 MG tablet    busPIRone (BUSPAR) 10 MG tablet  2. Substance induced mood disorder (HCC)  F19.94 buPROPion (WELLBUTRIN XL) 150 MG 24 hr tablet    traZODone (DESYREL) 100 MG tablet    Past Psychiatric History: Anxiety and depression  Past Medical History:  Past Medical History:  Diagnosis Date  . BV (bacterial vaginosis)    . PONV (postoperative nausea and vomiting)    from epidural  . UTI (urinary tract infection)   . Yeast vaginitis     Past Surgical History:  Procedure Laterality Date  . colposcopy    . NO PAST SURGERIES      Family Psychiatric History: Father notes that her has mental health issues  Family History:  Family History  Problem Relation Age of Onset  . Breast cancer Paternal Grandmother     Social History:  Social History   Socioeconomic History  . Marital status: Single    Spouse name: Not on file  . Number of children: Not on file  . Years of education: Not on file  . Highest education level: Not on file  Occupational History  . Not on file  Tobacco Use  . Smoking status: Never Smoker  . Smokeless tobacco: Never Used  Vaping Use  . Vaping Use: Never used  Substance and Sexual Activity  . Alcohol use: Not Currently    Comment: occasional   . Drug use: Not Currently    Types: Marijuana    Comment: used daily until + pregnancy  . Sexual activity: Yes    Birth control/protection: None  Other Topics Concern  . Not on file  Social History Narrative  . Not on file   Social Determinants of Health   Financial Resource Strain: Not on file  Food Insecurity: No Food Insecurity  . Worried About Programme researcher, broadcasting/film/videounning Out of Food in the Last Year: Never true  . Ran Out of Food in the Last Year: Never true  Transportation Needs: No Transportation Needs  . Lack of Transportation (Medical): No  . Lack of Transportation (Non-Medical): No  Physical Activity: Not on file  Stress: Not on file  Social Connections: Not on file    Allergies:  Allergies  Allergen Reactions  . Penicillins Anaphylaxis    Metabolic Disorder Labs: Lab Results  Component Value Date   HGBA1C 5.0 08/17/2019   No results found for: PROLACTIN No results found for: CHOL, TRIG, HDL, CHOLHDL, VLDL, LDLCALC No results found for: TSH  Therapeutic Level Labs: No results found for: LITHIUM No results found  for: VALPROATE No components found for:  CBMZ  Current Medications: Current Outpatient Medications  Medication Sig Dispense Refill  . busPIRone (BUSPAR) 10 MG tablet Take 1 tablet (10 mg total) by mouth 3 (three) times daily. 90 tablet 2  . acetaminophen (TYLENOL) 325 MG tablet Take 2 tablets (650 mg total) by mouth every 4 (four) hours as needed (for pain scale < 4).    . buPROPion (WELLBUTRIN XL) 150 MG 24 hr tablet Take 1 tablet (150 mg total) by mouth every morning. 30 tablet 2  . hydrOXYzine (ATARAX/VISTARIL) 50 MG tablet Take 1.5 tablets (75 mg total) by mouth 3 (three) times daily as needed. 120 tablet 2  . norgestimate-ethinyl estradiol (ORTHO-CYCLEN) 0.25-35 MG-MCG tablet Take 1 tablet by mouth daily. 28 tablet 11  . promethazine (PHENERGAN) 12.5 MG tablet Take 1 tablet (12.5 mg total) by mouth every 6 (six)  hours as needed for nausea or vomiting. (Patient not taking: Reported on 07/19/2020) 30 tablet 0  . traZODone (DESYREL) 100 MG tablet Take 1 tablet (100 mg total) by mouth at bedtime. 30 tablet 2   No current facility-administered medications for this visit.     Musculoskeletal: Strength & Muscle Tone: Unable to assess due to telephone visit Gait & Station: Unable to assess due to telephone visit Patient leans: N/A  Psychiatric Specialty Exam: Review of Systems  Last menstrual period 05/03/2020, unknown if currently breastfeeding.There is no height or weight on file to calculate BMI.  General Appearance: Unable to assess due to telephone visit  Eye Contact:  Unable to assess due to telephone visit  Speech:  Clear and Coherent and Normal Rate  Volume:  Normal  Mood:  Anxious and Depressed  Affect:  Appropriate and Congruent  Thought Process:  Coherent, Goal Directed and Linear  Orientation:  Full (Time, Place, and Person)  Thought Content: WDL and Logical   Suicidal Thoughts:  No  Homicidal Thoughts:  No  Memory:  Immediate;   Good Recent;   Good Remote;   Good   Judgement:  Good  Insight:  Good  Psychomotor Activity:  Unable to assess due to telephone visit  Concentration:  Concentration: Good and Attention Span: Good  Recall:  Good  Fund of Knowledge: Good  Language: Good  Akathisia:  No  Handed:  Right  AIMS (if indicated): Not done  Assets:  Communication Skills Desire for Improvement Financial Resources/Insurance Housing Leisure Time Social Support  ADL's:  Intact  Cognition: WNL  Sleep:  Good   Screenings: GAD-7   Flowsheet Row Video Visit from 11/12/2020 in [facility] Video Visit from 09/06/2020 in [facility] Office Visit from 07/19/2020 in [facility] for Women Routine Prenatal from 02/02/2020 in [facility] at [facility] for Women Routine Prenatal from 01/23/2020 in Center for [facility] for Women  Total GAD-7 Score 13 18 16 15 13     PHQ2-9   Flowsheet Row Video Visit from 11/12/2020 in [facility] Video Visit from 09/06/2020 in [facility] Office Visit from 07/19/2020 in [facility] at [facility] for Women Routine Prenatal from 02/02/2020 in [facility] at [facility] for Women Routine Prenatal from 01/23/2020 in Center for 03/24/2020 Healthcare at [facility] for Women  PHQ-2 Total Score 3 4 5 3 2   PHQ-9 Total Score 13 17 17 14 11     Flowsheet Row Video Visit from 11/12/2020 in [facility] Video Visit from 09/06/2020 in [facility]  C-SSRS RISK CATEGORY Error: Q7 should not be populated when Q6 is No No Risk       Assessment and Plan: Patient endorses symptoms of anxiety, depression, and marijuana use.  Today she is agreeable to starting BuSpar 10 mg to help manage anxiety and depression.  She will  also increase hydroxyzine 50 mg to 75 mg to help manage anxiety.  He will increase trazodone 50 mg nightly to 100 mg nightly as needed to help manage sleep.  She will increase trazodone 50mg  to 75 mg 3 times daily to help manage anxiety.  She will continue all other medications as prescribed.  1. Generalized anxiety disorder  Restart- buPROPion (WELLBUTRIN XL) 150 MG 24 hr tablet; Take 1  tablet (150 mg total) by mouth every morning.  Dispense: 30 tablet; Refill: 2 Increased- hydrOXYzine (ATARAX/VISTARIL) 50 MG tablet; Take 1.5 tablets (75 mg total) by mouth 3 (three) times daily as needed.  Dispense: 120 tablet; Refill: 2 Start- busPIRone (BUSPAR) 10 MG tablet; Take 1 tablet (10 mg total) by mouth 3 (three) times daily.  Dispense: 90 tablet; Refill: 2  2. Substance induced mood disorder (HCC)  Restart- buPROPion (WELLBUTRIN XL) 150 MG 24 hr tablet; Take 1 tablet (150 mg total) by mouth every morning.  Dispense: 30 tablet; Refill: 2 Increased- traZODone (DESYREL) 100 MG tablet; Take 1 tablet (100 mg total) by mouth at bedtime.  Dispense: 30 tablet; Refill: 2  Follow-up in 3 months Follow-up with therapy  Shanna Cisco, NP 11/12/2020, 10:21 AM

## 2020-12-08 ENCOUNTER — Emergency Department (HOSPITAL_BASED_OUTPATIENT_CLINIC_OR_DEPARTMENT_OTHER)
Admission: EM | Admit: 2020-12-08 | Discharge: 2020-12-08 | Disposition: A | Discharge status: Home / Self Care | Payer: Self-pay | Attending: Emergency Medicine | Admitting: Emergency Medicine

## 2020-12-08 ENCOUNTER — Other Ambulatory Visit: Payer: Self-pay

## 2020-12-08 ENCOUNTER — Encounter (HOSPITAL_BASED_OUTPATIENT_CLINIC_OR_DEPARTMENT_OTHER): Payer: Self-pay

## 2020-12-08 DIAGNOSIS — Z8616 Personal history of COVID-19: Secondary | ICD-10-CM | POA: Insufficient documentation

## 2020-12-08 DIAGNOSIS — Z20822 Contact with and (suspected) exposure to covid-19: Secondary | ICD-10-CM | POA: Insufficient documentation

## 2020-12-08 DIAGNOSIS — R509 Fever, unspecified: Secondary | ICD-10-CM | POA: Insufficient documentation

## 2020-12-08 DIAGNOSIS — L509 Urticaria, unspecified: Secondary | ICD-10-CM | POA: Insufficient documentation

## 2020-12-08 DIAGNOSIS — R059 Cough, unspecified: Secondary | ICD-10-CM | POA: Insufficient documentation

## 2020-12-08 DIAGNOSIS — Z28310 Unvaccinated for covid-19: Secondary | ICD-10-CM | POA: Insufficient documentation

## 2020-12-08 LAB — BASIC METABOLIC PANEL
Anion gap: 5 (ref 5–15)
BUN: 10 mg/dL (ref 6–20)
CO2: 28 mmol/L (ref 22–32)
Calcium: 9 mg/dL (ref 8.9–10.3)
Chloride: 104 mmol/L (ref 98–111)
Creatinine, Ser: 0.74 mg/dL (ref 0.44–1.00)
GFR, Estimated: >60 mL/min (ref >60)
Glucose, Bld: 90 mg/dL (ref 70–99)
Potassium: 4.6 mmol/L (ref 3.5–5.1)
Sodium: 137 mmol/L (ref 135–145)

## 2020-12-08 LAB — PREGNANCY, URINE: Preg Test, Ur: NEGATIVE

## 2020-12-08 LAB — CBC
HCT: 38 % (ref 36.0–46.0)
Hemoglobin: 12.6 g/dL (ref 12.0–15.0)
MCH: 29.7 pg (ref 26.0–34.0)
MCHC: 33.2 g/dL (ref 30.0–36.0)
MCV: 89.6 fL (ref 80.0–100.0)
Platelets: 171 10*3/uL (ref 150–400)
RBC: 4.24 MIL/uL (ref 3.87–5.11)
RDW: 12.8 % (ref 11.5–15.5)
WBC: 5.4 10*3/uL (ref 4.0–10.5)
nRBC: 0 % (ref 0.0–0.2)

## 2020-12-08 LAB — RESP PANEL BY RT-PCR (FLU A&B, COVID) ARPGX2
Influenza A by PCR: NEGATIVE
Influenza B by PCR: NEGATIVE
SARS Coronavirus 2 by RT PCR: NEGATIVE

## 2020-12-08 MED ORDER — DEXAMETHASONE SODIUM PHOSPHATE 10 MG/ML IJ SOLN
8.0000 mg | Freq: Once | INTRAMUSCULAR | Status: AC
Start: 2020-12-08 — End: 2020-12-08
  Administered 2020-12-08: 8 mg via INTRAVENOUS
  Filled 2020-12-08: qty 1

## 2020-12-08 MED ORDER — SODIUM CHLORIDE 0.9 % IV SOLN: INTRAVENOUS | Status: DC | PRN

## 2020-12-08 MED ORDER — DIPHENHYDRAMINE HCL 50 MG/ML IJ SOLN
25.0000 mg | Freq: Once | INTRAMUSCULAR | Status: AC
  Administered 2020-12-08: 15:00:00 25 mg via INTRAVENOUS
  Filled 2020-12-08: qty 1

## 2020-12-08 MED ORDER — FAMOTIDINE IN NACL 20-0.9 MG/50ML-% IV SOLN
20.0000 mg | Freq: Once | INTRAVENOUS | Status: AC
  Administered 2020-12-08: 20 mg via INTRAVENOUS
  Filled 2020-12-08: qty 50

## 2020-12-08 NOTE — ED Provider Notes (Addendum)
[facility] EMERGENCY DEPARTMENT Provider Note   CSN: [id number] Arrival date & time: 12/08/20  1425     History Chief Complaint  Patient presents with   Urticaria    Megan Rocha is a 30 y.o. female with no pertinent past medical history that presents to the emergency department today for urticaria.  Patient states the urticaria started on Wednesday, states that it was present all over her body, states that she took some Benadryl and used hydrocortisone cream with some improvement however the hives are continuing.  Hives were present on face, breast, torso, abdomen and back and lower extremities.  Today states that they were only present on abdomen.   States that they are very itchy, nonpainful.  Patient states that hives appear and then will disappear since Wednesday evening.  Denies any triggers.  Denies any new soaps, detergents, lotions or fragrances.  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  Patient states that she has getting over recent cold, however no one else in her family is experiencing similar symptoms of urticaria.  Denies any new medications.Patient denies any shortness of breath or chest pain.  Patient denies any airway compromise, states that she never felt as if her throat was closing.  Denies any diarrhea or nausea.  Denies any difficulty swallowing or sore throat.  States that she did have a cough and a fever on Wednesday night when this started.  Has not been vaccinated against COVID.  Denies any sick contacts except for some children that she is with that are constantly sick.  Denies any pain anywhere, no other complaints at this time.  Patient states this is never happened to her before.  HPI     Past Medical History:  Diagnosis Date   BV (bacterial vaginosis)    PONV (postoperative nausea and vomiting)    from epidural   UTI (urinary tract infection)    Yeast vaginitis     Patient Active Problem List   Diagnosis  Date Noted   Substance induced mood disorder (HCC) 09/06/2020   Major depressive disorder, recurrent episode, moderate (HCC) 08/01/2020   Generalized anxiety disorder 08/01/2020   SAB (spontaneous abortion) 07/19/2020   Post-dates pregnancy 02/09/2020   Vaginal delivery 02/09/2020   Depression affecting pregnancy in third trimester, antepartum 12/14/2019   Gestational thrombocytopenia (HCC) 11/17/2019   ASCUS with positive high risk HPV cervical 10/18/2019   Fetal cardiac echogenic focus 09/13/2019    Past Surgical History:  Procedure Laterality Date   colposcopy     NO PAST SURGERIES       OB History     Gravida  3   Para  2   Term  2   Preterm      AB      Living  2      SAB      IAB      Ectopic      Multiple  0   Live Births  2           Family History  Problem Relation Age of Onset   Breast cancer Paternal Grandmother     Social History   Tobacco Use   Smoking status: Never   Smokeless tobacco: Never  Vaping Use   Vaping Use: Never used  Substance Use Topics   Alcohol use: Not Currently    Comment: occasional    Drug use: Not Currently    Types: Marijuana  Comment: used daily until + pregnancy    Home Medications Prior to Admission medications   Medication Sig Start Date End Date Taking? Authorizing Provider  acetaminophen (TYLENOL) 325 MG tablet Take 2 tablets (650 mg total) by mouth every 4 (four) hours as needed (for pain scale < 4). 02/11/20   Gita Kudo, MD  buPROPion (WELLBUTRIN XL) 150 MG 24 hr tablet Take 1 tablet (150 mg total) by mouth every morning. 11/12/20   Shanna Cisco, NP  busPIRone (BUSPAR) 10 MG tablet Take 1 tablet (10 mg total) by mouth 3 (three) times daily. 11/12/20   Shanna Cisco, NP  hydrOXYzine (ATARAX/VISTARIL) 50 MG tablet Take 1.5 tablets (75 mg total) by mouth 3 (three) times daily as needed. 11/12/20   Shanna Cisco, NP  norgestimate-ethinyl estradiol (ORTHO-CYCLEN) 0.25-35 MG-MCG  tablet Take 1 tablet by mouth daily. 07/19/20   Warden Fillers, MD  promethazine (PHENERGAN) 12.5 MG tablet Take 1 tablet (12.5 mg total) by mouth every 6 (six) hours as needed for nausea or vomiting. Patient not taking: Reported on 07/19/2020 07/02/20   Calvert Cantor, CNM  traZODone (DESYREL) 100 MG tablet Take 1 tablet (100 mg total) by mouth at bedtime. 11/12/20   Shanna Cisco, NP    Allergies    Penicillins  Review of Systems   Review of Systems  Constitutional:  Negative for chills, diaphoresis, fatigue and fever.  HENT:  Negative for congestion, sore throat and trouble swallowing.   Eyes:  Negative for pain and visual disturbance.  Respiratory:  Negative for cough, shortness of breath and wheezing.   Cardiovascular:  Negative for chest pain, palpitations and leg swelling.  Gastrointestinal:  Negative for abdominal distention, abdominal pain, diarrhea, nausea and vomiting.  Genitourinary:  Negative for difficulty urinating.  Musculoskeletal:  Negative for back pain, neck pain and neck stiffness.  Skin:  Positive for rash. Negative for pallor.  Neurological:  Negative for dizziness, speech difficulty, weakness and headaches.  Psychiatric/Behavioral:  Negative for confusion.    Physical Exam Updated Vital Signs BP 105/72   Pulse 60   Temp 98.7 F (37.1 C) (Oral)   Resp 16   Ht 5\' 10"  (1.778 m)   Wt 65.8 kg   LMP 05/03/2020   SpO2 100%   BMI 20.81 kg/m   Physical Exam Constitutional:      General: She is not in acute distress.    Appearance: Normal appearance. She is not ill-appearing, toxic-appearing or diaphoretic.     Comments: Well-appearing, no airway compromise.  HENT:     Mouth/Throat:     Mouth: Mucous membranes are moist.     Pharynx: Oropharynx is clear.     Comments: No angioedema. Eyes:     General: No scleral icterus.    Extraocular Movements: Extraocular movements intact.     Pupils: Pupils are equal, round, and reactive to light.   Cardiovascular:     Rate and Rhythm: Normal rate and regular rhythm.     Pulses: Normal pulses.     Heart sounds: Normal heart sounds.  Pulmonary:     Effort: Pulmonary effort is normal. No respiratory distress.     Breath sounds: Normal breath sounds. No stridor. No wheezing, rhonchi or rales.  Chest:     Chest wall: No tenderness.  Abdominal:     General: Abdomen is flat. There is no distension.     Palpations: Abdomen is soft.     Tenderness: There is no abdominal tenderness.  There is no guarding or rebound.  Musculoskeletal:        General: No swelling or tenderness. Normal range of motion.     Cervical back: Normal range of motion and neck supple. No rigidity.     Right lower leg: No edema.     Left lower leg: No edema.  Skin:    General: Skin is warm and dry.     Capillary Refill: Capillary refill takes less than 2 seconds.     Coloration: Skin is not pale.     Findings: Rash present.     Comments: Patient does have very few spots of urticaria.  1 spot on upper thigh on left side, couple spots on abdomen and torso.  No lesions on face, palms or soles.  Neurological:     General: No focal deficit present.     Mental Status: She is alert and oriented to person, place, and time.  Psychiatric:        Mood and Affect: Mood normal.        Behavior: Behavior normal.    ED Results / Procedures / Treatments   Labs (all labs ordered are listed, but only abnormal results are displayed) Labs Reviewed  RESP PANEL BY RT-PCR (FLU A&B, COVID) ARPGX2  PREGNANCY, URINE  CBC  BASIC METABOLIC PANEL    EKG None  Radiology No results found.  Procedures Procedures   Medications Ordered in ED Medications  0.9 %  sodium chloride infusion ( Intravenous New Bag/Given 12/08/20 1512)  diphenhydrAMINE (BENADRYL) injection 25 mg (25 mg Intravenous Given 12/08/20 1512)  famotidine (PEPCID) IVPB 20 mg premix (0 mg Intravenous Stopped 12/08/20 1622)  dexamethasone (DECADRON) injection 8 mg  (8 mg Intravenous Given 12/08/20 1513)    ED Course  I have reviewed the triage vital signs and the nursing notes.  Pertinent labs & imaging results that were available during my care of the patient were reviewed by me and considered in my medical decision making (see chart for details).    MDM Rules/Calculators/A&P                          Orson EvaJeanine Coulibaly is a 30 y.o. female with no pertinent past medical history that presents to the emergency department today for urticaria.  Patient is nontoxic-appearing, vitals are stable.  Patient does have some very mild spots of urticaria on abdomen and thigh.  Patient did show me pictures of urticaria and they were throughout her body, appears that this is getting better however patient feels as if the spots are coming back since this is how symptoms started on Wednesday.  No concern for Stevens-Johnson's, anaphylaxis or other emergent rash.  Patient without airway, eyes, appears very well.  We will treat with IV medication, Decadron, Benadryl and Pepcid.  Also refer for allergy testing at this time, however did discuss that patient most likely has the urticaria from postviral syndrome.  Also test for COVID today.  Work-up today reassuring, upon reevaluation urticaria has completely resolved.  Patient states that she feels much better, symptomatic treatment discussed, patient to follow-up with allergy center.  Patient agreeable with plan.  Doubt need for further emergent work up at this time. I explained the diagnosis and have given explicit precautions to return to the ER including for any other new or worsening symptoms. The patient understands and accepts the medical plan as it's been dictated and I have answered their questions. Discharge instructions concerning home  care and prescriptions have been given. The patient is STABLE and is discharged to home in good condition.     Final Clinical Impression(s) / ED Diagnoses Final diagnoses:  Urticaria     Rx / DC Orders ED Discharge Orders     None         Farrel Gordon, PA-C 12/08/20 1648    Virgina Norfolk, DO 12/09/20 [phone]

## 2020-12-08 NOTE — ED Triage Notes (Addendum)
Starting Wednesday observed hives all over body, took benadryl, some improvement, but continuing.  Last took benadryl last night. Does not recall what could have started the hives.  No airway compromise, denies shortness of breath

## 2020-12-08 NOTE — Discharge Instructions (Addendum)
  You were evaluated in the Emergency Department and after careful evaluation, we did not find any emergent condition requiring admission or further testing in the hospital.   Your exam/testing today was overall reassuring.  Symptoms seem to be due to postviral syndrome, however this also could be due to multiple other reasons.  Please use the attached instructions and follow-up with the allergy clinic as we discussed.  You can take Zyrtec or Benadryl as prescribed on the bottle daily until symptoms resolve.  If you develop shortness of breath or any new worsening concerning symptoms he is coming to the ER. Please return to the Emergency Department if you experience any worsening of your condition.  Thank you for allowing [place] to be a part of your care. Please speak to your pharmacist about any new medications prescribed today in regards to side effects or interactions with other medications.

## 2021-02-12 ENCOUNTER — Telehealth (INDEPENDENT_AMBULATORY_CARE_PROVIDER_SITE_OTHER): Payer: No Payment, Other | Admitting: Psychiatry

## 2021-02-12 ENCOUNTER — Other Ambulatory Visit: Payer: Self-pay

## 2021-02-12 ENCOUNTER — Encounter (HOSPITAL_COMMUNITY): Payer: Self-pay | Admitting: Psychiatry

## 2021-02-12 DIAGNOSIS — F1994 Other psychoactive substance use, unspecified with psychoactive substance-induced mood disorder: Secondary | ICD-10-CM | POA: Diagnosis not present

## 2021-02-12 DIAGNOSIS — F411 Generalized anxiety disorder: Secondary | ICD-10-CM

## 2021-02-12 MED ORDER — BUSPIRONE HCL 10 MG PO TABS: 10.0000 mg | ORAL_TABLET | Freq: Three times a day (TID) | ORAL | 2 refills | Status: DC

## 2021-02-12 MED ORDER — ARIPIPRAZOLE 5 MG PO TABS: 5.0000 mg | ORAL_TABLET | Freq: Every day | ORAL | 2 refills | Status: DC

## 2021-02-12 MED ORDER — TRAZODONE HCL 100 MG PO TABS: 100.0000 mg | ORAL_TABLET | Freq: Every day | ORAL | 2 refills | Status: DC

## 2021-02-12 MED ORDER — BUPROPION HCL ER (XL) 150 MG PO TB24: 150.0000 mg | ORAL_TABLET | ORAL | 2 refills | Status: DC

## 2021-02-12 NOTE — Progress Notes (Signed)
BH MD/PA/NP OP Progress Note Virtual Visit via Video Note  I connected with Megan Rocha on 02/12/21 at 10:00 AM EDT by a video enabled telemedicine application and verified that I am speaking with the correct person using two identifiers.  Location: Patient: Home Provider: Clinic   I discussed the limitations of evaluation and management by telemedicine and the availability of in person appointments. The patient expressed understanding and agreed to proceed.  I provided 30 minutes of non-face-to-face time during this encounter.    02/12/2021 11:24 AM Megan Rocha  MRN:  [id number]  Chief Complaint: " I get really agitated and been having mood swings"  HPI: 30 year old female seen today for follow up psychiatric evaluation.  She has a psychiatric history of substance-induced mood disorder, anxiety and depression.  She is currently managed on Wellbutrin XL 150 mg, trazodone 100 mg nightly, BuSpar 10 mg 3 times daily, and hydroxyzine 75 mg 3 times daily.  She notes that her medications are somewhat effective in managing her psychiatric conditions.  She notes that she dislikes hydroxyzine and would like to discontinue it   Today she is well-groomed, pleasant, cooperative, engaged in conversation, and maintains eye contact.  She informed Clinical research associate that since her last visit she feels like her anxiety and her depression has somewhat improved.  She however notes that she becomes agitated with her fianc and her parents.  She also notes that she has been having mood swings.  She endorses symptoms of hypomania such as fluctuations in mood, irritability, distractibility, racing thoughts, and impulsive spending (noting that she is buys clothing).  Patient notes that this past Monday she celebrated her 74-year-old birthday party.  She notes that for months prior to this she was very nervous because her daughter's birthday was 2 days before her deceased child's due date.  She informed Clinical research associate that she had a  miscarriage in January and still feels down about it at times.  Provider was empathetic and endorsed understanding.  Patient notes that since her daughter's birthday her spirit has been lifted.  Today provider conducted a GAD-7 and patient scored a 12, at her last visit she scored a 13.  Provider also conducted a PHQ-9 and patient scored a 13, at her last visit she scored a 13.  Today she denies SI/HI/VAH or paranoia.   Patient informed provider that she continues to smoke marijuana daily.  She notes that it is her routine like drinking coffee.  She informed Clinical research associate that she feels that it gives her energy.  Provider informed patient that marijuana could be exacerbating some of her hypomanic symptoms.  She endorsed understanding and notes that she would try to reduce it.  Today she is agreeable to starting Abilify 5 mg to help manage mood. Potential side effects of medication and risks vs benefits of treatment vs non-treatment were explained and discussed. All questions were answered. She will continue all other medications as prescribed.  No other concerns noted at this time.       Visit Diagnosis:    ICD-10-CM   1. Generalized anxiety disorder  F41.1 busPIRone (BUSPAR) 10 MG tablet    buPROPion (WELLBUTRIN XL) 150 MG 24 hr tablet    2. Substance induced mood disorder (HCC)  F19.94 buPROPion (WELLBUTRIN XL) 150 MG 24 hr tablet    traZODone (DESYREL) 100 MG tablet    ARIPiprazole (ABILIFY) 5 MG tablet       Past Psychiatric History: Anxiety and depression  Past Medical History:  Past Medical History:  Diagnosis Date   BV (bacterial vaginosis)    PONV (postoperative nausea and vomiting)    from epidural   UTI (urinary tract infection)    Yeast vaginitis     Past Surgical History:  Procedure Laterality Date   colposcopy     NO PAST SURGERIES      Family Psychiatric History: Father notes that her has mental health issues  Family History:  Family History  Problem Relation Age of  Onset   Breast cancer Paternal Grandmother     Social History:  Social History   Socioeconomic History   Marital status: Single    Spouse name: Not on file   Number of children: Not on file   Years of education: Not on file   Highest education level: Not on file  Occupational History   Not on file  Tobacco Use   Smoking status: Never   Smokeless tobacco: Never  Vaping Use   Vaping Use: Never used  Substance and Sexual Activity   Alcohol use: Not Currently    Comment: occasional    Drug use: Not Currently    Types: Marijuana    Comment: used daily until + pregnancy   Sexual activity: Yes    Birth control/protection: None  Other Topics Concern   Not on file  Social History Narrative   Not on file   Social Determinants of Health   Financial Resource Strain: Not on file  Food Insecurity: Not on file  Transportation Needs: Not on file  Physical Activity: Not on file  Stress: Not on file  Social Connections: Not on file    Allergies:  Allergies  Allergen Reactions   Penicillins Anaphylaxis    Metabolic Disorder Labs: Lab Results  Component Value Date   HGBA1C 5.0 08/17/2019   No results found for: PROLACTIN No results found for: CHOL, TRIG, HDL, CHOLHDL, VLDL, LDLCALC No results found for: TSH  Therapeutic Level Labs: No results found for: LITHIUM No results found for: VALPROATE No components found for:  CBMZ  Current Medications: Current Outpatient Medications  Medication Sig Dispense Refill   ARIPiprazole (ABILIFY) 5 MG tablet Take 1 tablet (5 mg total) by mouth daily. 30 tablet 2   acetaminophen (TYLENOL) 325 MG tablet Take 2 tablets (650 mg total) by mouth every 4 (four) hours as needed (for pain scale < 4).     buPROPion (WELLBUTRIN XL) 150 MG 24 hr tablet Take 1 tablet (150 mg total) by mouth every morning. 30 tablet 2   busPIRone (BUSPAR) 10 MG tablet Take 1 tablet (10 mg total) by mouth 3 (three) times daily. 90 tablet 2   norgestimate-ethinyl  estradiol (ORTHO-CYCLEN) 0.25-35 MG-MCG tablet Take 1 tablet by mouth daily. 28 tablet 11   promethazine (PHENERGAN) 12.5 MG tablet Take 1 tablet (12.5 mg total) by mouth every 6 (six) hours as needed for nausea or vomiting. (Patient not taking: Reported on 07/19/2020) 30 tablet 0   traZODone (DESYREL) 100 MG tablet Take 1 tablet (100 mg total) by mouth at bedtime. 30 tablet 2   No current facility-administered medications for this visit.     Musculoskeletal: Strength & Muscle Tone:  Unable to assess due to telehealth visit Gait & Station:  Unable to assess due to telehealth visit Patient leans: N/A  Psychiatric Specialty Exam: Review of Systems  Last menstrual period 05/03/2020, unknown if currently breastfeeding.There is no height or weight on file to calculate BMI.  General Appearance: Well Groomed  Eye Contact:  Good  Speech:  Clear and Coherent and Normal Rate  Volume:  Normal  Mood:  Anxious and Depressed  Affect:  Appropriate and Congruent  Thought Process:  Coherent, Goal Directed and Linear  Orientation:  Full (Time, Place, and Person)  Thought Content: WDL and Logical   Suicidal Thoughts:  No  Homicidal Thoughts:  No  Memory:  Immediate;   Good Recent;   Good Remote;   Good  Judgement:  Good  Insight:  Good  Psychomotor Activity:  Normal  Concentration:  Concentration: Good and Attention Span: Good  Recall:  Good  Fund of Knowledge: Good  Language: Good  Akathisia:  No  Handed:  Right  AIMS (if indicated): Not done  Assets:  Communication Skills Desire for Improvement Financial Resources/Insurance Housing Leisure Time Social Support  ADL's:  Intact  Cognition: WNL  Sleep:  Good   Screenings: GAD-7    Flowsheet Row Video Visit from 02/12/2021 in [facility] Video Visit from 11/12/2020 in [facility] Video Visit from 09/06/2020 in [facility] Office Visit [facility]2020 [facility] Routine Prenatal from 02/02/2020 in [facility]  Total GAD-7 Score 12 13 18 16 15       PHQ2-9    Flowsheet Row Video Visit from 02/12/2021 in [facility] Video Visit from 11/12/2020 in [facility] Video Visit from 09/06/2020 in [facility] Office Visit from 07/19/2020 in [facility] Routine Prenatal from 02/02/2020 in [facility]  PHQ-2 Total Score 4 3 4 5 3   PHQ-9 Total Score 13 13 17 17 14       Flowsheet Row ED from 12/08/2020 in [facility] [place] [facility] Video Visit from 11/12/2020 in [facility] Video Visit from 09/06/2020 in [facility]  C-SSRS RISK CATEGORY No Risk Error: Q7 should not be populated when Q6 is No No Risk        Assessment and Plan: Patient endorses symptoms of anxiety, depression, hypomania, and marijuana use.  Today she is agreeable to starting Abilify 5 mg to help mood.  She will continue all other medications as prescribed.  1. Generalized anxiety disorder  Continue- busPIRone (BUSPAR) 10 MG tablet; Take 1 tablet (10 mg total) by mouth 3 (three) times daily.  Dispense: 90 tablet; Refill: 2 Continue- traZODone (DESYREL) 100 MG tablet; Take 1 tablet (100 mg total) by mouth at bedtime.  Dispense: 30 tablet; Refill: 2  2. Substance induced mood disorder (HCC)  Continue- buPROPion (WELLBUTRIN XL) 150 MG 24 hr tablet; Take 1 tablet (150 mg total) by mouth every morning.  Dispense: 30 tablet; Refill: 2 Continue- traZODone (DESYREL) 100 MG tablet; Take 1 tablet (100 mg total) by mouth at bedtime.  Dispense: 30 tablet; Refill: 2 Start- ARIPiprazole (ABILIFY) 5 MG tablet; Take 1 tablet (5 mg total) by  mouth daily.  Dispense: 30 tablet; Refill: 2  Follow-up in 3 months Follow-up with therapy  [facility], NP 02/12/2021, 11:24 AM

## 2021-03-18 LAB — PREGNANCY, URINE: Preg Test, Ur: POSITIVE

## 2021-04-19 ENCOUNTER — Encounter (HOSPITAL_COMMUNITY): Payer: Self-pay | Admitting: Psychiatry

## 2021-04-19 ENCOUNTER — Telehealth (INDEPENDENT_AMBULATORY_CARE_PROVIDER_SITE_OTHER): Payer: No Payment, Other | Admitting: Psychiatry

## 2021-04-19 DIAGNOSIS — F32A Depression, unspecified: Secondary | ICD-10-CM | POA: Diagnosis not present

## 2021-04-19 DIAGNOSIS — F411 Generalized anxiety disorder: Secondary | ICD-10-CM | POA: Diagnosis not present

## 2021-04-19 NOTE — Progress Notes (Signed)
BH MD/PA/NP OP Progress Note Virtual Visit via Video Note  I connected with Megan Rocha on 04/19/21 at 10:30 AM EDT by a video enabled telemedicine application and verified that I am speaking with the correct person using two identifiers.  Location: Patient: Home Provider: Clinic   I discussed the limitations of evaluation and management by telemedicine and the availability of in person appointments. The patient expressed understanding and agreed to proceed.  I provided 30 minutes of non-face-to-face time during this encounter.    04/19/2021 11:03 AM Megan Rocha  MRN:  [id number]  Chief Complaint: " I recently found out I was pregnant"  HPI: 30 year old female seen today for follow up psychiatric evaluation.  She has a psychiatric history of substance-induced mood disorder, anxiety and depression.  She is currently managed on Wellbutrin XL 150 mg, trazodone 100 mg nightly,Abilify 5 mg daily, and BuSpar 10 mg 3 times daily.  She notes that she discontinued her    Today she is well-groomed, pleasant, cooperative, engaged in conversation, and maintains eye contact.  She informed Clinical research associate that since discontinuing her medication her emotions are everywhere. She notes that she is excited about her pregnancy but also nervous. She reports that at times she still think about the child she miscarried. She reports her 91 year old daughter and son are doing well. At this time she notes that she does not want to restart medications due to potential risk to her unborn child.   Patient notes that since her last visit she has not experienced symptoms of mania. She also reports that she and her fiance as well as her parents are getting along better which has positively effected her mental health. Today provider conducted a GAD-7 and patient scored a 11, at her last visit she scored a 12.  Provider also conducted a PHQ-9 and patient scored a 11, at her last visit she scored a 13.  Today she denies SI/HI/VAH  or paranoia.   Patient informed provider that she discontinues her marijuana use and reports she now feels that she has nothing to utilize to cope. Provider referred patient to outpatient counseling for therapy.  At this time medications not restarted. Patient will follow up in 3 months for further evaluation. No other concerns noted at this time.      Visit Diagnosis:    ICD-10-CM   1. Generalized anxiety disorder  F41.1 Ambulatory referral to Social Work    2. Mild depression  F32.A Ambulatory referral to Social Work        Past Psychiatric History: Anxiety and depression  Past Medical History:  Past Medical History:  Diagnosis Date   BV (bacterial vaginosis)    PONV (postoperative nausea and vomiting)    from epidural   UTI (urinary tract infection)    Yeast vaginitis     Past Surgical History:  Procedure Laterality Date   colposcopy     NO PAST SURGERIES      Family Psychiatric History: Father notes that her has mental health issues  Family History:  Family History  Problem Relation Age of Onset   Breast cancer Paternal Grandmother     Social History:  Social History   Socioeconomic History   Marital status: Single    Spouse name: Not on file   Number of children: Not on file   Years of education: Not on file   Highest education level: Not on file  Occupational History   Not on file  Tobacco Use   Smoking  status: Never   Smokeless tobacco: Never  Vaping Use   Vaping Use: Never used  Substance and Sexual Activity   Alcohol use: Not Currently    Comment: occasional    Drug use: Not Currently    Types: Marijuana    Comment: used daily until + pregnancy   Sexual activity: Yes    Birth control/protection: None  Other Topics Concern   Not on file  Social History Narrative   Not on file   Social Determinants of Health   Financial Resource Strain: Not on file  Food Insecurity: Not on file  Transportation Needs: Not on file  Physical Activity:  Not on file  Stress: Not on file  Social Connections: Not on file    Allergies:  Allergies  Allergen Reactions   Penicillins Anaphylaxis    Metabolic Disorder Labs: Lab Results  Component Value Date   HGBA1C 5.0 08/17/2019   No results found for: PROLACTIN No results found for: CHOL, TRIG, HDL, CHOLHDL, VLDL, LDLCALC No results found for: TSH  Therapeutic Level Labs: No results found for: LITHIUM No results found for: VALPROATE No components found for:  CBMZ  Current Medications: Current Outpatient Medications  Medication Sig Dispense Refill   acetaminophen (TYLENOL) 325 MG tablet Take 2 tablets (650 mg total) by mouth every 4 (four) hours as needed (for pain scale < 4).     ARIPiprazole (ABILIFY) 5 MG tablet Take 1 tablet (5 mg total) by mouth daily. 30 tablet 2   buPROPion (WELLBUTRIN XL) 150 MG 24 hr tablet Take 1 tablet (150 mg total) by mouth every morning. 30 tablet 2   busPIRone (BUSPAR) 10 MG tablet Take 1 tablet (10 mg total) by mouth 3 (three) times daily. 90 tablet 2   norgestimate-ethinyl estradiol (ORTHO-CYCLEN) 0.25-35 MG-MCG tablet Take 1 tablet by mouth daily. 28 tablet 11   promethazine (PHENERGAN) 12.5 MG tablet Take 1 tablet (12.5 mg total) by mouth every 6 (six) hours as needed for nausea or vomiting. (Patient not taking: Reported on 07/19/2020) 30 tablet 0   traZODone (DESYREL) 100 MG tablet Take 1 tablet (100 mg total) by mouth at bedtime. 30 tablet 2   No current facility-administered medications for this visit.     Musculoskeletal: Strength & Muscle Tone:  Unable to assess due to telehealth visit Gait & Station:  Unable to assess due to telehealth visit Patient leans: N/A  Psychiatric Specialty Exam: Review of Systems  Last menstrual period 05/03/2020, unknown if currently breastfeeding.There is no height or weight on file to calculate BMI.  General Appearance: Well Groomed  Eye Contact:  Good  Speech:  Clear and Coherent and Normal Rate   Volume:  Normal  Mood:  Anxious and Depressed  Affect:  Appropriate and Congruent  Thought Process:  Coherent, Goal Directed and Linear  Orientation:  Full (Time, Place, and Person)  Thought Content: WDL and Logical   Suicidal Thoughts:  No  Homicidal Thoughts:  No  Memory:  Immediate;   Good Recent;   Good Remote;   Good  Judgement:  Good  Insight:  Good  Psychomotor Activity:  Normal  Concentration:  Concentration: Good and Attention Span: Good  Recall:  Good  Fund of Knowledge: Good  Language: Good  Akathisia:  No  Handed:  Right  AIMS (if indicated): Not done  Assets:  Communication Skills Desire for Improvement Financial Resources/Insurance Housing Leisure Time Social Support  ADL's:  Intact  Cognition: WNL  Sleep:  Good   Screenings:  GAD-7    Flowsheet Row Video Visit from [facility]/19/2021 in [facility] Video Visit from 02/12/2021 in [facility] Video Visit from 11/12/2020 in [facility] Video Visit from 09/06/2020 in [facility] Visit from 07/19/[facility]  Total GAD-7 Score 11 12 13 18 16       PHQ2-9    Flowsheet Row Video Visit from 04/19/2021 in [facility] Video Visit from 02/12/2021 in [facility] Video Visit from 11/12/2020 in [facility] Video Visit from 09/06/2020 in [facility] Office Visit from 07/19/2020 in [facility]  PHQ-2 Total Score 3 4 3 4 5   PHQ-9 Total Score 11 13 13 17 17       Flowsheet Row ED from 12/08/2020 in [facility] [facility] [facility] Video Visit from 11/12/2020 in [facility] Video Visit from 09/06/2020 in [facility]  C-SSRS RISK  CATEGORY No Risk Error: Q7 should not be populated when Q6 is No No Risk        Assessment and Plan: Patient endorses symptoms of mild anxiety/depression and depression. At this time medications not restarted due to potential risk to her unborn child. She was referred to outpatient counseling for therapy.  Patient will follow up in 3 months for further evaluation.  1. Generalized anxiety disorder  - Ambulatory referral to Social Work  2. Mild depression  - Ambulatory referral to Social Work   Follow-up in 3 months Follow-up with therapy  [facility], NP 04/19/2021, 11:03 AM

## 2021-05-20 ENCOUNTER — Encounter: Payer: Self-pay | Admitting: *Deleted

## 2021-06-12 ENCOUNTER — Ambulatory Visit (INDEPENDENT_AMBULATORY_CARE_PROVIDER_SITE_OTHER): Payer: Medicaid Other | Admitting: Family Medicine

## 2021-06-12 ENCOUNTER — Encounter: Payer: Self-pay | Admitting: Family Medicine

## 2021-06-12 ENCOUNTER — Other Ambulatory Visit: Payer: Self-pay

## 2021-06-12 VITALS — BP 115/76 | HR 94 | Wt 144.9 lb

## 2021-06-12 DIAGNOSIS — F32A Depression, unspecified: Secondary | ICD-10-CM

## 2021-06-12 DIAGNOSIS — F411 Generalized anxiety disorder: Secondary | ICD-10-CM

## 2021-06-12 DIAGNOSIS — Z3492 Encounter for supervision of normal pregnancy, unspecified, second trimester: Secondary | ICD-10-CM | POA: Insufficient documentation

## 2021-06-12 DIAGNOSIS — R87610 Atypical squamous cells of undetermined significance on cytologic smear of cervix (ASC-US): Secondary | ICD-10-CM

## 2021-06-12 DIAGNOSIS — R87810 Cervical high risk human papillomavirus (HPV) DNA test positive: Secondary | ICD-10-CM

## 2021-06-12 MED ORDER — PRENATAL 27-1 MG PO TABS: 1.0000 | ORAL_TABLET | Freq: Every day | ORAL | 12 refills | Status: DC

## 2021-06-12 NOTE — Progress Notes (Signed)
Subjective:   Megan Rocha is a 30 y.o. [id number] at [redacted]w[redacted]d by LMP being seen today for her first obstetrical visit.  Her obstetrical history is significant for  n/a . Patient does intend to breast feed. Pregnancy history fully reviewed.  Patient reports no complaints.  HISTORY: OB History  Gravida Para Term Preterm AB Living  4 2 2  0 1 2  SAB IAB Ectopic Multiple Live Births  1 0 0 0 2    # Outcome Date GA Lbr Len/2nd Weight Sex Delivery Anes PTL Lv  4 Current           3 SAB 2022          2 Term 02/09/20 [redacted]w[redacted]d 04:45 / 00:07 6 lb 9.1 oz (2.98 kg) F Vag-Spont Local  LIV     Birth Comments: WDL      Name: [redacted]w[redacted]d     Apgar1: 8  Apgar5: 8  1 Term 2015 [redacted]w[redacted]d  7 lb 6 oz (3.345 kg) M Vag-Spont EPI  LIV     Birth Comments: wnl     Last pap smear: Lab Results  Component Value Date   DIAGPAP (A) 08/17/2019    - Atypical squamous cells of undetermined significance (ASC-US)   HPVHIGH Positive (A) 08/17/2019     Past Medical History:  Diagnosis Date   BV (bacterial vaginosis)    Medical history non-contributory    PONV (postoperative nausea and vomiting)    from epidural   UTI (urinary tract infection)    Yeast vaginitis    Past Surgical History:  Procedure Laterality Date   colposcopy     NO PAST SURGERIES     Family History  Problem Relation Age of Onset   Healthy Mother    Healthy Father    Breast cancer Paternal Grandmother    Social History   Tobacco Use   Smoking status: Never   Smokeless tobacco: Never  Vaping Use   Vaping Use: Never used  Substance Use Topics   Alcohol use: Not Currently    Comment: occasional    Drug use: Not Currently    Types: Marijuana    Comment: used daily until + pregnancy   Allergies  Allergen Reactions   Penicillins Anaphylaxis   No current outpatient medications on file prior to visit.   No current facility-administered medications on file prior to visit.     Exam   Vitals:   06/12/21 1530  BP:  115/76  Pulse: 94  Weight: 144 lb 14.4 oz (65.7 kg)   Fetal Heart Rate (bpm): 156  System: General: well-developed, well-nourished female in no acute distress   Skin: normal coloration and turgor, no rashes   Neurologic: oriented, normal, negative, normal mood   Extremities: normal strength, tone, and muscle mass, ROM of all joints is normal   HEENT PERRLA, extraocular movement intact and sclera clear, anicteric   Neck supple and no masses   Respiratory:  no respiratory distress      Assessment:   Pregnancy: 06/14/21 Patient Active Problem List   Diagnosis Date Noted   Encounter for supervision of low-risk pregnancy in second trimester 06/12/2021   Mild depression 04/19/2021   Substance induced mood disorder (HCC) 09/06/2020   Major depressive disorder, recurrent episode, moderate (HCC) 08/01/2020   Generalized anxiety disorder 08/01/2020   SAB (spontaneous abortion) 07/19/2020   Depression affecting pregnancy in third trimester, antepartum 12/14/2019   ASCUS with positive high risk HPV cervical 10/18/2019  Plan:  1. Encounter for supervision of low-risk pregnancy in second trimester BP and FHR normal Initial labs drawn. Continue prenatal vitamins. Genetic Screening discussed, NIPS: declined. Ultrasound discussed; fetal anatomic survey: ordered. Problem list reviewed and updated. The nature of [facility] with multiple MDs and other Advanced Practice Providers was explained to patient; also emphasized that residents, students are part of our team.  2. ASCUS with positive high risk HPV cervical Due for repeat co-testing, defer to postpartum period per patient preference  3. Mild depression Reports she feels well off of all medications Aware she has resources here if needed  4. Generalized anxiety disorder    Routine obstetric precautions reviewed. Return in 4 weeks (on 07/10/2021) for [facility], ob visit.

## 2021-06-12 NOTE — Patient Instructions (Signed)
Second Trimester of Pregnancy °The second trimester of pregnancy is from week 13 through week 27. This is months 4 through 6 of pregnancy. The second trimester is often a time when you feel your best. Your body has adjusted to being pregnant, and you begin to feel better physically. °During the second trimester: °Morning sickness has lessened or stopped completely. °You may have more energy. °You may have an increase in appetite. °The second trimester is also a time when the unborn baby (fetus) is growing rapidly. At the end of the sixth month, the fetus may be up to 12 inches long and weigh about 1½ pounds. You will likely begin to feel the baby move (quickening) between 16 and 20 weeks of pregnancy. °Body changes during your second trimester °Your body continues to go through many changes during your second trimester. The changes vary and generally return to normal after the baby is born. °Physical changes °Your weight will continue to increase. You will notice your lower abdomen bulging out. °You may begin to get stretch marks on your hips, abdomen, and breasts. °Your breasts will continue to grow and to become tender. °Dark spots or blotches (chloasma or mask of pregnancy) may develop on your face. °A dark line from your belly button to the pubic area (linea nigra) may appear. °You may have changes in your hair. These can include thickening of your hair, rapid growth, and changes in texture. Some people also have hair loss during or after pregnancy, or hair that feels dry or thin. °Health changes °You may develop headaches. °You may have heartburn. °You may develop constipation. °You may develop hemorrhoids or swollen, bulging veins (varicose veins). °Your gums may bleed and may be sensitive to brushing and flossing. °You may urinate more often because the fetus is pressing on your bladder. °You may have back pain. This is caused by: °Weight gain. °Pregnancy hormones that are relaxing the joints in your  pelvis. °A shift in weight and the muscles that support your balance. °Follow these instructions at home: °Medicines °Follow your health care provider's instructions regarding medicine use. Specific medicines may be either safe or unsafe to take during pregnancy. Do not take any medicines unless approved by your health care provider. °Take a prenatal vitamin that contains at least 600 micrograms (mcg) of folic acid. °Eating and drinking °Eat a healthy diet that includes fresh fruits and vegetables, whole grains, good sources of protein such as meat, eggs, or tofu, and low-fat dairy products. °Avoid raw meat and unpasteurized juice, milk, and cheese. These carry germs that can harm you and your baby. °You may need to take these actions to prevent or treat constipation: °Drink enough fluid to keep your urine pale yellow. °Eat foods that are high in fiber, such as beans, whole grains, and fresh fruits and vegetables. °Limit foods that are high in fat and processed sugars, such as fried or sweet foods. °Activity °Exercise only as directed by your health care provider. Most people can continue their usual exercise routine during pregnancy. Try to exercise for 30 minutes at least 5 days a week. Stop exercising if you develop contractions in your uterus. °Stop exercising if you develop pain or cramping in the lower abdomen or lower back. °Avoid exercising if it is very hot or humid or if you are at a high altitude. °Avoid heavy lifting. °If you choose to, you may have sex unless your health care provider tells you not to. °Relieving pain and discomfort °Wear a supportive bra   to prevent discomfort from breast tenderness. °Take warm sitz baths to soothe any pain or discomfort caused by hemorrhoids. Use hemorrhoid cream if your health care provider approves. °Rest with your legs raised (elevated) if you have leg cramps or low back pain. °If you develop varicose veins: °Wear support hose as told by your health care  provider. °Elevate your feet for 15 minutes, 3-4 times a day. °Limit salt in your diet. °Safety °Wear your seat belt at all times when driving or riding in a car. °Talk with your health care provider if someone is verbally or physically abusive to you. °Lifestyle °Do not use hot tubs, steam rooms, or saunas. °Do not douche. Do not use tampons or scented sanitary pads. °Avoid cat litter boxes and soil used by cats. These carry germs that can cause birth defects in the baby and possibly loss of the fetus by miscarriage or stillbirth. °Do not use herbal remedies, alcohol, illegal drugs, or medicines that are not approved by your health care provider. Chemicals in these products can harm your baby. °Do not use any products that contain nicotine or tobacco, such as cigarettes, e-cigarettes, and chewing tobacco. If you need help quitting, ask your health care provider. °General instructions °During a routine prenatal visit, your health care provider will do a physical exam and other tests. He or she will also discuss your overall health. Keep all follow-up visits. This is important. °Ask your health care provider for a referral to a local prenatal education class. °Ask for help if you have counseling or nutritional needs during pregnancy. Your health care provider can offer advice or refer you to specialists for help with various needs. °Where to find more information °American Pregnancy Association: americanpregnancy.org °American College of Obstetricians and Gynecologists: acog.org/en/Womens%20Health/Pregnancy °Office on Women's Health: womenshealth.gov/pregnancy °Contact a health care provider if you have: °A headache that does not go away when you take medicine. °Vision changes or you see spots in front of your eyes. °Mild pelvic cramps, pelvic pressure, or nagging pain in the abdominal area. °Persistent nausea, vomiting, or diarrhea. °A bad-smelling vaginal discharge or foul-smelling urine. °Pain when you  urinate. °Sudden or extreme swelling of your face, hands, ankles, feet, or legs. °A fever. °Get help right away if you: °Have fluid leaking from your vagina. °Have spotting or bleeding from your vagina. °Have severe abdominal cramping or pain. °Have difficulty breathing. °Have chest pain. °Have fainting spells. °Have not felt your baby move for the time period told by your health care provider. °Have new or increased pain, swelling, or redness in an arm or leg. °Summary °The second trimester of pregnancy is from week 13 through week 27 (months 4 through 6). °Do not use herbal remedies, alcohol, illegal drugs, or medicines that are not approved by your health care provider. Chemicals in these products can harm your baby. °Exercise only as directed by your health care provider. Most people can continue their usual exercise routine during pregnancy. °Keep all follow-up visits. This is important. °This information is not intended to replace advice given to you by your health care provider. Make sure you discuss any questions you have with your health care provider. °Document Revised: 11/16/2019 Document Reviewed: 09/22/2019 °Elsevier Patient Education © 2022 [employer] Inc. ° °Contraception Choices °Contraception, also called birth control, refers to methods or devices that prevent pregnancy. °Hormonal methods °Contraceptive implant °A contraceptive implant is a thin, plastic tube that contains a hormone that prevents pregnancy. It is different from an intrauterine device (IUD). It   is inserted into the upper part of the arm by a health care provider. Implants can be effective for up to 3 years. °Progestin-only injections °Progestin-only injections are injections of progestin, a synthetic form of the hormone progesterone. They are given every 3 months by a health care provider. °Birth control pills °Birth control pills are pills that contain hormones that prevent pregnancy. They must be taken once a day, preferably at the  same time each day. A prescription is needed to use this method of contraception. °Birth control patch °The birth control patch contains hormones that prevent pregnancy. It is placed on the skin and must be changed once a week for three weeks and removed on the fourth week. A prescription is needed to use this method of contraception. °Vaginal ring °A vaginal ring contains hormones that prevent pregnancy. It is placed in the vagina for three weeks and removed on the fourth week. After that, the process is repeated with a new ring. A prescription is needed to use this method of contraception. °Emergency contraceptive °Emergency contraceptives prevent pregnancy after unprotected sex. They come in pill form and can be taken up to 5 days after sex. They work best the sooner they are taken after having sex. Most emergency contraceptives are available without a prescription. This method should not be used as your only form of birth control. °Barrier methods °Female condom °A female condom is a thin sheath that is worn over the penis during sex. Condoms keep sperm from going inside a woman's body. They can be used with a sperm-killing substance (spermicide) to increase their effectiveness. They should be thrown away after one use. °Female condom °A female condom is a soft, loose-fitting sheath that is put into the vagina before sex. The condom keeps sperm from going inside a woman's body. They should be thrown away after one use. °Diaphragm °A diaphragm is a soft, dome-shaped barrier. It is inserted into the vagina before sex, along with a spermicide. The diaphragm blocks sperm from entering the uterus, and the spermicide kills sperm. A diaphragm should be left in the vagina for 6-8 hours after sex and removed within 24 hours. °A diaphragm is prescribed and fitted by a health care provider. A diaphragm should be replaced every 1-2 years, after giving birth, after gaining more than 15 lb (6.8 kg), and after pelvic  surgery. °Cervical cap °A cervical cap is a round, soft latex or plastic cup that fits over the cervix. It is inserted into the vagina before sex, along with spermicide. It blocks sperm from entering the uterus. The cap should be left in place for 6-8 hours after sex and removed within 48 hours. A cervical cap must be prescribed and fitted by a health care provider. It should be replaced every 2 years. °Sponge °A sponge is a soft, circular piece of polyurethane foam with spermicide in it. The sponge helps block sperm from entering the uterus, and the spermicide kills sperm. To use it, you make it wet and then insert it into the vagina. It should be inserted before sex, left in for at least 6 hours after sex, and removed and thrown away within 30 hours. °Spermicides °Spermicides are chemicals that kill or block sperm from entering the cervix and uterus. They can come as a cream, jelly, suppository, foam, or tablet. A spermicide should be inserted into the vagina with an applicator at least 10-15 minutes before sex to allow time for it to work. The process must be repeated every time   you have sex. Spermicides do not require a prescription. °Intrauterine contraception °Intrauterine device (IUD) °An IUD is a T-shaped device that is put in a woman's uterus. There are two types: °Hormone IUD.This type contains progestin, a synthetic form of the hormone progesterone. This type can stay in place for 3-5 years. °Copper IUD.This type is wrapped in copper wire. It can stay in place for 10 years. °Permanent methods of contraception °Female tubal ligation °In this method, a woman's fallopian tubes are sealed, tied, or blocked during surgery to prevent eggs from traveling to the uterus. °Hysteroscopic sterilization °In this method, a small, flexible insert is placed into each fallopian tube. The inserts cause scar tissue to form in the fallopian tubes and block them, so sperm cannot reach an egg. The procedure takes about 3  months to be effective. Another form of birth control must be used during those 3 months. °Female sterilization °This is a procedure to tie off the tubes that carry sperm (vasectomy). After the procedure, the man can still ejaculate fluid (semen). Another form of birth control must be used for 3 months after the procedure. °Natural planning methods °Natural family planning °In this method, a couple does not have sex on days when the woman could become pregnant. °Calendar method °In this method, the woman keeps track of the length of each menstrual cycle, identifies the days when pregnancy can happen, and does not have sex on those days. °Ovulation method °In this method, a couple avoids sex during ovulation. °Symptothermal method °This method involves not having sex during ovulation. The woman typically checks for ovulation by watching changes in her temperature and in the consistency of cervical mucus. °Post-ovulation method °In this method, a couple waits to have sex until after ovulation. °Where to find more information °Centers for Disease Control and Prevention: www.cdc.gov °Summary °Contraception, also called birth control, refers to methods or devices that prevent pregnancy. °Hormonal methods of contraception include implants, injections, pills, patches, vaginal rings, and emergency contraceptives. °Barrier methods of contraception can include female condoms, female condoms, diaphragms, cervical caps, sponges, and spermicides. °There are two types of IUDs (intrauterine devices). An IUD can be put in a woman's uterus to prevent pregnancy for 3-5 years. °Permanent sterilization can be done through a procedure for males and females. Natural family planning methods involve nothaving sex on days when the woman could become pregnant. °This information is not intended to replace advice given to you by your health care provider. Make sure you discuss any questions you have with your health care provider. °Document  Revised: 11/14/2019 Document Reviewed: 11/14/2019 °Elsevier Patient Education © 2022 Elsevier Inc. ° °

## 2021-06-13 LAB — CBC/D/PLT+RPR+RH+ABO+RUBIGG...
Antibody Screen: NEGATIVE
Basophils Absolute: 0 10*3/uL (ref 0.0–0.2)
Basos: 0 %
EOS (ABSOLUTE): 0 10*3/uL (ref 0.0–0.4)
Eos: 1 %
HCV Ab: <0.1 s/co ratio (ref 0.0–0.9)
HIV Screen 4th Generation wRfx: NONREACTIVE
Hematocrit: 33.4 % — ABNORMAL LOW (ref 34.0–46.6)
Hemoglobin: 11.3 g/dL (ref 11.1–15.9)
Hepatitis B Surface Ag: NEGATIVE
Immature Grans (Abs): 0 10*3/uL (ref 0.0–0.1)
Immature Granulocytes: 0 %
Lymphocytes Absolute: 1.3 10*3/uL (ref 0.7–3.1)
Lymphs: 19 %
MCH: 31.1 pg (ref 26.6–33.0)
MCHC: 33.8 g/dL (ref 31.5–35.7)
MCV: 92 fL (ref 79–97)
Monocytes Absolute: 0.4 10*3/uL (ref 0.1–0.9)
Monocytes: 7 %
Neutrophils Absolute: 4.8 10*3/uL (ref 1.4–7.0)
Neutrophils: 73 %
Platelets: 136 10*3/uL — ABNORMAL LOW (ref 150–450)
RBC: 3.63 x10E6/uL — ABNORMAL LOW (ref 3.77–5.28)
RDW: 12.7 % (ref 11.7–15.4)
RPR Ser Ql: NONREACTIVE
Rh Factor: POSITIVE
Rubella Antibodies, IGG: 1.17 index (ref >0.99)
WBC: 6.6 10*3/uL (ref 3.4–10.8)

## 2021-06-13 LAB — HCV INTERPRETATION

## 2021-06-13 LAB — HEMOGLOBIN A1C
Est. average glucose Bld gHb Est-mCnc: 100 mg/dL
Hgb A1c MFr Bld: 5.1 % (ref 4.8–5.6)

## 2021-06-14 LAB — URINE CULTURE, OB REFLEX

## 2021-06-14 LAB — CULTURE, OB URINE

## 2021-06-23 NOTE — L&D Delivery Note (Signed)
Delivery Note ?Megan Rocha is a 31 y.o. [id number] at [redacted]w[redacted]d admitted for eIOL.  ? ?GBS Status:  Negative/-- (04/21 1117) ?Maximum Maternal Temperature: 98.5 ? ?Labor course: Initial SVE: 3/70/-2. Augmentation with: AROM and Pitocin. She then progressed to complete.  ?ROM: AROM at 0929 with clear fluid ? ?Birth: At 1817 a viable female was delivered via spontaneous vaginal delivery (Presentation:cephalic;LOA). Nuchal cord present: No.  Shoulders and body delivered in usual fashion. Infant placed directly on mom's abdomen for bonding/skin-to-skin, baby dried and stimulated. Cord clamped x 2 after 1 minute and cut by FOB.  Cord blood collected.  The placenta separated spontaneously and delivered via gentle cord traction.  Pitocin infused rapidly IV per protocol.  Fundus firm with massage.  ?Placenta inspected and appears to be intact with a 3 VC.  Placenta/Cord with the following complications: none. Cord pH: n/a ?Sponge and instrument count were correct x2. ? ?Intrapartum complications:  None ?Anesthesia:  none ?Episiotomy: none ?Lacerations:  none ?Suture Repair:  n/a ?EBL (mL): 50 ? ? ?Infant: ?APGAR (1 MIN): 8   ?APGAR (5 MINS): 9   ?Infant weight: pending ? ?Mom to postpartum.  Baby to Couplet care / Skin to Skin. Placenta to L&D   ?Plans to Breastfeed ?Contraception: tubal ligation ?Circumcision: wants inpatient ? ?Note sent to [facility]: [facility] for pp visit. ? ? ?Renee Harder CNM ?10/28/2021 ?6:50 PM ? ? ? ?

## 2021-06-26 ENCOUNTER — Ambulatory Visit (INDEPENDENT_AMBULATORY_CARE_PROVIDER_SITE_OTHER): Payer: Medicaid Other | Admitting: Licensed Clinical Social Worker

## 2021-06-26 DIAGNOSIS — F411 Generalized anxiety disorder: Secondary | ICD-10-CM
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
## 2021-07-08 ENCOUNTER — Ambulatory Visit: Payer: Medicaid Other | Attending: Family Medicine

## 2021-07-08 ENCOUNTER — Ambulatory Visit (INDEPENDENT_AMBULATORY_CARE_PROVIDER_SITE_OTHER): Payer: Medicaid Other | Admitting: Obstetrics and Gynecology

## 2021-07-08 ENCOUNTER — Other Ambulatory Visit: Payer: Self-pay | Admitting: Family Medicine

## 2021-07-08 ENCOUNTER — Other Ambulatory Visit: Payer: Self-pay | Admitting: *Deleted

## 2021-07-08 ENCOUNTER — Other Ambulatory Visit: Payer: Self-pay

## 2021-07-08 VITALS — BP 100/58 | HR 88 | Wt 147.0 lb

## 2021-07-08 DIAGNOSIS — N870 Mild cervical dysplasia: Secondary | ICD-10-CM

## 2021-07-08 DIAGNOSIS — Z3492 Encounter for supervision of normal pregnancy, unspecified, second trimester: Secondary | ICD-10-CM | POA: Diagnosis not present

## 2021-07-08 DIAGNOSIS — F32A Depression, unspecified: Secondary | ICD-10-CM

## 2021-07-08 DIAGNOSIS — D696 Thrombocytopenia, unspecified: Secondary | ICD-10-CM

## 2021-07-08 DIAGNOSIS — O99112 Other diseases of the blood and blood-forming organs and certain disorders involving the immune mechanism complicating pregnancy, second trimester: Secondary | ICD-10-CM | POA: Insufficient documentation

## 2021-07-08 DIAGNOSIS — Z363 Encounter for antenatal screening for malformations: Secondary | ICD-10-CM | POA: Diagnosis not present

## 2021-07-08 DIAGNOSIS — Z3A23 23 weeks gestation of pregnancy: Secondary | ICD-10-CM | POA: Diagnosis not present

## 2021-07-08 DIAGNOSIS — O99343 Other mental disorders complicating pregnancy, third trimester: Secondary | ICD-10-CM

## 2021-07-08 DIAGNOSIS — Z362 Encounter for other antenatal screening follow-up: Secondary | ICD-10-CM

## 2021-07-08 DIAGNOSIS — O99119 Other diseases of the blood and blood-forming organs and certain disorders involving the immune mechanism complicating pregnancy, unspecified trimester: Secondary | ICD-10-CM

## 2021-07-08 LAB — CBC
Hematocrit: 31.4 % — ABNORMAL LOW (ref 34.0–46.6)
Hemoglobin: 10.9 g/dL — ABNORMAL LOW (ref 11.1–15.9)
MCH: 31.5 pg (ref 26.6–33.0)
MCHC: 34.7 g/dL (ref 31.5–35.7)
MCV: 91 fL (ref 79–97)
Platelets: 138 10*3/uL — ABNORMAL LOW (ref 150–450)
RBC: 3.46 x10E6/uL — ABNORMAL LOW (ref 3.77–5.28)
RDW: 11.9 % (ref 11.7–15.4)
WBC: 7.1 10*3/uL (ref 3.4–10.8)

## 2021-07-08 NOTE — Progress Notes (Addendum)
° °  PRENATAL VISIT NOTE  Subjective:  Megan Rocha is a 31 y.o. 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  She is currently monitored for the following issues for this low-risk pregnancy and has ASCUS with positive high risk HPV cervical; Benign gestational thrombocytopenia in second trimester ([place]); Depression affecting pregnancy in third trimester, antepartum; Major depressive disorder, recurrent episode, moderate ([place]); Generalized anxiety disorder; Substance induced mood disorder ([place]); and Encounter for supervision of low-risk pregnancy in second trimester on their problem list.  Patient reports no complaints.  Contractions: Not present. Vag. Bleeding: None.  Movement: Present. Denies leaking of fluid.   The following portions of the patient's history were reviewed and updated as appropriate: allergies, current medications, past family history, past medical history, past social history, past surgical history and problem list.   Objective:   Vitals:   07/08/21 0832  BP: (!) 100/58  Pulse: 88  Weight: 147 lb (66.7 kg)    Fetal Status: Fetal Heart Rate (bpm): 147   Movement: Present     General:  Alert, oriented and cooperative. Patient is in no acute distress.  Skin: Skin is warm and dry. No rash noted.   Cardiovascular: Normal heart rate noted  Respiratory: Normal respiratory effort, no problems with respiration noted  Abdomen: Soft, gravid, appropriate for gestational age.  Pain/Pressure: Present     Pelvic: Cervical exam deferred        Extremities: Normal range of motion.  Edema: None  Mental Status: Normal mood and affect. Normal behavior. Normal judgment and thought content.   Assessment and Plan:  Pregnancy: [id number] at [redacted]w[redacted]d 1. [redacted] weeks gestation of pregnancy F/u mfm anatomy u/s today - CBC  2. Benign gestational thrombocytopenia in second trimester ([place]) Rpt today; pt with this in prior pregnancies CBC Latest Ref Rng & Units 06/12/2021 12/08/2020  07/02/2020  WBC 3.4 - 10.8 x10E3/uL 6.6 5.4 4.3  Hemoglobin 11.1 - 15.9 g/dL 11.3 12.6 12.1  Hematocrit 34.0 - 46.6 % 33.4(L) 38.0 37.3  Platelets 150 - 450 x10E3/uL 136(L) 171 117(L)   - CBC  3. Dysplasia of cervix, low grade (CIN 1) Needs pap PP  4. Depression affecting pregnancy in third trimester, antepartum No issues today on no meds  Preterm labor symptoms and general obstetric precautions including but not limited to vaginal bleeding, contractions, leaking of fluid and fetal movement were reviewed in detail with the patient. Please refer to After Visit Summary for other counseling recommendations.   Return in about 3 weeks (around 07/29/2021) for 3-4wks, in person, low risk ob, fasting 2hr GTT.  Future Appointments  Date Time Provider [place]  07/12/2021 10:30 AM Salley Slaughter, NP [facility] None  08/07/2021  8:15 AM Starr Lake, CNM [facility]  08/07/2021  8:50 AM [facility] LAB [facility]  09/04/2021  9:00 AM Hermine Messick, LCSW [facility] None  09/25/2021  8:00 AM Hermine Messick, LCSW [facility] None  10/16/2021  8:00 AM Hermine Messick, LCSW [facility] None    Aletha Halim, MD

## 2021-07-12 ENCOUNTER — Encounter (HOSPITAL_COMMUNITY): Payer: Self-pay

## 2021-07-12 ENCOUNTER — Telehealth (HOSPITAL_COMMUNITY): Payer: Medicaid Other | Admitting: Psychiatry

## 2021-07-23 ENCOUNTER — Ambulatory Visit (INDEPENDENT_AMBULATORY_CARE_PROVIDER_SITE_OTHER): Payer: Medicaid Other | Admitting: Licensed Clinical Social Worker

## 2021-07-23 DIAGNOSIS — F411 Generalized anxiety disorder: Secondary | ICD-10-CM | POA: Diagnosis not present

## 2021-07-23 NOTE — Progress Notes (Signed)
THERAPIST PROGRESS NOTE   Virtual Visit via Video Note  I connected with Megan Rocha on 07/23/21 at  2:00 PM EST by a video enabled telemedicine application and verified that I am speaking with the correct person using two identifiers.  Location: Patient: Home Provider: [facility]   I discussed the limitations of evaluation and management by telemedicine and the availability of in person appointments. The patient expressed understanding and agreed to proceed. I discussed the assessment and treatment plan with the patient. The patient was provided an opportunity to ask questions and all were answered. The patient agreed with the plan and demonstrated an understanding of the instructions.   The patient was advised to call back or seek an in-person evaluation if the symptoms worsen or if the condition fails to improve as anticipated.  I provided 35 minutes of non-face-to-face time during this encounter.  Participation Level: Active  Behavioral Response: CasualAlertNegative, Anxious, and Irritable  Type of Therapy: Individual Therapy  Treatment Goals addressed: Anxiety and Coping  Interventions: Solution Focused, Supportive, and Reframing  Summary: Megan Rocha is a 31 y.o. female who presents with hx of GAD.  Today patient logs on for video session.  This is the first session since initial session January 4.  LCSW assessed for any significant changes since initial session.  Patient reports her 44-year-old son has picked up the sport of football and they are very busy with the added activity this has prompted.  LCSW assessed for how patient was feeling related to her current pregnancy.  Patient states she is overall doing well but is noticing more fatigue as her pregnancy advances.  Patient has complaints of insomnia despite increased fatigue.  Patient reports she can fall asleep but is not able to stay asleep and becomes very frustrated, anxious she is unable to rest.  Additional assessment  reveals this is a fairly longstanding problem.  Patient reports in the past she has had trazodone yet she was not able to stay asleep with this medication either.  Patient reports she will probably accept medication evaluation once she delivers her child (a boy) and has completed breast-feeding.  Patient states she has continued to have unwanted anxiety and sleep problems despite medications she has tried in the past so she lacks confidence in med management. LCSW validated frustrations of trial and error.  LCSW explored patient's existing coping strategies.  Patient reports when she cannot sleep she gets up and walks around the house, gets on her phone or reads.  LCSW provided education on not using electronics 30 minutes prior to bed and recommended she not be on her phone in the middle of the night.  Patient states reading is with a paper book.  LCSW explored patient's use of deep breathing, meditation, self guided imagery.  Patient reports meditation is not for her, deep breathing is "hit or miss" and she does not know about self guided imagery.  [name] is noted to become irritable when discussing these techniques saying she wants something to permanently work for her and her goal is to not have to continue counseling or need medications.  LCSW addressed realistic expectations and encouraged use of deep breathing with the focus on the times that it does successfully help her.  LCSW provided detailed education on self guided imagery.  Patient reports her most peaceful and calm place is at the beach.  Upon completion of education patient states she feels this is all "clich" and she "wants something new".  LCSW points out  that per her report self guided imagery is new since she did not know anything about it.  Patient continues to be irritable and negative. LCSW reviewed poc including scheduling prior to close of session.    Suicidal/Homicidal: Nowithout intent/plan  Therapist Response: Pt minimally receptive  to care.  Plan: Return again in ~3 weeks.  Diagnosis: Axis I: Generalized Anxiety Disorder    Sink, LCSW 07/23/2021

## 2021-08-07 ENCOUNTER — Ambulatory Visit: Payer: Medicaid Other | Admitting: *Deleted

## 2021-08-07 ENCOUNTER — Ambulatory Visit: Payer: Medicaid Other | Attending: Obstetrics and Gynecology

## 2021-08-07 ENCOUNTER — Other Ambulatory Visit: Payer: Self-pay

## 2021-08-07 ENCOUNTER — Encounter: Payer: Self-pay | Admitting: *Deleted

## 2021-08-07 ENCOUNTER — Encounter: Payer: Self-pay | Admitting: Family Medicine

## 2021-08-07 ENCOUNTER — Other Ambulatory Visit: Payer: Medicaid Other

## 2021-08-07 ENCOUNTER — Ambulatory Visit (INDEPENDENT_AMBULATORY_CARE_PROVIDER_SITE_OTHER): Payer: Medicaid Other | Admitting: Student

## 2021-08-07 VITALS — BP 101/58 | HR 81 | Wt 155.0 lb

## 2021-08-07 VITALS — BP 101/56 | HR 68

## 2021-08-07 DIAGNOSIS — Z362 Encounter for other antenatal screening follow-up: Secondary | ICD-10-CM | POA: Diagnosis not present

## 2021-08-07 DIAGNOSIS — Z3A28 28 weeks gestation of pregnancy: Secondary | ICD-10-CM

## 2021-08-07 DIAGNOSIS — O99112 Other diseases of the blood and blood-forming organs and certain disorders involving the immune mechanism complicating pregnancy, second trimester: Secondary | ICD-10-CM | POA: Diagnosis not present

## 2021-08-07 DIAGNOSIS — Z3492 Encounter for supervision of normal pregnancy, unspecified, second trimester: Secondary | ICD-10-CM | POA: Diagnosis not present

## 2021-08-07 DIAGNOSIS — O99119 Other diseases of the blood and blood-forming organs and certain disorders involving the immune mechanism complicating pregnancy, unspecified trimester: Secondary | ICD-10-CM | POA: Insufficient documentation

## 2021-08-07 DIAGNOSIS — Z3A27 27 weeks gestation of pregnancy: Secondary | ICD-10-CM

## 2021-08-07 DIAGNOSIS — D696 Thrombocytopenia, unspecified: Secondary | ICD-10-CM | POA: Diagnosis not present

## 2021-08-07 MED ORDER — HYDROCORT-PRAMOXINE (PERIANAL) 1-1 % EX FOAM: 1.0000 | Freq: Two times a day (BID) | CUTANEOUS | 1 refills | Status: DC

## 2021-08-07 NOTE — Progress Notes (Signed)
Patient declined TDAP vaccine

## 2021-08-07 NOTE — Progress Notes (Signed)
° °  PRENATAL VISIT NOTE  Subjective:  Megan Rocha is a 31 y.o. [id number] at [redacted]w[redacted]d being seen today for ongoing prenatal care.  She is currently monitored for the following issues for this low-risk pregnancy and has ASCUS with positive high risk HPV cervical; Benign gestational thrombocytopenia in second trimester (HCC); Depression affecting pregnancy in third trimester, antepartum; Major depressive disorder, recurrent episode, moderate (HCC); Generalized anxiety disorder; Substance induced mood disorder (HCC); and Encounter for supervision of low-risk pregnancy in second trimester on their problem list.  Patient reports no complaints.   Concerned about hemorroids; she has tried to do sitz bath. She has had them since her last pregnancy. Contractions: Not present. Vag. Bleeding: None.  Movement: Present. Denies leaking of fluid.   The following portions of the patient's history were reviewed and updated as appropriate: allergies, current medications, past family history, past medical history, past social history, past surgical history and problem list.   Objective:   Vitals:   08/07/21 0823  BP: (!) 101/58  Pulse: 81  Weight: 155 lb (70.3 kg)    Fetal Status: Fetal Heart Rate (bpm): 134   Movement: Present     General:  Alert, oriented and cooperative. Patient is in no acute distress.  Skin: Skin is warm and dry. No rash noted.   Cardiovascular: Normal heart rate noted  Respiratory: Normal respiratory effort, no problems with respiration noted  Abdomen: Soft, gravid, appropriate for gestational age.  Pain/Pressure: Present     Pelvic: Cervical exam deferred        Extremities: Normal range of motion.     Mental Status: Normal mood and affect. Normal behavior. Normal judgment and thought content.   Assessment and Plan:  Pregnancy: [id number] at [redacted]w[redacted]d 1. [redacted] weeks gestation of pregnancy -will give RX for proctofoam -comfort measures -talked about birth control, patient's partner is  considering vasectomy  Preterm labor symptoms and general obstetric precautions including but not limited to vaginal bleeding, contractions, leaking of fluid and fetal movement were reviewed in detail with the patient. Please refer to After Visit Summary for other counseling recommendations.   No follow-ups on file.  Future Appointments  Date Time Provider Department Center  08/07/2021 10:45 AM [facility] NURSE [facility]  08/07/2021 11:00 AM [facility] US1 [facility]  08/12/2021  1:00 PM Marlette Sink, LCSW [facility] None  08/28/2021 10:55 AM Venora Maples, MD [facility]  09/04/2021  9:00 AM [place] Sink, LCSW [facility] None  09/09/2021 10:30 AM Shanna Cisco, NP [facility] None  09/25/2021  8:00 AM [place] Sink, LCSW [facility] None  10/16/2021  8:00 AM [place] Sink, LCSW [facility] None    [address], [place]

## 2021-08-07 NOTE — Patient Instructions (Signed)
-  try high fiber diet, metamucil

## 2021-08-08 LAB — GLUCOSE TOLERANCE, 2 HOURS W/ 1HR
Glucose, 1 hour: 67 mg/dL — ABNORMAL LOW (ref 70–179)
Glucose, 2 hour: 69 mg/dL — ABNORMAL LOW (ref 70–152)
Glucose, Fasting: 75 mg/dL (ref 70–91)

## 2021-08-09 LAB — CBC
Hematocrit: 33.2 % — ABNORMAL LOW (ref 34.0–46.6)
Hemoglobin: 11.3 g/dL (ref 11.1–15.9)
MCH: 31.9 pg (ref 26.6–33.0)
MCHC: 34 g/dL (ref 31.5–35.7)
MCV: 94 fL (ref 79–97)
Platelets: 124 10*3/uL — ABNORMAL LOW (ref 150–450)
RBC: 3.54 x10E6/uL — ABNORMAL LOW (ref 3.77–5.28)
RDW: 12.1 % (ref 11.7–15.4)
WBC: 7 10*3/uL (ref 3.4–10.8)

## 2021-08-09 LAB — HIV ANTIBODY (ROUTINE TESTING W REFLEX): HIV Screen 4th Generation wRfx: NONREACTIVE

## 2021-08-09 LAB — RPR: RPR Ser Ql: NONREACTIVE

## 2021-08-12 ENCOUNTER — Encounter (HOSPITAL_COMMUNITY): Payer: Self-pay

## 2021-08-12 ENCOUNTER — Ambulatory Visit (HOSPITAL_COMMUNITY): Payer: Medicaid Other | Admitting: Licensed Clinical Social Worker

## 2021-08-12 ENCOUNTER — Telehealth (HOSPITAL_COMMUNITY): Payer: Self-pay | Admitting: Licensed Clinical Social Worker

## 2021-08-12 NOTE — Telephone Encounter (Signed)
LCSW sent text message with link for video session per schedule. LCSW remained available online for session until 1:12pm. Pt failed to log on for session.

## 2021-08-15 ENCOUNTER — Other Ambulatory Visit: Payer: Self-pay

## 2021-08-15 ENCOUNTER — Emergency Department (INDEPENDENT_AMBULATORY_CARE_PROVIDER_SITE_OTHER)
Admission: RE | Admit: 2021-08-15 | Discharge: 2021-08-15 | Disposition: A | Discharge status: Home / Self Care | Payer: Medicaid Other | Source: Ambulatory Visit | Attending: Family Medicine | Admitting: Family Medicine

## 2021-08-15 VITALS — BP 103/65 | HR 98 | Temp 98.2°F | Resp 14 | Wt 155.0 lb

## 2021-08-15 DIAGNOSIS — J029 Acute pharyngitis, unspecified: Secondary | ICD-10-CM | POA: Diagnosis not present

## 2021-08-15 LAB — POCT RAPID STREP A (OFFICE): Rapid Strep A Screen: NEGATIVE

## 2021-08-15 MED ORDER — AZITHROMYCIN 250 MG PO TABS: ORAL_TABLET | ORAL | 0 refills | Status: DC

## 2021-08-15 NOTE — ED Triage Notes (Signed)
Pt presents with cough, congestion and sore throat x 1 month

## 2021-08-15 NOTE — ED Provider Notes (Signed)
Megan Rocha CARE    CSN: [id number] Arrival date & time: 08/15/21  0908      History   Chief Complaint Chief Complaint  Patient presents with   Sore Throat   Cough   Nasal Congestion    HPI Megan Rocha is a 31 y.o. female.   HPI  Patient states that she has been sick for the a month.  She has a 72-year-old and a 31-year-old at home.  They are well.  She has had a runny nose, some postnasal drip, and sore throat.  She states the sore throat is worse in the evenings and when she awakens in the morning, little better during the day.  She is pregnant.  She has talked to her OB/GYN doctor who is discussed with her what over-the-counter medicines are safe to try.  She has not tried any antihistamines.  She has taken some Mucinex.  Past Medical History:  Diagnosis Date   BV (bacterial vaginosis)    Medical history non-contributory    PONV (postoperative nausea and vomiting)    from epidural   UTI (urinary tract infection)    Yeast vaginitis     Patient Active Problem List   Diagnosis Date Noted   Encounter for supervision of low-risk pregnancy in second trimester 06/12/2021   Substance induced mood disorder ([place]) 09/06/2020   Major depressive disorder, recurrent episode, moderate ([place]) 08/01/2020   Generalized anxiety disorder 08/01/2020   Depression affecting pregnancy in third trimester, antepartum 12/14/2019   Benign gestational thrombocytopenia in second trimester ([place]) 11/17/2019   ASCUS with positive high risk HPV cervical 10/18/2019    Past Surgical History:  Procedure Laterality Date   colposcopy     NO PAST SURGERIES      OB History     Gravida  4   Para  2   Term  2   Preterm      AB  1   Living  2      SAB  1   IAB      Ectopic      Multiple  0   Live Births  2            Home Medications    Prior to Admission medications   Medication Sig Start Date End Date Taking? Authorizing Provider  azithromycin (ZITHROMAX Z-PAK)  250 MG tablet Take two pills today followed by one a day until gone 08/15/21  Yes Raylene Everts, MD  hydrocortisone-pramoxine [facility]) rectal foam Place 1 applicator rectally 2 (two) times daily. 08/07/21   Starr Lake, CNM  Prenatal 27-1 MG TABS Take 1 tablet by mouth daily. 06/12/21   Clarnce Flock, MD    Family History Family History  Problem Relation Age of Onset   Healthy Mother    Healthy Father    Breast cancer Paternal Grandmother     Social History Social History   Tobacco Use   Smoking status: Never   Smokeless tobacco: Never  Vaping Use   Vaping Use: Never used  Substance Use Topics   Alcohol use: Not Currently    Comment: occasional    Drug use: Not Currently    Types: Marijuana    Comment: used daily until + pregnancy     Allergies   Penicillins   Review of Systems Review of Systems See HPI  Physical Exam Triage Vital Signs ED Triage Vitals  Enc Vitals Group     BP 08/15/21 0915 103/65  Pulse Rate 08/15/21 0915 98     Resp 08/15/21 0915 14     Temp 08/15/21 0915 98.2 F (36.8 C)     Temp Source 08/15/21 0915 Oral     SpO2 08/15/21 0915 98 %     Weight 08/15/21 0918 155 lb (70.3 kg)     Height --      Head Circumference --      Peak Flow --      Pain Score 08/15/21 0917 2     Pain Loc --      Pain Edu? --      Excl. in [place]? --    No data found.  Updated Vital Signs BP 103/65 (BP Location: Left Arm)    Pulse 98    Temp 98.2 F (36.8 C) (Oral)    Resp 14    Wt 70.3 kg    LMP 01/24/2021    SpO2 98%    BMI 22.24 kg/m      Physical Exam Constitutional:      General: She is not in acute distress.    Appearance: She is well-developed and normal weight.  HENT:     Head: Normocephalic and atraumatic.     Right Ear: Tympanic membrane and ear canal normal.     Left Ear: Tympanic membrane and ear canal normal.     Nose: Congestion and rhinorrhea present.     Mouth/Throat:     Pharynx: Posterior oropharyngeal  erythema present.     Comments: Erythema posterior pharynx.  Thick postnasal drainage noted Eyes:     Conjunctiva/sclera: Conjunctivae normal.     Pupils: Pupils are equal, round, and reactive to light.  Cardiovascular:     Rate and Rhythm: Normal rate and regular rhythm.     Heart sounds: Normal heart sounds.  Pulmonary:     Effort: Pulmonary effort is normal. No respiratory distress.     Breath sounds: Normal breath sounds.  Abdominal:     General: There is distension.     Comments: Obviously pregnant  Musculoskeletal:        General: Normal range of motion.     Cervical back: Normal range of motion.  Lymphadenopathy:     Cervical: Cervical adenopathy present.  Skin:    General: Skin is warm and dry.  Neurological:     General: No focal deficit present.     Mental Status: She is alert.     UC Treatments / Results  Labs (all labs ordered are listed, but only abnormal results are displayed) Labs Reviewed  CULTURE, GROUP A STREP  POCT RAPID STREP A (OFFICE)    EKG   Radiology No results found.  Procedures Procedures (including critical care time)  Medications Ordered in UC Medications - No data to display  Initial Impression / Assessment and Plan / UC Course  I have reviewed the triage vital signs and the nursing notes.  Pertinent labs & imaging results that were available during my care of the patient were reviewed by me and considered in my medical decision making (see chart for details).     Rapid strep is negative.  We will treat with antibiotics because of length of symptoms, 1 month.  Discussed importance of PCP for follow-up Final Clinical Impressions(s) / UC Diagnoses   Final diagnoses:  Acute pharyngitis, unspecified etiology     Discharge Instructions      Take the zyrtec daily Drink lots of water Run humidifier in your room Take antibiotic as  directed    ED Prescriptions     Medication Sig Dispense Auth. Provider   azithromycin  (ZITHROMAX Z-PAK) 250 MG tablet Take two pills today followed by one a day until gone 6 tablet [employer]a [employer] Jennette Banker, MD      PDMP not reviewed this encounter.   Raylene Everts, MD 08/15/21 [phone]

## 2021-08-15 NOTE — Discharge Instructions (Signed)
Take the zyrtec daily Drink lots of water Run humidifier in your room Take antibiotic as directed

## 2021-08-18 LAB — CULTURE, GROUP A STREP: Strep A Culture: NEGATIVE

## 2021-08-28 ENCOUNTER — Other Ambulatory Visit: Payer: Self-pay

## 2021-08-28 ENCOUNTER — Ambulatory Visit (INDEPENDENT_AMBULATORY_CARE_PROVIDER_SITE_OTHER): Payer: Medicaid Other | Admitting: Family Medicine

## 2021-08-28 VITALS — BP 96/60 | HR 71 | Wt 155.1 lb

## 2021-08-28 DIAGNOSIS — Z3492 Encounter for supervision of normal pregnancy, unspecified, second trimester: Secondary | ICD-10-CM

## 2021-08-28 DIAGNOSIS — R87610 Atypical squamous cells of undetermined significance on cytologic smear of cervix (ASC-US): Secondary | ICD-10-CM

## 2021-08-28 DIAGNOSIS — F32A Depression, unspecified: Secondary | ICD-10-CM

## 2021-08-28 DIAGNOSIS — R87810 Cervical high risk human papillomavirus (HPV) DNA test positive: Secondary | ICD-10-CM

## 2021-08-28 DIAGNOSIS — D696 Thrombocytopenia, unspecified: Secondary | ICD-10-CM

## 2021-08-28 DIAGNOSIS — O99343 Other mental disorders complicating pregnancy, third trimester: Secondary | ICD-10-CM

## 2021-08-28 DIAGNOSIS — O99112 Other diseases of the blood and blood-forming organs and certain disorders involving the immune mechanism complicating pregnancy, second trimester: Secondary | ICD-10-CM

## 2021-08-28 NOTE — Patient Instructions (Signed)

## 2021-08-28 NOTE — Progress Notes (Signed)
? ?  Subjective:  ?Megan Rocha is a 31 y.o. [id number] at [redacted]w[redacted]d being seen today for ongoing prenatal care.  She is currently monitored for the following issues for this low-risk pregnancy and has ASCUS with positive high risk HPV cervical; Benign gestational thrombocytopenia in second trimester (HCC); Depression affecting pregnancy in third trimester, antepartum; Major depressive disorder, recurrent episode, moderate (HCC); Generalized anxiety disorder; Substance induced mood disorder (HCC); and Encounter for supervision of low-risk pregnancy in second trimester on their problem list. ? ?Patient reports fatigue.  Contractions: Not present. Vag. Bleeding: None.  Movement: Present. Denies leaking of fluid.  ? ?The following portions of the patient's history were reviewed and updated as appropriate: allergies, current medications, past family history, past medical history, past social history, past surgical history and problem list. Problem list updated. ? ?Objective:  ? ?Vitals:  ? 08/28/21 1128  ?BP: 96/60  ?Pulse: 71  ?Weight: 155 lb 1.6 oz (70.4 kg)  ? ? ?Fetal Status: Fetal Heart Rate (bpm): 146   Movement: Present    ? ?General:  Alert, oriented and cooperative. Patient is in no acute distress.  ?Skin: Skin is warm and dry. No rash noted.   ?Cardiovascular: Normal heart rate noted  ?Respiratory: Normal respiratory effort, no problems with respiration noted  ?Abdomen: Soft, gravid, appropriate for gestational age. Pain/Pressure: Present     ?Pelvic: Vag. Bleeding: None     ?Cervical exam deferred        ?Extremities: Normal range of motion.  Edema: None  ?Mental Status: Normal mood and affect. Normal behavior. Normal judgment and thought content.  ? ?Urinalysis:     ? ?Assessment and Plan:  ?Pregnancy: [id number] at [redacted]w[redacted]d ? ?1. Encounter for supervision of low-risk pregnancy in second trimester ?BP and FHR normal ?TDaP offered, declines ?Long discussion regarding contraception ?Partner considering vasectomy but has  not yet scheduled anything, we reviewed this is a long process and he seems rather hesitant ?Discussed postpartum BTL at length, and afterwards she is strongly considering ?Consent signed today with emphasis that it is a consent not a contract ?Other reversible forms of contraception were discussed with patient. Risks of procedure discussed with patient including but not limited to: risk of regret, permanence of method, bleeding, infection, injury to surrounding organs and need for additional procedures.  Failure risk of about 1% with increased risk of ectopic gestation if pregnancy occurs was also discussed with patient.  ? ?2. Benign gestational thrombocytopenia in second trimester [facility]) ?Last check 124 ?Repeat around 36 weeks ? ?3. ASCUS with positive high risk HPV cervical ?Needs Colpo PP ? ?4. Depression affecting pregnancy in third trimester, antepartum ?Elevated PHQ and GAD scores, declined BH referral ? ?Preterm labor symptoms and general obstetric precautions including but not limited to vaginal bleeding, contractions, leaking of fluid and fetal movement were reviewed in detail with the patient. ?Please refer to After Visit Summary for other counseling recommendations.  ?Return in 2 weeks (on 09/11/2021). ? ? ?Venora Maples, MD ? ?

## 2021-09-04 ENCOUNTER — Ambulatory Visit (HOSPITAL_COMMUNITY): Payer: Medicaid Other | Admitting: Licensed Clinical Social Worker

## 2021-09-09 ENCOUNTER — Telehealth (HOSPITAL_COMMUNITY): Payer: Medicaid Other | Admitting: Psychiatry

## 2021-09-11 ENCOUNTER — Ambulatory Visit (INDEPENDENT_AMBULATORY_CARE_PROVIDER_SITE_OTHER): Payer: Medicaid Other | Admitting: Certified Nurse Midwife

## 2021-09-11 ENCOUNTER — Other Ambulatory Visit (HOSPITAL_COMMUNITY)
Admission: RE | Admit: 2021-09-11 | Discharge: 2021-09-11 | Disposition: A | Discharge status: Home / Self Care | Payer: Medicaid Other | Source: Ambulatory Visit | Attending: Certified Nurse Midwife | Admitting: Certified Nurse Midwife

## 2021-09-11 ENCOUNTER — Other Ambulatory Visit: Payer: Self-pay

## 2021-09-11 VITALS — BP 102/72 | HR 81 | Wt 160.5 lb

## 2021-09-11 DIAGNOSIS — B851 Pediculosis due to Pediculus humanus corporis: Secondary | ICD-10-CM | POA: Insufficient documentation

## 2021-09-11 DIAGNOSIS — N898 Other specified noninflammatory disorders of vagina: Secondary | ICD-10-CM

## 2021-09-11 DIAGNOSIS — Z3A32 32 weeks gestation of pregnancy: Secondary | ICD-10-CM

## 2021-09-11 DIAGNOSIS — Z1331 Encounter for screening for depression: Secondary | ICD-10-CM

## 2021-09-11 DIAGNOSIS — Z3493 Encounter for supervision of normal pregnancy, unspecified, third trimester: Secondary | ICD-10-CM

## 2021-09-11 DIAGNOSIS — B3731 Acute candidiasis of vulva and vagina: Secondary | ICD-10-CM

## 2021-09-11 NOTE — Progress Notes (Signed)
? ?  PRENATAL VISIT NOTE ? ?Subjective:  ?Megan Rocha is a 31 y.o. [id number] at [redacted]w[redacted]d being seen today for ongoing prenatal care.  She is currently monitored for the following issues for this low-risk pregnancy and has ASCUS with positive high risk HPV cervical; Benign gestational thrombocytopenia in second trimester (HCC); Depression affecting pregnancy in third trimester, antepartum; Major depressive disorder, recurrent episode, moderate (HCC); Generalized anxiety disorder; Substance induced mood disorder (HCC); and Encounter for supervision of low-risk pregnancy in second trimester on their problem list. ? ?Patient reports  increased pelvic pressure and a lot of Braxton Hicks contractions. Gets very uncomfortable toward the end of the day. Has noticed increased runny discharge and wonders if she could have BV .  Contractions: Irritability. Vag. Bleeding: None.  Movement: Present. Denies leaking of fluid.  ? ?The following portions of the patient's history were reviewed and updated as appropriate: allergies, current medications, past family history, past medical history, past social history, past surgical history and problem list.  ? ?Objective:  ? ?Vitals:  ? 09/11/21 1634  ?BP: 102/72  ?Pulse: 81  ?Weight: 160 lb 8 oz (72.8 kg)  ? ?Fetal Status: Fetal Heart Rate (bpm): 138 Fundal Height: 33 cm Movement: Present    ? ?General:  Alert, oriented and cooperative. Patient is in no acute distress.  ?Skin: Skin is warm and dry. No rash noted.   ?Cardiovascular: Normal heart rate noted  ?Respiratory: Normal respiratory effort, no problems with respiration noted  ?Abdomen: Soft, gravid, appropriate for gestational age.  Pain/Pressure: Present     ?Pelvic: Cervical exam deferred        ?Extremities: Normal range of motion.  Edema: None  ?Mental Status: Normal mood and affect. Normal behavior. Normal judgment and thought content.  ? ?Assessment and Plan:  ?Pregnancy: [id number] at [redacted]w[redacted]d ?1. Supervision of low-risk pregnancy,  third trimester ?- Overall doing well, feeling regular and vigorous fetal movement  ? ?2. [redacted] weeks gestation of pregnancy ?- Routine OB care including anticipatory guidance re upcoming GBS swab ? ?3. Positive depression screening ?- Ambulatory referral to Integrated Behavioral [facility] ? ?4. Vaginal discharge ?- Cervicovaginal ancillary only( [place]) ? ?Preterm labor symptoms and general obstetric precautions including but not limited to vaginal bleeding, contractions, leaking of fluid and fetal movement were reviewed in detail with the patient. ?Please refer to After Visit Summary for other counseling recommendations.  ? ?Return in about 2 weeks (around 09/25/2021) for IN-PERSON, LOB. ? ?Future Appointments  ?Date Time Provider Department Center  ?09/12/2021  8:15 AM [facility]  ?09/24/2021 11:15 AM Allayne Stack, DO [facility]  ?09/25/2021  8:00 AM [place] Sink, LCSW [facility] None  ?10/16/2021  8:00 AM Hatch, Carola Rhine, LCSW [facility] None  ? ? ?Bernerd Limbo, CNM ?

## 2021-09-12 ENCOUNTER — Ambulatory Visit: Payer: Medicaid Other | Admitting: Clinical

## 2021-09-12 LAB — CERVICOVAGINAL ANCILLARY ONLY
Bacterial Vaginitis (gardnerella): NEGATIVE
Candida Glabrata: NEGATIVE
Candida Vaginitis: POSITIVE — AB
Comment: NEGATIVE
Comment: NEGATIVE
Comment: NEGATIVE

## 2021-09-12 MED ORDER — TERCONAZOLE 0.4 % VA CREA: 1.0000 | TOPICAL_CREAM | Freq: Every day | VAGINAL | 0 refills | Status: AC

## 2021-09-12 NOTE — BH Specialist Note (Signed)
Pt did not arrive to video visit and did not answer the phone; Left HIPPA-compliant message to call back Donold Marotto from [facility] at  [phone] (Hallis Meditz's office).  ?; left MyChart message for patient.  ? ?

## 2021-09-12 NOTE — Addendum Note (Signed)
Addended by: Edd Arbour on: 09/12/2021 10:46 PM ? ? Modules accepted: Orders ? ?

## 2021-09-24 ENCOUNTER — Encounter: Payer: Self-pay | Admitting: Family Medicine

## 2021-09-25 ENCOUNTER — Ambulatory Visit (HOSPITAL_COMMUNITY): Payer: Medicaid Other | Admitting: Licensed Clinical Social Worker

## 2021-10-01 ENCOUNTER — Ambulatory Visit (INDEPENDENT_AMBULATORY_CARE_PROVIDER_SITE_OTHER): Payer: Medicaid Other | Admitting: Medical

## 2021-10-01 VITALS — BP 109/64 | HR 83 | Wt 165.1 lb

## 2021-10-01 DIAGNOSIS — O99113 Other diseases of the blood and blood-forming organs and certain disorders involving the immune mechanism complicating pregnancy, third trimester: Secondary | ICD-10-CM

## 2021-10-01 DIAGNOSIS — R87810 Cervical high risk human papillomavirus (HPV) DNA test positive: Secondary | ICD-10-CM

## 2021-10-01 DIAGNOSIS — R87610 Atypical squamous cells of undetermined significance on cytologic smear of cervix (ASC-US): Secondary | ICD-10-CM

## 2021-10-01 DIAGNOSIS — F411 Generalized anxiety disorder: Secondary | ICD-10-CM

## 2021-10-01 DIAGNOSIS — F331 Major depressive disorder, recurrent, moderate: Secondary | ICD-10-CM

## 2021-10-01 DIAGNOSIS — D696 Thrombocytopenia, unspecified: Secondary | ICD-10-CM

## 2021-10-01 DIAGNOSIS — Z3492 Encounter for supervision of normal pregnancy, unspecified, second trimester: Secondary | ICD-10-CM

## 2021-10-01 NOTE — Progress Notes (Signed)
? ?  PRENATAL VISIT NOTE ? ?Subjective:  ?Megan Rocha is a 31 y.o. 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  She is currently monitored for the following issues for this high-risk pregnancy and has ASCUS with positive high risk HPV cervical; Benign gestational thrombocytopenia in second trimester ([place]); Depression affecting pregnancy in third trimester, antepartum; Major depressive disorder, recurrent episode, moderate ([place]); Generalized anxiety disorder; Substance induced mood disorder ([place]); and Encounter for supervision of low-risk pregnancy in second trimester on their problem list. ? ?Patient reports occasional contractions and pelvic pressure.  Contractions: Irritability. Vag. Bleeding: None.  Movement: Present. Denies leaking of fluid.  ? ?The following portions of the patient's history were reviewed and updated as appropriate: allergies, current medications, past family history, past medical history, past social history, past surgical history and problem list.  ? ?Objective:  ? ?Vitals:  ? 10/01/21 1331  ?BP: 109/64  ?Pulse: 83  ?Weight: 165 lb 1.6 oz (74.9 kg)  ? ? ?Fetal Status: Fetal Heart Rate (bpm): 138   Movement: Present  Presentation: Vertex ? ?General:  Alert, oriented and cooperative. Patient is in no acute distress.  ?Skin: Skin is warm and dry. No rash noted.   ?Cardiovascular: Normal heart rate noted  ?Respiratory: Normal respiratory effort, no problems with respiration noted  ?Abdomen: Soft, gravid, appropriate for gestational age.  Pain/Pressure: Present     ?Pelvic: Cervical exam performed in the presence of a chaperone Dilation: Closed Effacement (%): 30 Station: -3  ?Extremities: Normal range of motion.  Edema: Trace  ?Mental Status: Normal mood and affect. Normal behavior. Normal judgment and thought content.  ? ?Assessment and Plan:  ?Pregnancy: [id number] at [redacted]w[redacted]d ?1. Encounter for supervision of low-risk pregnancy in second trimester ?- Planning BTL, consent signed  previously ?- Discussed patients questions and concerns about the procedure today  ? ?2. ASCUS with positive high risk HPV cervical ?- Needs PP colpo  ? ?3. Generalized anxiety disorder ?- Doing well, not currently on medications  ? ?4. Major depressive disorder, recurrent episode, moderate ([place]) ?- Doing well, not currently on medications  ? ?5. Benign gestational thrombocytopenia in third trimester [facility]) ?- Needs recheck CBC at next visit  ? ?Preterm labor symptoms and general obstetric precautions including but not limited to vaginal bleeding, contractions, leaking of fluid and fetal movement were reviewed in detail with the patient. ?Please refer to After Visit Summary for other counseling recommendations.  ? ?Return in about 1 week (around 10/08/2021) for [facility] APP, In-Person, any provider. ? ?Future Appointments  ?Date Time Provider [place]  ?10/11/2021  8:55 AM Gabriel Carina, CNM [facility]  ?10/18/2021 10:15 AM Radene Gunning, MD [facility]  ?10/25/2021 10:55 AM Clarnce Flock, MD [facility]  ?10/31/2021  9:15 AM Donnamae Jude, MD [facility]  ?10/31/2021 10:15 AM [facility] NST [facility]  ? ? ?Kerry Hough, PA-C ? ?

## 2021-10-01 NOTE — Progress Notes (Signed)
Pt states she feel like she has been experiences braxton hicks for a while.  ?

## 2021-10-11 ENCOUNTER — Other Ambulatory Visit (HOSPITAL_COMMUNITY)
Admission: RE | Admit: 2021-10-11 | Discharge: 2021-10-11 | Disposition: A | Discharge status: Home / Self Care | Payer: Medicaid Other | Source: Ambulatory Visit | Attending: Certified Nurse Midwife | Admitting: Certified Nurse Midwife

## 2021-10-11 ENCOUNTER — Ambulatory Visit (INDEPENDENT_AMBULATORY_CARE_PROVIDER_SITE_OTHER): Payer: Medicaid Other | Admitting: Certified Nurse Midwife

## 2021-10-11 VITALS — BP 110/69 | HR 67 | Wt 165.8 lb

## 2021-10-11 DIAGNOSIS — Z3493 Encounter for supervision of normal pregnancy, unspecified, third trimester: Secondary | ICD-10-CM | POA: Insufficient documentation

## 2021-10-11 DIAGNOSIS — O2243 Hemorrhoids in pregnancy, third trimester: Secondary | ICD-10-CM

## 2021-10-11 DIAGNOSIS — O99113 Other diseases of the blood and blood-forming organs and certain disorders involving the immune mechanism complicating pregnancy, third trimester: Secondary | ICD-10-CM

## 2021-10-11 DIAGNOSIS — Z3A37 37 weeks gestation of pregnancy: Secondary | ICD-10-CM

## 2021-10-11 DIAGNOSIS — D696 Thrombocytopenia, unspecified: Secondary | ICD-10-CM

## 2021-10-11 MED ORDER — HYDROCORTISONE ACETATE 25 MG RE SUPP: 25.0000 mg | Freq: Two times a day (BID) | RECTAL | 1 refills | Status: DC

## 2021-10-14 LAB — CERVICOVAGINAL ANCILLARY ONLY
Chlamydia: NEGATIVE
Comment: NEGATIVE
Comment: NORMAL
Neisseria Gonorrhea: NEGATIVE

## 2021-10-14 NOTE — Progress Notes (Signed)
? ?  PRENATAL VISIT NOTE ? ?Subjective:  ?Megan Rocha is a 31 y.o. [id number] at [redacted]w[redacted]d being seen today for ongoing prenatal care.  She is currently monitored for the following issues for this low-risk pregnancy and has ASCUS with positive high risk HPV cervical; Benign gestational thrombocytopenia in second trimester (HCC); Depression affecting pregnancy in third trimester, antepartum; Major depressive disorder, recurrent episode, moderate (HCC); Generalized anxiety disorder; Substance induced mood disorder (HCC); and Encounter for supervision of low-risk pregnancy in second trimester on their problem list. ? ?Patient reports  hemorrhoids that are painful after bowel movements .  Contractions: Irritability. Vag. Bleeding: None.  Movement: Present. Denies leaking of fluid.  ? ?The following portions of the patient's history were reviewed and updated as appropriate: allergies, current medications, past family history, past medical history, past social history, past surgical history and problem list.  ? ?Objective:  ? ?Vitals:  ? 10/11/21 0951  ?BP: 110/69  ?Pulse: 67  ?Weight: 165 lb 12.8 oz (75.2 kg)  ? ? ?Fetal Status: Fetal Heart Rate (bpm): 140 Fundal Height: 37 cm Movement: Present    ? ?General:  Alert, oriented and cooperative. Patient is in no acute distress.  ?Skin: Skin is warm and dry. No rash noted.   ?Cardiovascular: Normal heart rate noted  ?Respiratory: Normal respiratory effort, no problems with respiration noted  ?Abdomen: Soft, gravid, appropriate for gestational age.  Pain/Pressure: Present     ?Pelvic: Cervical exam deferred        ?Extremities: Normal range of motion.  Edema: Trace  ?Mental Status: Normal mood and affect. Normal behavior. Normal judgment and thought content.  ? ?Assessment and Plan:  ?Pregnancy: [id number] at [redacted]w[redacted]d ?1. Supervision of low-risk pregnancy, third trimester ?- Doing well, feeling regular and vigorous fetal movement  ? ?2. [redacted] weeks gestation of pregnancy ?- Routine OB care   ?- Culture, beta strep (group b only) ?- Cervicovaginal ancillary only( [place]) ? ?3. Benign gestational thrombocytopenia in third trimester [facility]) ?- will draw CBC at next visit ? ?4. Hemorrhoids during pregnancy in third trimester ?- hydrocortisone (ANUSOL-HC) 25 MG suppository; Place 1 suppository (25 mg total) rectally 2 (two) times daily.  Dispense: 12 suppository; Refill: 1 ? ?Term labor symptoms and general obstetric precautions including but not limited to vaginal bleeding, contractions, leaking of fluid and fetal movement were reviewed in detail with the patient. ?Please refer to After Visit Summary for other counseling recommendations.  ? ?No follow-ups on file. ? ?Future Appointments  ?Date Time Provider Department Center  ?10/18/2021 10:15 AM Milas Hock, MD [facility]  ?10/25/2021 10:55 AM Venora Maples, MD [facility]  ?10/31/2021  9:15 AM Reva Bores, MD [facility]  ?10/31/2021 10:15 AM [facility] NST [facility]  ? ? ?Bernerd Limbo, CNM ?

## 2021-10-14 NOTE — Progress Notes (Addendum)
? ?  PRENATAL VISIT NOTE ? ?Subjective:  ?Megan Rocha is a 31 y.o. [id number] at [redacted]w[redacted]d being seen today for ongoing prenatal care.  She is currently monitored for the following issues for this low-risk pregnancy and has ASCUS with positive high risk HPV cervical; Benign gestational thrombocytopenia in second trimester (HCC); Depression affecting pregnancy in third trimester, antepartum; Major depressive disorder, recurrent episode, moderate (HCC); Generalized anxiety disorder; Substance induced mood disorder (HCC); and Encounter for supervision of low-risk pregnancy in second trimester on their problem list. ? ?Patient reports no complaints.  Contractions: Not present. Vag. Bleeding: None.  Movement: Present. Denies leaking of fluid.  ? ?The following portions of the patient's history were reviewed and updated as appropriate: allergies, current medications, past family history, past medical history, past social history, past surgical history and problem list.  ? ?Objective:  ? ?Vitals:  ? 10/18/21 1021  ?BP: (!) 89/68  ?Pulse: 77  ?Weight: 173 lb 6.4 oz (78.7 kg)  ? ? ?Fetal Status: Fetal Heart Rate (bpm): 140 Fundal Height: 38 cm Movement: Present    ? ?General:  Alert, oriented and cooperative. Patient is in no acute distress.  ?Skin: Skin is warm and dry. No rash noted.   ?Cardiovascular: Normal heart rate noted  ?Respiratory: Normal respiratory effort, no problems with respiration noted  ?Abdomen: Soft, gravid, appropriate for gestational age.  Pain/Pressure: Present     ?Pelvic: Cervical exam deferred        ?Extremities: Normal range of motion.  Edema: Trace  ?Mental Status: Normal mood and affect. Normal behavior. Normal judgment and thought content.  ? ?Assessment and Plan:  ?Pregnancy: [id number] at [redacted]w[redacted]d ?1. Benign gestational thrombocytopenia in second trimester [facility]) ?CBC today ? ?2. Substance induced mood disorder (HCC) ?No issues ? ?3. Encounter for supervision of low-risk pregnancy in second trimester ?GBS  on 4/21 was negative ?Desires IOL before EDC. Reasonable with gestational thrombocytopenia. Scheduled for 5/8. She has had IOL with her two other kids. Plans no epidural.  ? ?Term labor symptoms and general obstetric precautions including but not limited to vaginal bleeding, contractions, leaking of fluid and fetal movement were reviewed in detail with the patient. ?Please refer to After Visit Summary for other counseling recommendations.  ? ?Return in about 1 week (around 10/25/2021) for OB VISIT, MD or APP. ? ?Future Appointments  ?Date Time Provider Department Center  ?10/25/2021 10:55 AM Crissie Reese Mary Sella, MD [facility]  ?10/31/2021  9:15 AM Reva Bores, MD [facility]  ?10/31/2021 10:15 AM [facility]ST [facility]  ? ? ?Milas Hock, MD ?

## 2021-10-15 ENCOUNTER — Encounter: Payer: Medicaid Other | Admitting: Student

## 2021-10-15 LAB — CULTURE, BETA STREP (GROUP B ONLY): Strep Gp B Culture: NEGATIVE

## 2021-10-16 ENCOUNTER — Ambulatory Visit (HOSPITAL_COMMUNITY): Payer: Medicaid Other | Admitting: Licensed Clinical Social Worker

## 2021-10-18 ENCOUNTER — Telehealth (HOSPITAL_COMMUNITY): Payer: Self-pay | Admitting: *Deleted

## 2021-10-18 ENCOUNTER — Ambulatory Visit (INDEPENDENT_AMBULATORY_CARE_PROVIDER_SITE_OTHER): Payer: Medicaid Other | Admitting: Obstetrics and Gynecology

## 2021-10-18 ENCOUNTER — Other Ambulatory Visit: Payer: Self-pay | Admitting: Obstetrics and Gynecology

## 2021-10-18 ENCOUNTER — Encounter (HOSPITAL_COMMUNITY): Payer: Self-pay

## 2021-10-18 VITALS — BP 89/68 | HR 77 | Wt 173.4 lb

## 2021-10-18 DIAGNOSIS — O99343 Other mental disorders complicating pregnancy, third trimester: Secondary | ICD-10-CM

## 2021-10-18 DIAGNOSIS — Z3492 Encounter for supervision of normal pregnancy, unspecified, second trimester: Secondary | ICD-10-CM

## 2021-10-18 DIAGNOSIS — F32A Depression, unspecified: Secondary | ICD-10-CM

## 2021-10-18 DIAGNOSIS — D696 Thrombocytopenia, unspecified: Secondary | ICD-10-CM | POA: Diagnosis not present

## 2021-10-18 DIAGNOSIS — F1994 Other psychoactive substance use, unspecified with psychoactive substance-induced mood disorder: Secondary | ICD-10-CM

## 2021-10-18 DIAGNOSIS — O99112 Other diseases of the blood and blood-forming organs and certain disorders involving the immune mechanism complicating pregnancy, second trimester: Secondary | ICD-10-CM

## 2021-10-18 LAB — CBC
Hematocrit: 31.3 % — ABNORMAL LOW (ref 34.0–46.6)
Hemoglobin: 10.7 g/dL — ABNORMAL LOW (ref 11.1–15.9)
MCH: 30.8 pg (ref 26.6–33.0)
MCHC: 34.2 g/dL (ref 31.5–35.7)
MCV: 90 fL (ref 79–97)
Platelets: 109 10*3/uL — ABNORMAL LOW (ref 150–450)
RBC: 3.47 x10E6/uL — ABNORMAL LOW (ref 3.77–5.28)
RDW: 11.7 % (ref 11.7–15.4)
WBC: 7.7 10*3/uL (ref 3.4–10.8)

## 2021-10-18 NOTE — Telephone Encounter (Signed)
Preadmission screen  

## 2021-10-20 ENCOUNTER — Other Ambulatory Visit: Payer: Self-pay | Admitting: Advanced Practice Midwife

## 2021-10-21 ENCOUNTER — Telehealth (HOSPITAL_COMMUNITY): Payer: Self-pay | Admitting: *Deleted

## 2021-10-21 ENCOUNTER — Encounter (HOSPITAL_COMMUNITY): Payer: Self-pay

## 2021-10-21 NOTE — Telephone Encounter (Signed)
Preadmission screen  

## 2021-10-22 ENCOUNTER — Telehealth (HOSPITAL_COMMUNITY): Payer: Self-pay | Admitting: *Deleted

## 2021-10-22 NOTE — Telephone Encounter (Signed)
Preadmission screen  

## 2021-10-23 ENCOUNTER — Telehealth (HOSPITAL_COMMUNITY): Payer: Self-pay | Admitting: *Deleted

## 2021-10-23 NOTE — Telephone Encounter (Signed)
Preadmission screen  

## 2021-10-24 ENCOUNTER — Telehealth (HOSPITAL_COMMUNITY): Payer: Self-pay | Admitting: *Deleted

## 2021-10-24 NOTE — Telephone Encounter (Signed)
Preadmission screen  

## 2021-10-25 ENCOUNTER — Encounter: Payer: Self-pay | Admitting: Family Medicine

## 2021-10-25 ENCOUNTER — Ambulatory Visit (INDEPENDENT_AMBULATORY_CARE_PROVIDER_SITE_OTHER): Payer: Medicaid Other | Admitting: Family Medicine

## 2021-10-25 VITALS — BP 106/75 | HR 98 | Wt 170.5 lb

## 2021-10-25 DIAGNOSIS — R87810 Cervical high risk human papillomavirus (HPV) DNA test positive: Secondary | ICD-10-CM

## 2021-10-25 DIAGNOSIS — O99112 Other diseases of the blood and blood-forming organs and certain disorders involving the immune mechanism complicating pregnancy, second trimester: Secondary | ICD-10-CM

## 2021-10-25 DIAGNOSIS — R87610 Atypical squamous cells of undetermined significance on cytologic smear of cervix (ASC-US): Secondary | ICD-10-CM

## 2021-10-25 DIAGNOSIS — D696 Thrombocytopenia, unspecified: Secondary | ICD-10-CM

## 2021-10-25 DIAGNOSIS — Z3492 Encounter for supervision of normal pregnancy, unspecified, second trimester: Secondary | ICD-10-CM

## 2021-10-25 NOTE — Patient Instructions (Signed)

## 2021-10-25 NOTE — Progress Notes (Signed)
? ?  Subjective:  ?Megan Rocha is a 31 y.o. [id number] at [redacted]w[redacted]d being seen today for ongoing prenatal care.  She is currently monitored for the following issues for this low-risk pregnancy and has ASCUS with positive high risk HPV cervical; Benign gestational thrombocytopenia in second trimester (HCC); Depression affecting pregnancy in third trimester, antepartum; Major depressive disorder, recurrent episode, moderate (HCC); Generalized anxiety disorder; Substance induced mood disorder (HCC); and Encounter for supervision of low-risk pregnancy in second trimester on their problem list. ? ?Patient reports no complaints.  Contractions: Irregular. Vag. Bleeding: None.  Movement: Present. Denies leaking of fluid.  ? ?The following portions of the patient's history were reviewed and updated as appropriate: allergies, current medications, past family history, past medical history, past social history, past surgical history and problem list. Problem list updated. ? ?Objective:  ? ?Vitals:  ? 10/25/21 1102  ?BP: 106/75  ?Pulse: 98  ?Weight: 170 lb 8 oz (77.3 kg)  ? ? ?Fetal Status: Fetal Heart Rate (bpm): 146   Movement: Present  Presentation: Vertex ? ?General:  Alert, oriented and cooperative. Patient is in no acute distress.  ?Skin: Skin is warm and dry. No rash noted.   ?Cardiovascular: Normal heart rate noted  ?Respiratory: Normal respiratory effort, no problems with respiration noted  ?Abdomen: Soft, gravid, appropriate for gestational age. Pain/Pressure: Present     ?Pelvic: Vag. Bleeding: None     ?Cervical exam performed Dilation: 1.5 Effacement (%): 70 Station: -2  ?Extremities: Normal range of motion.     ?Mental Status: Normal mood and affect. Normal behavior. Normal judgment and thought content.  ? ?Urinalysis:     ? ?Assessment and Plan:  ?Pregnancy: [id number] at [redacted]w[redacted]d ? ?1. Encounter for supervision of low-risk pregnancy in second trimester ?BP and FHR normal ?Cervix 1.5/70/-2, declined membrane strip as she has  a baby shower tomorrow ?Scheduled for IOL in three days ? ?2. Benign gestational thrombocytopenia in second trimester [facility]) ?Lab Results  ?Component Value Date  ? PLT 109 (L) 10/18/2021  ? ? ?3. ASCUS with positive high risk HPV cervical ?Needs PP colpo ? ?Term labor symptoms and general obstetric precautions including but not limited to vaginal bleeding, contractions, leaking of fluid and fetal movement were reviewed in detail with the patient. ?Please refer to After Visit Summary for other counseling recommendations.  ?Return in 1 week (on 11/01/2021) for [facility], ob visit. ? ? ?Venora Maples, MD ? ?

## 2021-10-28 ENCOUNTER — Inpatient Hospital Stay (HOSPITAL_COMMUNITY)
Admission: AD | Admit: 2021-10-28 | Discharge: 2021-10-29 | DRG: 797 | Disposition: A | Discharge status: Home / Self Care | Payer: Medicaid Other | Attending: Obstetrics and Gynecology | Admitting: Obstetrics and Gynecology

## 2021-10-28 ENCOUNTER — Inpatient Hospital Stay (HOSPITAL_COMMUNITY): Payer: Medicaid Other

## 2021-10-28 ENCOUNTER — Encounter (HOSPITAL_COMMUNITY): Payer: Self-pay | Admitting: Obstetrics and Gynecology

## 2021-10-28 ENCOUNTER — Other Ambulatory Visit: Payer: Self-pay

## 2021-10-28 DIAGNOSIS — F32A Depression, unspecified: Secondary | ICD-10-CM | POA: Diagnosis present

## 2021-10-28 DIAGNOSIS — Z3A39 39 weeks gestation of pregnancy: Secondary | ICD-10-CM

## 2021-10-28 DIAGNOSIS — Z880 Allergy status to penicillin: Secondary | ICD-10-CM

## 2021-10-28 DIAGNOSIS — O9081 Anemia of the puerperium: Secondary | ICD-10-CM | POA: Diagnosis not present

## 2021-10-28 DIAGNOSIS — Z9079 Acquired absence of other genital organ(s): Secondary | ICD-10-CM

## 2021-10-28 DIAGNOSIS — O9912 Other diseases of the blood and blood-forming organs and certain disorders involving the immune mechanism complicating childbirth: Secondary | ICD-10-CM | POA: Diagnosis not present

## 2021-10-28 DIAGNOSIS — R87610 Atypical squamous cells of undetermined significance on cytologic smear of cervix (ASC-US): Secondary | ICD-10-CM | POA: Diagnosis present

## 2021-10-28 DIAGNOSIS — O26893 Other specified pregnancy related conditions, third trimester: Secondary | ICD-10-CM | POA: Diagnosis not present

## 2021-10-28 DIAGNOSIS — Z302 Encounter for sterilization: Secondary | ICD-10-CM | POA: Diagnosis not present

## 2021-10-28 DIAGNOSIS — D696 Thrombocytopenia, unspecified: Secondary | ICD-10-CM | POA: Diagnosis present

## 2021-10-28 DIAGNOSIS — Z3492 Encounter for supervision of normal pregnancy, unspecified, second trimester: Secondary | ICD-10-CM

## 2021-10-28 DIAGNOSIS — F411 Generalized anxiety disorder: Secondary | ICD-10-CM | POA: Diagnosis present

## 2021-10-28 DIAGNOSIS — D6959 Other secondary thrombocytopenia: Secondary | ICD-10-CM | POA: Diagnosis not present

## 2021-10-28 DIAGNOSIS — D62 Acute posthemorrhagic anemia: Secondary | ICD-10-CM | POA: Diagnosis not present

## 2021-10-28 DIAGNOSIS — Z3490 Encounter for supervision of normal pregnancy, unspecified, unspecified trimester: Secondary | ICD-10-CM | POA: Insufficient documentation

## 2021-10-28 LAB — CBC
HCT: 31.1 % — ABNORMAL LOW (ref 36.0–46.0)
HCT: 32.1 % — ABNORMAL LOW (ref 36.0–46.0)
Hemoglobin: 10.3 g/dL — ABNORMAL LOW (ref 12.0–15.0)
Hemoglobin: 11.3 g/dL — ABNORMAL LOW (ref 12.0–15.0)
MCH: 31 pg (ref 26.0–34.0)
MCH: 32.1 pg (ref 26.0–34.0)
MCHC: 33.1 g/dL (ref 30.0–36.0)
MCHC: 35.2 g/dL (ref 30.0–36.0)
MCV: 93.7 fL (ref 80.0–100.0)
MCV: 91.2 fL (ref 80.0–100.0)
Platelets: 126 10*3/uL — ABNORMAL LOW (ref 150–400)
Platelets: 116 10*3/uL — ABNORMAL LOW (ref 150–400)
RBC: 3.32 MIL/uL — ABNORMAL LOW (ref 3.87–5.11)
RBC: 3.52 MIL/uL — ABNORMAL LOW (ref 3.87–5.11)
RDW: 12.9 % (ref 11.5–15.5)
RDW: 12.7 % (ref 11.5–15.5)
WBC: 7.4 10*3/uL (ref 4.0–10.5)
WBC: 8.3 10*3/uL (ref 4.0–10.5)
nRBC: 0 % (ref 0.0–0.2)
nRBC: 0 % (ref 0.0–0.2)

## 2021-10-28 LAB — TYPE AND SCREEN
ABO/RH(D): O POS
Antibody Screen: NEGATIVE

## 2021-10-28 LAB — RPR: RPR Ser Ql: NONREACTIVE

## 2021-10-28 MED ORDER — ONDANSETRON HCL 4 MG/2ML IJ SOLN: 4.0000 mg | Freq: Four times a day (QID) | INTRAMUSCULAR | Status: DC | PRN

## 2021-10-28 MED ORDER — LACTATED RINGERS IV SOLN: 500.0000 mL | Freq: Once | INTRAVENOUS | Status: DC

## 2021-10-28 MED ORDER — ACETAMINOPHEN 325 MG PO TABS
650.0000 mg | ORAL_TABLET | ORAL | Status: DC | PRN
  Filled 2021-10-28: qty 2

## 2021-10-28 MED ORDER — LACTATED RINGERS IV SOLN: INTRAVENOUS | Status: DC

## 2021-10-28 MED ORDER — IBUPROFEN 600 MG PO TABS
600.0000 mg | ORAL_TABLET | Freq: Four times a day (QID) | ORAL | Status: DC
  Administered 2021-10-28 – 2021-10-29 (×3): 600 mg via ORAL
  Filled 2021-10-28 (×4): qty 1

## 2021-10-28 MED ORDER — PHENYLEPHRINE 80 MCG/ML (10ML) SYRINGE FOR IV PUSH (FOR BLOOD PRESSURE SUPPORT): 80.0000 ug | PREFILLED_SYRINGE | INTRAVENOUS | Status: DC | PRN

## 2021-10-28 MED ORDER — DIPHENHYDRAMINE HCL 25 MG PO CAPS: 25.0000 mg | ORAL_CAPSULE | Freq: Four times a day (QID) | ORAL | Status: DC | PRN

## 2021-10-28 MED ORDER — OXYTOCIN BOLUS FROM INFUSION
333.0000 mL | Freq: Once | INTRAVENOUS | Status: AC
  Administered 2021-10-28: 333 mL via INTRAVENOUS

## 2021-10-28 MED ORDER — TETANUS-DIPHTH-ACELL PERTUSSIS 5-2.5-18.5 LF-MCG/0.5 IM SUSY: 0.5000 mL | PREFILLED_SYRINGE | Freq: Once | INTRAMUSCULAR | Status: DC

## 2021-10-28 MED ORDER — EPHEDRINE 5 MG/ML INJ: 10.0000 mg | INTRAVENOUS | Status: DC | PRN

## 2021-10-28 MED ORDER — WITCH HAZEL-GLYCERIN EX PADS: 1.0000 "application " | MEDICATED_PAD | CUTANEOUS | Status: DC | PRN

## 2021-10-28 MED ORDER — FENTANYL-BUPIVACAINE-NACL 0.5-0.125-0.9 MG/250ML-% EP SOLN
12.0000 mL/h | EPIDURAL | Status: DC | PRN
  Filled 2021-10-28: qty 250

## 2021-10-28 MED ORDER — DIPHENHYDRAMINE HCL 50 MG/ML IJ SOLN: 12.5000 mg | INTRAMUSCULAR | Status: DC | PRN

## 2021-10-28 MED ORDER — FENTANYL CITRATE (PF) 100 MCG/2ML IJ SOLN: 50.0000 ug | INTRAMUSCULAR | Status: DC | PRN

## 2021-10-28 MED ORDER — ZOLPIDEM TARTRATE 5 MG PO TABS: 5.0000 mg | ORAL_TABLET | Freq: Every evening | ORAL | Status: DC | PRN

## 2021-10-28 MED ORDER — OXYCODONE-ACETAMINOPHEN 5-325 MG PO TABS: 1.0000 | ORAL_TABLET | ORAL | Status: DC | PRN

## 2021-10-28 MED ORDER — SOD CITRATE-CITRIC ACID 500-334 MG/5ML PO SOLN
30.0000 mL | ORAL | Status: DC | PRN
Start: 2021-10-28 — End: 2021-10-28

## 2021-10-28 MED ORDER — TERBUTALINE SULFATE 1 MG/ML IJ SOLN: 0.2500 mg | Freq: Once | INTRAMUSCULAR | Status: DC | PRN

## 2021-10-28 MED ORDER — ONDANSETRON HCL 4 MG PO TABS: 4.0000 mg | ORAL_TABLET | ORAL | Status: DC | PRN

## 2021-10-28 MED ORDER — SENNOSIDES-DOCUSATE SODIUM 8.6-50 MG PO TABS: 2.0000 | ORAL_TABLET | Freq: Every day | ORAL | Status: DC

## 2021-10-28 MED ORDER — ACETAMINOPHEN 325 MG PO TABS
650.0000 mg | ORAL_TABLET | ORAL | Status: DC | PRN
  Administered 2021-10-28: 650 mg via ORAL
  Filled 2021-10-28: qty 2

## 2021-10-28 MED ORDER — LACTATED RINGERS IV SOLN: 500.0000 mL | INTRAVENOUS | Status: DC | PRN

## 2021-10-28 MED ORDER — COCONUT OIL OIL: 1.0000 "application " | TOPICAL_OIL | Status: DC | PRN

## 2021-10-28 MED ORDER — OXYTOCIN-SODIUM CHLORIDE 30-0.9 UT/500ML-% IV SOLN: 2.5000 [IU]/h | INTRAVENOUS | Status: DC

## 2021-10-28 MED ORDER — LIDOCAINE HCL (PF) 1 % IJ SOLN: 30.0000 mL | INTRAMUSCULAR | Status: DC | PRN

## 2021-10-28 MED ORDER — SIMETHICONE 80 MG PO CHEW
80.0000 mg | CHEWABLE_TABLET | ORAL | Status: DC | PRN
Start: 2021-10-28 — End: 2021-10-30

## 2021-10-28 MED ORDER — PRENATAL MULTIVITAMIN CH
1.0000 | ORAL_TABLET | Freq: Every day | ORAL | Status: DC
  Administered 2021-10-29: 1 via ORAL
  Filled 2021-10-28: qty 1

## 2021-10-28 MED ORDER — BENZOCAINE-MENTHOL 20-0.5 % EX AERO
1.0000 "application " | INHALATION_SPRAY | CUTANEOUS | Status: DC | PRN
  Filled 2021-10-28: qty 56

## 2021-10-28 MED ORDER — OXYTOCIN-SODIUM CHLORIDE 30-0.9 UT/500ML-% IV SOLN
1.0000 m[IU]/min | INTRAVENOUS | Status: DC
  Administered 2021-10-28: 2 m[IU]/min via INTRAVENOUS
  Filled 2021-10-28: qty 500

## 2021-10-28 MED ORDER — OXYCODONE-ACETAMINOPHEN 5-325 MG PO TABS: 2.0000 | ORAL_TABLET | ORAL | Status: DC | PRN

## 2021-10-28 MED ORDER — ONDANSETRON HCL 4 MG/2ML IJ SOLN: 4.0000 mg | INTRAMUSCULAR | Status: DC | PRN

## 2021-10-28 MED ORDER — DIBUCAINE (PERIANAL) 1 % EX OINT: 1.0000 "application " | TOPICAL_OINTMENT | CUTANEOUS | Status: DC | PRN

## 2021-10-28 NOTE — Progress Notes (Signed)
Megan Rocha is a 31 y.o. [id number] at [redacted]w[redacted]d admitted for IOL at term ? ?Subjective: ?Patient feeling more painful questions. She is requesting an epidural at this time ? ?Objective: ?BP 120/72   Pulse 63   Temp 98.5 ?F (36.9 ?C) (Axillary)   Resp 18   Ht 5\' 9"  (1.753 m)   Wt 78.9 kg   LMP 01/24/2021   BMI 25.70 kg/m?  ?No intake/output data recorded. ?No intake/output data recorded. ? ?FHT: 120 bpm, moderate variability, +15x15 accels, occasional early decels ?UC:  Q 2-3 mins ?SVE:   Dilation: 6 ?Effacement (%): 90 ?Station: 0, -1 ?Exam by:: [email or web address], CNM ? ?Labs: ?Lab Results  ?Component Value Date  ? WBC 7.4 10/28/2021  ? HGB 10.3 (L) 10/28/2021  ? HCT 31.1 (L) 10/28/2021  ? MCV 93.7 10/28/2021  ? PLT 126 (L) 10/28/2021  ? ? ?Assessment / Plan: ?Megan Rocha is a 31 y.o. 29 at [redacted]w[redacted]d admitted for IOL ? ?Labor: Progressing. ?Fetal Wellbeing:  Category I ?Pain Control:  Requesting epidural ?I/D:  GBS negative ?Anticipated MOD:  NSVD ? ? ?[redacted]w[redacted]d, CNM ?10/28/2021, 5:11 PM ? ? ?

## 2021-10-28 NOTE — Progress Notes (Signed)
Megan Rocha is a 31 y.o. [id number] at [redacted]w[redacted]d admitted for IOL at term ? ?Subjective: ?Patient reports no pain. Mild cramping occasionally. ? ?Objective: ?BP 115/70   Pulse 69   Temp 98.2 ?F (36.8 ?C) (Oral)   Resp 18   Ht 5\' 9"  (1.753 m)   Wt 78.9 kg   LMP 01/24/2021   BMI 25.70 kg/m?  ?No intake/output data recorded. ?No intake/output data recorded. ? ?FHT: 125 bpm, moderate variability, +15x15 accels, no decels ?UC:  Q 9 mins ?SVE:   Dilation: 4 ?Effacement (%): 70 ?Station: -2 ?Exam by:: [email or web address], CNM ? ?Labs: ?Lab Results  ?Component Value Date  ? WBC 7.4 10/28/2021  ? HGB 10.3 (L) 10/28/2021  ? HCT 31.1 (L) 10/28/2021  ? MCV 93.7 10/28/2021  ? PLT 126 (L) 10/28/2021  ? ? ?Assessment / Plan: ?Megan Rocha is a 31 y.o. 26 at [redacted]w[redacted]d admitted for IOL ? ?Labor: Will start low dose Pit and titrate prn ?Fetal Wellbeing:  Category I ?Pain Control:  Desires unmedicated  ?I/D:  GBS negative ?Anticipated MOD:  NSVD ? ? ?[redacted]w[redacted]d, CNM ?10/28/2021, 1:04 PM ? ? ?

## 2021-10-28 NOTE — Discharge Summary (Signed)
? ?  Postpartum Discharge Summary ? ? ?Patient Name: Megan Rocha ?DOB: 12-14-1990 ?MRN: [id number] ? ?Date of admission: 10/28/2021 ?Delivery date:10/28/2021  ?Delivering provider: Maryagnes Amos L  ?Date of discharge: 10/29/2021 ? ?Admitting diagnosis: Pregnancy [Z34.90] ?Intrauterine pregnancy: [redacted]w[redacted]d    ?Secondary diagnosis:  Principal Problem: ?  SVD (spontaneous vaginal delivery) ?Active Problems: ?  ASCUS with positive high risk HPV cervical ?  Benign gestational thrombocytopenia in second trimester ([facility] ?  Depression affecting pregnancy in third trimester, antepartum ?  Generalized anxiety disorder ?  Encounter for supervision of low-risk pregnancy in second trimester ?  Status post bilateral salpingectomy ? ?Additional problems: Acute blood loss anemia; mild - asymptomatic     ?Discharge diagnosis: Term Pregnancy Delivered                                              ?Post partum procedures: Bilateral salpingectomy  ?Augmentation: AROM and Pitocin ?Complications: None ? ?Hospital course: Induction of Labor With Vaginal Delivery   ?31y.o. yo [id number] admitted to the hospital 10/28/2021 for induction of labor.  Indication for induction:  Elective at term .  Patient had an uncomplicated labor course as follows: ?Membrane Rupture Time/Date: 9:29 AM ,03/21/2022   ?Delivery Method:Vaginal, Spontaneous  ?Episiotomy: None  ?Lacerations:  None  ?Details of delivery can be found in separate delivery note.  Patient had a routine postpartum course.  Her hemoglobin on PPD#1 was 9.4, down from 11.3.  She remained asymptomatic from this while inpatient.  She had a bilateral salpingectomy performed on PPD#1 without complication.  She is eating, drinking, voiding, and ambulating without issue.  Her pain and bleeding are controlled.  She is breastfeeding well.  Patient is discharged home 10/29/21. ? ?Newborn Data: ?Birth date:10/28/2021  ?Birth time:6:17 PM  ?Gender:Female  ?Living status:Living  ?Apgars:8 ,9   ?Weight:2930 g  ? ?Magnesium Sulfate received: No ?BMZ received: No ?Rhophylac: N/A ?MMR: N/A ?T-DaP: Offered prior to discharge ?Flu: N/A ?Transfusion: No ? ?Physical exam  ?Vitals:  ? 10/29/21 1415 10/29/21 1430 10/29/21 1454 10/29/21 1608  ?BP: 112/67 116/74 125/81 116/83  ?Pulse: 60 63 63 61  ?Resp: '18 16 20 16  ' ?Temp:  98.6 ?F (37 ?C) 97.8 ?F (36.6 ?C) 98.7 ?F (37.1 ?C)  ?TempSrc:  Oral Oral Oral  ?SpO2: 99% 100% 100% 100%  ?Weight:      ?Height:      ? ?Labs: ?Lab Results  ?Component Value Date  ? WBC 8.7 10/29/2021  ? HGB 9.4 (L) 10/29/2021  ? HCT 27.7 (L) 10/29/2021  ? MCV 93.3 10/29/2021  ? PLT 122 (L) 10/29/2021  ? ? ?  Latest Ref Rng & Units 12/08/2020  ?  2:58 PM  ?CMP  ?Glucose 70 - 99 mg/dL 90    ?BUN 6 - 20 mg/dL 10    ?Creatinine 0.44 - 1.00 mg/dL 0.74    ?Sodium 135 - 145 mmol/L 137    ?Potassium 3.5 - 5.1 mmol/L 4.6    ?Chloride 98 - 111 mmol/L 104    ?CO2 22 - 32 mmol/L 28    ?Calcium 8.9 - 10.3 mg/dL 9.0    ? ?Edinburgh Score: ? ?  03/19/2020  ?  2:55 PM  ?Edinburgh Postnatal Depression Scale Screening Tool  ?I have been able to laugh and see the funny side of things. 0  ?  I have looked forward with enjoyment to things. 0  ?I have blamed myself unnecessarily when things went wrong. 1  ?I have been anxious or worried for no good reason. 2  ?I have felt scared or panicky for no good reason. 2  ?Things have been getting on top of me. 0  ?I have been so unhappy that I have had difficulty sleeping. 2  ?I have felt sad or miserable. 2  ?I have been so unhappy that I have been crying. 3  ?The thought of harming myself has occurred to me. 0  ?Edinburgh Postnatal Depression Scale Total 12  ? ? ? ?After visit meds:  ?Allergies as of 10/29/2021   ? ?   Reactions  ? Penicillins Anaphylaxis  ? ?  ? ?  ?Medication List  ?  ? ?STOP taking these medications   ? ?hydrocortisone 25 MG suppository ?Commonly known as: ANUSOL-HC ?  ? ?  ? ?TAKE these medications   ? ?acetaminophen 500 MG tablet ?Commonly known as:  TYLENOL ?Take 2 tablets (1,000 mg total) by mouth every 8 (eight) hours as needed (pain). ?  ?ibuprofen 600 MG tablet ?Commonly known as: ADVIL ?Take 1 tablet (600 mg total) by mouth every 6 (six) hours as needed (pain). ?  ?oxyCODONE 5 MG immediate release tablet ?Commonly known as: Oxy IR/ROXICODONE ?Take 1 tablet (5 mg total) by mouth every 6 (six) hours as needed for severe pain or breakthrough pain. ?  ?Prenatal 27-1 MG Tabs ?Take 1 tablet by mouth daily. ?  ? ?  ? ?  ?  ? ? ?  ?Discharge Care Instructions  ?(From admission, onward)  ?  ? ? ?  ? ?  Start     Ordered  ? 10/29/21 0000  Discharge wound care:       ?Comments: Remove dressing 5 days after your surgery date.  ? 10/29/21 1825  ? ?  ?  ? ?  ? ? ? ?Discharge home in stable condition ?Infant Feeding: Breast ?Infant Disposition: home with mother ?Discharge instruction: per After Visit Summary and Postpartum booklet. ?Activity: Advance as tolerated. Pelvic rest for 6 weeks.  ?Diet: routine diet ?Future Appointments: ?Future Appointments  ?Date Time Provider [place]  ?11/12/2021  2:15 PM [place] [facility]  ?12/10/2021 10:55 AM Dione Plover Annice Needy, MD [facility]  ? ?Follow up Visit: ?Please schedule this patient for a In person postpartum visit in 4 weeks with the following provider: Any provider. ?Additional Postpartum F/U: Postpartum Depression checkup and Colpo  ?Low risk pregnancy complicated by:  Gestational thrombocytopenia ?Delivery mode:  Vaginal, Spontaneous  ?Anticipated Birth Control:  BTL done PP ? ?10/29/2021 ?Genia Del, MD ? ? ? ?

## 2021-10-28 NOTE — H&P (Signed)
OBSTETRIC ADMISSION HISTORY AND PHYSICAL ? ?Megan Rocha is a 31 y.o. female [phone] with IUP at [redacted]w[redacted]d by LMP presenting for IOL. She reports +FMs, No LOF, no VB, no blurry vision, headaches or peripheral edema, and RUQ pain.  She plans on breast feeding. She request BTL for birth control. ?She received her prenatal care at  [facility]   ? ?Dating: By LMP --->  Estimated Date of Delivery: 10/31/21 ? ?Sono:   ? ?@[redacted]w[redacted]d , CWD, normal anatomy, transverse presentation, anterior placenta, 601g, 38% EFW ? ?@[redacted]w[redacted]d , CWD, normal anatomy, cephalic presentation, anterior placenta, 1044g, 17% EFW ? ?Prenatal History/Complications:  ?- Gestational thrombocytopenia ?- ASCUS ?- Anxiety/depression ? ?Past Medical History: ?Past Medical History:  ?Diagnosis Date  ? BV (bacterial vaginosis)   ? Medical history non-contributory   ? PONV (postoperative nausea and vomiting)   ? from epidural  ? UTI (urinary tract infection)   ? Yeast vaginitis   ? ? ?Past Surgical History: ?Past Surgical History:  ?Procedure Laterality Date  ? colposcopy    ? NO PAST SURGERIES    ? ? ?Obstetrical History: ?OB History   ? ? Gravida  ?4  ? Para  ?2  ? Term  ?2  ? Preterm  ?   ? AB  ?1  ? Living  ?2  ?  ? ? SAB  ?1  ? IAB  ?   ? Ectopic  ?   ? Multiple  ?0  ? Live Births  ?2  ?   ?  ?  ? ? ?Social History ?Social History  ? ?Socioeconomic History  ? Marital status: Single  ?  Spouse name: Not on file  ? Number of children: Not on file  ? Years of education: Not on file  ? Highest education level: Not on file  ?Occupational History  ? Not on file  ?Tobacco Use  ? Smoking status: Never  ? Smokeless tobacco: Never  ?Vaping Use  ? Vaping Use: Never used  ?Substance and Sexual Activity  ? Alcohol use: Not Currently  ?  Comment: occasional   ? Drug use: Not Currently  ?  Types: Marijuana  ?  Comment: used daily until + pregnancy  ? Sexual activity: Yes  ?  Birth control/protection: None  ?Other Topics Concern  ? Not on file  ?Social History Narrative  ? Not on file   ? ?Social Determinants of Health  ? ?Financial Resource Strain: Not on file  ?Food Insecurity: No Food Insecurity  ? Worried About Charity fundraiser in the Last Year: Never true  ? Ran Out of Food in the Last Year: Never true  ?Transportation Needs: No Transportation Needs  ? Lack of Transportation (Medical): No  ? Lack of Transportation (Non-Medical): No  ?Physical Activity: Not on file  ?Stress: Not on file  ?Social Connections: Not on file  ? ? ?Family History: ?Family History  ?Problem Relation Age of Onset  ? Healthy Mother   ? Healthy Father   ? Breast cancer Paternal Grandmother   ? ? ?Allergies: ?Allergies  ?Allergen Reactions  ? Penicillins Anaphylaxis  ? ? ?Medications Prior to Admission  ?Medication Sig Dispense Refill Last Dose  ? hydrocortisone (ANUSOL-HC) 25 MG suppository Place 1 suppository (25 mg total) rectally 2 (two) times daily. 12 suppository 1   ? Prenatal 27-1 MG TABS Take 1 tablet by mouth daily. 30 tablet 12   ? ?Review of Systems  ? ?All systems reviewed and negative except as stated in HPI ? ?  Blood pressure 107/65, pulse 86, temperature (!) 97.5 ?F (36.4 ?C), temperature source Oral, resp. rate 17, height 5\' 9"  (1.753 m), weight 78.9 kg, last menstrual period 01/24/2021, unknown if currently breastfeeding. ?General appearance: alert, cooperative, and no distress ?Lungs: effort normal ?Heart: regular rate  ?Abdomen: soft, non-tender; gravid ?Pelvic: adequate ?Extremities: no sign of DVT ?Presentation: cephalic ?Fetal monitoring: 135 bpm, moderate variability, +15x15 accels, no decels ?Uterine activity: irregular ?Dilation: 4 ?Effacement (%): 70 ?Station: -2 ?Exam by:: Moshe Cipro, CNM ? ?Prenatal labs: ?ABO, Rh: --/--/O POS (05/08 [id number]) ?Antibody: NEG (05/08 0850) ?Rubella: 1.17 (12/21 1559) ?RPR: Non Reactive (02/15 [id number])  ?HBsAg: Negative (12/21 1559)  ?HIV: Non Reactive (02/15 [id number])  ?GBS: Negative/-- (04/21 1117)  ?2 hr Glucola: 75/67/69 ?Genetic screening: declined ?Anatomy US:  normal ? ?Prenatal Transfer Tool  ?Maternal Diabetes: No ?Genetic Screening: Declined ?Maternal Ultrasounds/Referrals: Normal ?Fetal Ultrasounds or other Referrals:  None ?Maternal Substance Abuse:  No ?Significant Maternal Medications:  None ?Significant Maternal Lab Results: Group B Strep negative ? ?Results for orders placed or performed during the hospital encounter of 10/28/21 (from the past 24 hour(s))  ?CBC  ? Collection Time: 10/28/21  8:50 AM  ?Result Value Ref Range  ? WBC 7.4 4.0 - 10.5 K/uL  ? RBC 3.32 (L) 3.87 - 5.11 MIL/uL  ? Hemoglobin 10.3 (L) 12.0 - 15.0 g/dL  ? HCT 31.1 (L) 36.0 - 46.0 %  ? MCV 93.7 80.0 - 100.0 fL  ? MCH 31.0 26.0 - 34.0 pg  ? MCHC 33.1 30.0 - 36.0 g/dL  ? RDW 12.9 11.5 - 15.5 %  ? Platelets 126 (L) 150 - 400 K/uL  ? nRBC 0.0 0.0 - 0.2 %  ?Type and screen  ? Collection Time: 10/28/21  8:50 AM  ?Result Value Ref Range  ? ABO/RH(D) O POS   ? Antibody Screen NEG   ? Sample Expiration    ?  10/31/2021,2359 ?Performed at [facility], [place] [address], [place], [place] [address] ?  ? ? ?Patient Active Problem List  ? Diagnosis Date Noted  ? Pregnancy 10/28/2021  ? Encounter for supervision of low-risk pregnancy in second trimester 06/12/2021  ? Substance induced mood disorder ([place]) 09/06/2020  ? Major depressive disorder, recurrent episode, moderate ([place]) 08/01/2020  ? Generalized anxiety disorder 08/01/2020  ? Depression affecting pregnancy in third trimester, antepartum 12/14/2019  ? Benign gestational thrombocytopenia in second trimester ([place]) 11/17/2019  ? ASCUS with positive high risk HPV cervical 10/18/2019  ? ? ?Assessment/Plan:  ?Megan Rocha is a 31 y.o. [id number] at [redacted]w[redacted]d here for IOL ? ?#Labor: Discussed R/B/A to AROM. Patient gave verbal consent. AROM with moderate amount of clear fluid. Patient and fetus tolerated procedure well. Will reassess in 4 hours. Plan for Pitocin prn.  ?#Pain: Prn ?#FWB: Cat 1 ?#ID: GBS neg ?#MOF: Breast ?#MOC: BTL, papers signed 08/28/21 ?#Circ:  Yes, desires inpatient ? ? ?Renee Harder, CNM  ?10/28/2021, 10:07 AM ? ? ? ?

## 2021-10-28 NOTE — Lactation Note (Signed)
This note was copied from a baby's chart. ?Lactation Consultation Note ? ?Patient Name: Megan Rocha ?Today's Date: 10/28/2021 ?Reason for consult: L&D Initial assessment;Term ?Age:29 hours ? ? ?Initial L&D Consult: ? ?Visited with family < 1 hour after birth ?Mother had baby latched; wide gape and flanged lips.  Mother denied pain.  He was actively sucking.  Reassured parents that lactation services will be available on the M/B unit.  Allowed time for family bonding.  Father present. ? ? ?Maternal Data ?  ? ?Feeding ?Mother's Current Feeding Choice: Breast Milk ? ?LATCH Score ?Latch: Grasps breast easily, tongue down, lips flanged, rhythmical sucking. ? ?Audible Swallowing: None ? ?Type of Nipple:  (Unable to assess due to baby being latched prior to my arrival) ? ?Comfort (Breast/Nipple): Soft / non-tender ? ?Hold (Positioning): No assistance needed to correctly position infant at breast. ? ?  ? ? ?Lactation Tools Discussed/Used ?  ? ?Interventions ?Interventions: Skin to skin ? ?Discharge ?  ? ?Consult Status ?Consult Status: Follow-up from L&D ? ? ? ?Denicia Pagliarulo R Marlette Curvin ?10/28/2021, 6:53 PM ? ? ? ?

## 2021-10-29 ENCOUNTER — Inpatient Hospital Stay (HOSPITAL_COMMUNITY): Payer: Medicaid Other | Admitting: Anesthesiology

## 2021-10-29 ENCOUNTER — Other Ambulatory Visit: Payer: Self-pay

## 2021-10-29 ENCOUNTER — Encounter (HOSPITAL_COMMUNITY): Payer: Self-pay | Admitting: Obstetrics and Gynecology

## 2021-10-29 ENCOUNTER — Encounter (HOSPITAL_COMMUNITY)
Admission: AD | Disposition: A | Discharge status: Home / Self Care | Payer: Self-pay | Source: Home / Self Care | Attending: Obstetrics and Gynecology

## 2021-10-29 DIAGNOSIS — Z9079 Acquired absence of other genital organ(s): Secondary | ICD-10-CM

## 2021-10-29 DIAGNOSIS — Z302 Encounter for sterilization: Secondary | ICD-10-CM | POA: Diagnosis not present

## 2021-10-29 HISTORY — PX: TUBAL LIGATION: SHX77

## 2021-10-29 LAB — CBC
HCT: 27.7 % — ABNORMAL LOW (ref 36.0–46.0)
Hemoglobin: 9.4 g/dL — ABNORMAL LOW (ref 12.0–15.0)
MCH: 31.6 pg (ref 26.0–34.0)
MCHC: 33.9 g/dL (ref 30.0–36.0)
MCV: 93.3 fL (ref 80.0–100.0)
Platelets: 122 10*3/uL — ABNORMAL LOW (ref 150–400)
RBC: 2.97 MIL/uL — ABNORMAL LOW (ref 3.87–5.11)
RDW: 12.8 % (ref 11.5–15.5)
WBC: 8.7 10*3/uL (ref 4.0–10.5)
nRBC: 0 % (ref 0.0–0.2)

## 2021-10-29 SURGERY — LIGATION, FALLOPIAN TUBE, POSTPARTUM
Anesthesia: Spinal

## 2021-10-29 MED ORDER — DEXMEDETOMIDINE (PRECEDEX) IN NS 20 MCG/5ML (4 MCG/ML) IV SYRINGE
PREFILLED_SYRINGE | INTRAVENOUS | Status: DC | PRN
  Administered 2021-10-29: 8 ug via INTRAVENOUS
  Administered 2021-10-29: 4 ug via INTRAVENOUS

## 2021-10-29 MED ORDER — ACETAMINOPHEN 10 MG/ML IV SOLN
INTRAVENOUS | Status: AC
  Filled 2021-10-29: qty 100

## 2021-10-29 MED ORDER — LACTATED RINGERS IV SOLN: INTRAVENOUS | Status: DC

## 2021-10-29 MED ORDER — HYDROMORPHONE HCL 1 MG/ML IJ SOLN: 0.2500 mg | INTRAMUSCULAR | Status: DC | PRN

## 2021-10-29 MED ORDER — ONDANSETRON HCL 4 MG/2ML IJ SOLN: 4.0000 mg | Freq: Once | INTRAMUSCULAR | Status: DC | PRN

## 2021-10-29 MED ORDER — BUPIVACAINE HCL (PF) 0.25 % IJ SOLN
INTRAMUSCULAR | Status: DC | PRN
  Administered 2021-10-29: 20 mL

## 2021-10-29 MED ORDER — OXYCODONE HCL 5 MG PO TABS
5.0000 mg | ORAL_TABLET | ORAL | Status: DC | PRN
Start: 2021-10-29 — End: 2021-10-30
  Administered 2021-10-29: 5 mg via ORAL
  Filled 2021-10-29: qty 1

## 2021-10-29 MED ORDER — DEXMEDETOMIDINE (PRECEDEX) IN NS 20 MCG/5ML (4 MCG/ML) IV SYRINGE
PREFILLED_SYRINGE | INTRAVENOUS | Status: AC
  Filled 2021-10-29: qty 5

## 2021-10-29 MED ORDER — OXYCODONE HCL 5 MG/5ML PO SOLN: 5.0000 mg | Freq: Once | ORAL | Status: DC | PRN

## 2021-10-29 MED ORDER — BUPIVACAINE IN DEXTROSE 0.75-8.25 % IT SOLN
INTRATHECAL | Status: DC | PRN
  Administered 2021-10-29: 11.25 mg via INTRATHECAL

## 2021-10-29 MED ORDER — METOCLOPRAMIDE HCL 10 MG PO TABS
10.0000 mg | ORAL_TABLET | Freq: Once | ORAL | Status: AC
  Administered 2021-10-29: 10 mg via ORAL
  Filled 2021-10-29: qty 1

## 2021-10-29 MED ORDER — KETOROLAC TROMETHAMINE 30 MG/ML IJ SOLN: 30.0000 mg | Freq: Once | INTRAMUSCULAR | Status: DC | PRN

## 2021-10-29 MED ORDER — OXYCODONE HCL 5 MG PO TABS: 5.0000 mg | ORAL_TABLET | Freq: Once | ORAL | Status: DC | PRN

## 2021-10-29 MED ORDER — BUPIVACAINE HCL (PF) 0.25 % IJ SOLN
INTRAMUSCULAR | Status: AC
  Filled 2021-10-29: qty 30

## 2021-10-29 MED ORDER — ACETAMINOPHEN 500 MG PO TABS: 1000.0000 mg | ORAL_TABLET | Freq: Three times a day (TID) | ORAL | 0 refills | Status: DC | PRN

## 2021-10-29 MED ORDER — IBUPROFEN 600 MG PO TABS: 600.0000 mg | ORAL_TABLET | Freq: Four times a day (QID) | ORAL | 0 refills | Status: DC | PRN

## 2021-10-29 MED ORDER — OXYCODONE HCL 5 MG PO TABS: 5.0000 mg | ORAL_TABLET | Freq: Four times a day (QID) | ORAL | 0 refills | Status: DC | PRN

## 2021-10-29 MED ORDER — STERILE WATER FOR IRRIGATION IR SOLN
Status: DC | PRN
  Administered 2021-10-29: 1000 mL

## 2021-10-29 MED ORDER — LACTATED RINGERS IV SOLN
INTRAVENOUS | Status: DC | PRN
Start: 2021-10-29 — End: 2021-10-29

## 2021-10-29 MED ORDER — ONDANSETRON HCL 4 MG/2ML IJ SOLN
INTRAMUSCULAR | Status: AC
  Filled 2021-10-29: qty 2

## 2021-10-29 MED ORDER — ONDANSETRON HCL 4 MG/2ML IJ SOLN
INTRAMUSCULAR | Status: DC | PRN
  Administered 2021-10-29: 4 mg via INTRAVENOUS

## 2021-10-29 MED ORDER — FAMOTIDINE 20 MG PO TABS
40.0000 mg | ORAL_TABLET | Freq: Once | ORAL | Status: AC
  Administered 2021-10-29: 40 mg via ORAL
  Filled 2021-10-29: qty 2

## 2021-10-29 MED ORDER — ACETAMINOPHEN 10 MG/ML IV SOLN
INTRAVENOUS | Status: DC | PRN
  Administered 2021-10-29: 1000 mg via INTRAVENOUS

## 2021-10-29 SURGICAL SUPPLY — 24 items
BLADE SURG 11 STRL SS (BLADE) ×2 IMPLANT
DERMABOND ADVANCED (GAUZE/BANDAGES/DRESSINGS) ×1
DERMABOND ADVANCED .7 DNX12 (GAUZE/BANDAGES/DRESSINGS) IMPLANT
DRSG OPSITE POSTOP 3X4 (GAUZE/BANDAGES/DRESSINGS) ×2 IMPLANT
DURAPREP 26ML APPLICATOR (WOUND CARE) ×2 IMPLANT
ELECT REM PT RETURN 9FT ADLT (ELECTROSURGICAL) ×2
ELECTRODE REM PT RTRN 9FT ADLT (ELECTROSURGICAL) IMPLANT
GLOVE BIOGEL PI IND STRL 7.0 (GLOVE) ×1 IMPLANT
GLOVE BIOGEL PI IND STRL 7.5 (GLOVE) ×1 IMPLANT
GLOVE BIOGEL PI INDICATOR 7.0 (GLOVE) ×1
GLOVE BIOGEL PI INDICATOR 7.5 (GLOVE) ×1
GLOVE ECLIPSE 7.5 STRL STRAW (GLOVE) ×2 IMPLANT
GOWN STRL REUS W/TWL LRG LVL3 (GOWN DISPOSABLE) ×4 IMPLANT
HIBICLENS CHG 4% 4OZ BTL (MISCELLANEOUS) ×2 IMPLANT
NEEDLE HYPO 22GX1.5 SAFETY (NEEDLE) ×2 IMPLANT
NS IRRIG 1000ML POUR BTL (IV SOLUTION) ×2 IMPLANT
PACK ABDOMINAL MINOR (CUSTOM PROCEDURE TRAY) ×2 IMPLANT
PROTECTOR NERVE ULNAR (MISCELLANEOUS) ×2 IMPLANT
SPONGE LAP 4X18 RFD (DISPOSABLE) IMPLANT
SUT VICRYL 0 UR6 27IN ABS (SUTURE) ×2 IMPLANT
SUT VICRYL 4-0 PS2 18IN ABS (SUTURE) ×2 IMPLANT
SYR CONTROL 10ML LL (SYRINGE) ×2 IMPLANT
TOWEL OR 17X24 6PK STRL BLUE (TOWEL DISPOSABLE) ×4 IMPLANT
TRAY FOLEY W/BAG SLVR 14FR (SET/KITS/TRAYS/PACK) ×2 IMPLANT

## 2021-10-29 NOTE — Anesthesia Preprocedure Evaluation (Addendum)
Anesthesia Evaluation  ?Patient identified by MRN, date of birth, ID band ?Patient awake ? ? ? ?Reviewed: ?Allergy & Precautions, NPO status , Patient's Chart, lab work & pertinent test results ? ?Airway ?Mallampati: II ? ?TM Distance: >3 FB ?Neck ROM: Full ? ? ? Dental ?no notable dental hx. ?(+) Teeth Intact, Dental Advisory Given ?  ?Pulmonary ?neg pulmonary ROS,  ?  ?Pulmonary exam normal ?breath sounds clear to auscultation ? ? ? ? ? ? Cardiovascular ?Exercise Tolerance: Good ?Normal cardiovascular exam ?Rhythm:Regular Rate:Normal ? ? ?  ?Neuro/Psych ?Anxiety negative neurological ROS ?   ? GI/Hepatic ?negative GI ROS, Neg liver ROS,   ?Endo/Other  ?negative endocrine ROS ? Renal/GU ?negative Renal ROS  ? ?  ?Musculoskeletal ?negative musculoskeletal ROS ?(+)  ? Abdominal ?  ?Peds ? Hematology ? ?(+) Blood dyscrasia, , Lab Results ?     Component                Value               Date                 ?     WBC                      8.7                 10/29/2021           ?     HGB                      9.4 (L)             10/29/2021           ?     HCT                      27.7 (L)            10/29/2021           ?     PLT                      122 (L)             10/29/2021           ?   ?Anesthesia Other Findings ?ALL: PCN ? Reproductive/Obstetrics ? ?  ? ? ? ? ? ? ? ? ? ? ? ? ? ?  ?  ? ? ? ? ? ? ? ?Anesthesia Physical ?Anesthesia Plan ? ?ASA: 2 ? ?Anesthesia Plan: Spinal  ? ?Post-op Pain Management: Regional block*  ? ?Induction:  ? ?PONV Risk Score and Plan: 3 and Treatment may vary due to age or medical condition and Ondansetron ? ?Airway Management Planned: Natural Airway and Nasal Cannula ? ?Additional Equipment:  ? ?Intra-op Plan:  ? ?Post-operative Plan:  ? ?Informed Consent: I have reviewed the patients History and Physical, chart, labs and discussed the procedure including the risks, benefits and alternatives for the proposed anesthesia with the patient or authorized  representative who has indicated his/her understanding and acceptance.  ? ? ? ?Dental advisory given ? ?Plan Discussed with: CRNA and Anesthesiologist ? ?Anesthesia Plan Comments: (g4p3 w gest Thrombocytopenia (122) for PPTL)  ? ? ? ? ? ? ?Anesthesia Quick Evaluation ? ?

## 2021-10-29 NOTE — Anesthesia Procedure Notes (Signed)
Spinal ? ?Patient location during procedure: OR ?Start time: 10/29/2021 12:40 PM ?End time: 10/29/2021 12:43 PM ?Reason for block: surgical anesthesia ?Staffing ?Performed: anesthesiologist  ?Anesthesiologist: Trevor Iha, MD ?Preanesthetic Checklist ?Completed: patient identified, IV checked, site marked, risks and benefits discussed, surgical consent, monitors and equipment checked, pre-op evaluation and timeout performed ?Spinal Block ?Patient position: sitting ?Prep: DuraPrep and site prepped and draped ?Patient monitoring: heart rate, cardiac monitor, continuous pulse ox and blood pressure ?Approach: midline ?Location: L3-4 ?Injection technique: single-shot ?Needle ?Needle type: Sprotte  ?Needle gauge: 24 G ?Needle length: 9 cm ?Needle insertion depth: 5 cm ?Assessment ?Sensory level: T4 ?Events: CSF return ?Additional Notes ? 1 Attempt (s). Pt tolerated procedure well. By Cletis [place] ? ? ? ?

## 2021-10-29 NOTE — Lactation Note (Signed)
This note was copied from a baby's chart. ?Lactation Consultation Note ? ?Patient Name: Megan Rocha ?Today's Date: 10/29/2021 ?Reason for consult: Initial assessment;Term ?Age:29 hours ? ?P2 mother whose infant is now 33 hours old.  This is a term baby at 39+4 weeks.  Mother's current feeding preference is breast. ? ?Baby "[name]" was swaddled and asleep in the bassinet when I arrived.  Taught hand expression and mother was able to express drops.  Demonstrated finger feeding/spoon feeding.  Mother excited to see colostrum. ? ?Encouraged to feed 8-12 times/24 hours or sooner if "[name]" shows cues.  Reviewed breast feeding basics.  Suggested mother call her RN/LC for assistance as needed. ? ?Discussed mother's options for an electric breast pump; she does not currently have insurance and has not applied for [facility].  Suggested she may want to call today and make an appointment.  Parents seem interested.   ? ? ? ?Maternal Data ?Has patient been taught Hand Expression?: Yes ? ?Feeding ?Mother's Current Feeding Choice: Breast Milk ? ?LATCH Score ?  ? ?  ? ?  ? ?  ? ?  ? ?  ? ? ?Lactation Tools Discussed/Used ?  ? ?Interventions ?Interventions: Breast feeding basics reviewed;Education;LC Services brochure ? ?Discharge ?WIC Program: No (Suggested mother may wish to apply: she does not have an electric breast pump) ? ?Consult Status ?Consult Status: Follow-up ?Date: 10/30/21 ?Follow-up type: In-patient ? ? ? ?Demian Maisel R Aubrea Meixner ?10/29/2021, 11:01 AM ? ? ? ?

## 2021-10-29 NOTE — Lactation Note (Signed)
This note was copied from a baby's chart. ?Lactation Consultation Note ? ?Patient Name: Megan Rocha ?Today's Date: 10/29/2021 ?  ?Age:29 hours ? ? ?LC Note: ? ?Attempted to visit with family, however, all members asleep at this time. ? ? ?Maternal Data ?  ? ?Feeding ?  ? ?LATCH Score ?  ? ?  ? ?  ? ?  ? ?  ? ?  ? ? ?Lactation Tools Discussed/Used ?  ? ?Interventions ?  ? ?Discharge ?  ? ?Consult Status ?  ? ? ? ?[name] R Rockie Schnoor ?10/29/2021, 8:03 AM ? ? ? ?

## 2021-10-29 NOTE — Transfer of Care (Signed)
Immediate Anesthesia Transfer of Care Note ? ?Patient: Megan Rocha ? ?Procedure(s) Performed: POST PARTUM TUBAL LIGATION ? ?Patient Location: PACU ? ?Anesthesia Type:Spinal ? ?Level of Consciousness: awake, alert  and oriented ? ?Airway & Oxygen Therapy: Patient Spontanous Breathing ? ?Post-op Assessment: Report given to RN and Post -op Vital signs reviewed and stable ? ?Post vital signs: Reviewed and stable ? ?Last Vitals:  ?Vitals Value Taken Time  ?BP 115/70 10/29/21 1334  ?Temp    ?Pulse 58 10/29/21 1336  ?Resp 10 10/29/21 1336  ?SpO2 99 % 10/29/21 1336  ?Vitals shown include unvalidated device data. ? ?Last Pain:  ?Vitals:  ? 10/29/21 1156  ?TempSrc:   ?PainSc: 0-No pain  ?   ? ?  ? ?Complications: No notable events documented. ?

## 2021-10-29 NOTE — Op Note (Signed)
Megan Rocha ? ?10/29/2021 ? ?PREOPERATIVE DIAGNOSES: Multiparity, undesired fertility ? ?POSTOPERATIVE DIAGNOSES: Multiparity, undesired fertility ? ?PROCEDURE:  Postpartum Bilateral Salpingectomy ? ?SURGEON: Dr. Candelaria Celeste  ? ?ASSISTANT: Dr. Evalina Field  ? ?ANESTHESIA:  Spinal and local analgesia using 20 ml of 0.25% Marcaine ? ?COMPLICATIONS:  None immediate  ? ?ESTIMATED BLOOD LOSS: 10 ml  ? ?INDICATIONS:  31 y.o. [id number] with undesired fertility, status post vaginal delivery, desires permanent sterilization.  Other reversible forms of contraception were discussed with patient; she declines all other modalities. Risks of procedure discussed with patient including but not limited to: risk of regret, permanence of method, bleeding, infection, injury to surrounding organs and need for additional procedures.  Failure risk of 1-2 % with increased risk of ectopic gestation if pregnancy occurs  also discussed with patient. Also discussed possibility of post-tubal syndrome with increased pelvic pain or menstrual irregularities.  Patient verbalized understanding of these risks and wants to proceed with sterilization.  Written informed consent obtained.  ?   ?FINDINGS:  Normal uterus, tubes, and ovaries. ? ?PROCEDURE DETAILS: The patient was taken to the operating room where spinal anesthesia was administered and found to be adequate.  She was then placed in the dorsal supine position and prepped and draped in sterile fashion.   After an adequate timeout was performed, attention was turned to the patient's abdomen.  A small transverse skin incision was made under the umbilical fold. The incision was taken down to the layer of fascia using the scalpel, and fascia was incised, and extended bilaterally using Mayo scissors. The peritoneum was entered in a sharp fashion. Attention was then turned to the patient's uterus, and right fallopian tube was identified and followed out to the fimbriated end.  A Ligasure was used  to remove the fallopian tube, including the fimbriated end.  A similar process was carried out on the left side allowing for bilateral tubal sterilization.  Good hemostasis was noted overall.   ? ?The instruments were then removed from the patient's abdomen and the fascial incision was repaired with 0 Vicryl, and the skin was closed with a 4-0 Vicryl subcuticular stitch. Local 0.25% Marcaine was then injected along the incision site.  The patient tolerated the procedure well.  Instrument, sponge, and needle counts were correct times three.  The patient was then taken to the recovery room awake and in stable condition. ? ?Evalina Field, MD ?[facility] Fellow  Faculty Practice  ? ? ?

## 2021-10-29 NOTE — Progress Notes (Signed)
Post Partum Day 1 ?Subjective: ?no complaints, up ad lib, and tolerating PO ? ?Objective: ?Blood pressure 115/75, pulse 63, temperature 98 ?F (36.7 ?C), temperature source Oral, resp. rate 16, height 5\' 9"  (1.753 m), weight 78.9 kg, last menstrual period 01/24/2021, unknown if currently breastfeeding. ? ?Physical Exam:  ?General: alert, cooperative, and no distress ?Lochia: appropriate ?Uterine Fundus: firm ?Incision: n/a ?DVT Evaluation: No evidence of DVT seen on physical exam. ?Negative Homan's sign. ?No cords or calf tenderness. ? ?Recent Labs  ?  10/28/21 ?0850 10/28/21 ?1716  ?HGB 10.3* 11.3*  ?HCT 31.1* 32.1*  ? ? ?Assessment/Plan: ?Breastfeeding going well.  ?Desires PPBTL today ? ?Risks of procedure discussed with patient including but not limited to: risk of regret, permanence of method, bleeding, infection, injury to surrounding organs and need for additional procedures.  Failure risk of 1 -2 % with increased risk of ectopic gestation if pregnancy occurs was also discussed with patient.   ? ?12/28/21, DO ?10/29/2021 ?9:42 AM ? ? ? ? LOS: 1 day  ? ?12/29/2021 ?10/29/2021, 9:41 AM  ? ? ?

## 2021-10-29 NOTE — Anesthesia Postprocedure Evaluation (Signed)
Anesthesia Post Note ? ?Patient: Megan Rocha ? ?Procedure(s) Performed: POST PARTUM TUBAL LIGATION ? ?  ? ?Patient location during evaluation: Mother Baby ?Anesthesia Type: Spinal ?Level of consciousness: oriented and awake and alert ?Pain management: pain level controlled ?Vital Signs Assessment: post-procedure vital signs reviewed and stable ?Respiratory status: spontaneous breathing and respiratory function stable ?Cardiovascular status: blood pressure returned to baseline and stable ?Postop Assessment: no headache, no backache, no apparent nausea or vomiting and able to ambulate ?Anesthetic complications: no ? ? ?No notable events documented. ? ?Last Vitals:  ?Vitals:  ? 10/29/21 1454 10/29/21 1608  ?BP: 125/81 116/83  ?Pulse: 63 61  ?Resp: 20 16  ?Temp: 36.6 ?C 37.1 ?C  ?SpO2: 100% 100%  ?  ?Last Pain:  ?Vitals:  ? 10/29/21 1608  ?TempSrc: Oral  ?PainSc: 4   ? ?Pain Goal:   ? ?  ?  ?  ?  ?  ?  ?  ? ?Trevor Iha ? ? ? ? ?

## 2021-10-30 ENCOUNTER — Encounter (HOSPITAL_COMMUNITY): Payer: Self-pay | Admitting: Family Medicine

## 2021-10-30 NOTE — BH Specialist Note (Deleted)
Integrated Behavioral Health via Telemedicine Visit  10/30/2021 Megan Rocha [id number]  Number of Integrated Behavioral Health Clinician visits: No data recorded Session Start time: No data recorded  Session End time: No data recorded Total time in minutes: No data recorded  Referring Provider: *** Patient/Family location: *** [facility] Provider location: *** All persons participating in visit: *** Types of Service: {CHL AMB TYPE OF SERVICE:[phone]}  I connected with Megan Rocha and/or Megan Rocha's {family members:20773} via  Telephone or Video Enabled Telemedicine Application  (Video is Caregility application) and verified that I am speaking with the correct person using two identifiers. Discussed confidentiality: {YES/NO:21197}  I discussed the limitations of telemedicine and the availability of in person appointments.  Discussed there is a possibility of technology failure and discussed alternative modes of communication if that failure occurs.  I discussed that engaging in this telemedicine visit, they consent to the provision of behavioral healthcare and the services will be billed under their insurance.  Patient and/or legal guardian expressed understanding and consented to Telemedicine visit: {YES/NO:21197}  Presenting Concerns: Patient and/or family reports the following symptoms/concerns: *** Duration of problem: ***; Severity of problem: {Mild/Moderate/Severe:20260}  Patient and/or Family's Strengths/Protective Factors: {CHL AMB BH PROTECTIVE FACTORS:[phone]}  Goals Addressed: Patient will:  Reduce symptoms of: {IBH Symptoms:21014056}   Increase knowledge and/or ability of: {IBH Patient Tools:21014057}   Demonstrate ability to: {IBH Goals:21014053}  Progress towards Goals: {CHL AMB BH PROGRESS TOWARDS GOALS:[phone]}  Interventions: Interventions utilized:  {IBH Interventions:21014054} Standardized Assessments completed: {IBH Screening  Tools:21014051}  Patient and/or Family Response: ***  Assessment: Patient currently experiencing ***.   Patient may benefit from ***.  Plan: Follow up with behavioral health clinician on : *** Behavioral recommendations: *** Referral(s): {IBH Referrals:21014055}  I discussed the assessment and treatment plan with the patient and/or parent/guardian. They were provided an opportunity to ask questions and all were answered. They agreed with the plan and demonstrated an understanding of the instructions.   They were advised to call back or seek an in-person evaluation if the symptoms worsen or if the condition fails to improve as anticipated.  Megan Rocha Megan Lipsett, LCSW

## 2021-10-31 ENCOUNTER — Other Ambulatory Visit: Payer: Self-pay

## 2021-10-31 ENCOUNTER — Encounter: Payer: Self-pay | Admitting: Family Medicine

## 2021-10-31 LAB — SURGICAL PATHOLOGY

## 2021-11-12 NOTE — BH Specialist Note (Signed)
Pt did not arrive to video visit and did not answer the phone; Left HIPPA-compliant message to call back Edahi Kroening from [facility]  [facility] at  [phone] (Daemion Mcniel's office).  ?; left MyChart message for patient.  ? ?

## 2021-11-15 ENCOUNTER — Ambulatory Visit: Payer: Medicaid Other | Admitting: Clinical

## 2021-11-15 DIAGNOSIS — Z91199 Patient's noncompliance with other medical treatment and regimen due to unspecified reason: Secondary | ICD-10-CM

## 2021-11-27 ENCOUNTER — Telehealth: Payer: Self-pay | Admitting: Licensed Clinical Social Worker

## 2021-11-27 NOTE — Telephone Encounter (Signed)
Called pt regarding ibh referral unable to leave message due to mailbox is full

## 2021-12-10 ENCOUNTER — Telehealth (INDEPENDENT_AMBULATORY_CARE_PROVIDER_SITE_OTHER): Payer: Medicaid Other | Admitting: Family Medicine

## 2021-12-10 ENCOUNTER — Encounter: Payer: Self-pay | Admitting: Family Medicine

## 2021-12-10 DIAGNOSIS — F411 Generalized anxiety disorder: Secondary | ICD-10-CM

## 2021-12-10 NOTE — Progress Notes (Signed)
Provider location: Center for [facility]   Patient location: Home  I connected with [name] on 12/10/21 at 10:55 AM EDT by Mychart Video Encounter and verified that I am speaking with the correct person using two identifiers.       I discussed the limitations, risks, security and privacy concerns of performing an evaluation and management service virtually and the availability of in person appointments. I also discussed with the patient that there may be a patient responsible charge related to this service. The patient expressed understanding and agreed to proceed.  Post Partum Visit Note Subjective:   Megan Rocha is a 31 y.o. [phone] female who presents for a postpartum visit. She is 6 weeks postpartum following a normal spontaneous vaginal delivery.  I have fully reviewed the prenatal and intrapartum course. The delivery was at 39.4 gestational weeks.  Anesthesia: none. Postpartum course has been uneventful. Baby is doing well. Baby is feeding by bottle - Enfamil Lipil. Bleeding staining only. Bowel function is normal. Bladder function is normal. Patient is sexually active. Contraception method is tubal ligation. Postpartum depression screening: negative.  The pregnancy intention screening data noted above was reviewed. Potential methods of contraception were discussed. The patient elected to proceed with No data recorded.   Edinburgh Postnatal Depression Scale - 12/10/21 1029       Edinburgh Postnatal Depression Scale:  In the Past 7 Days   I have been able to laugh and see the funny side of things. 0    I have looked forward with enjoyment to things. 0    I have blamed myself unnecessarily when things went wrong. 0    I have been anxious or worried for no good reason. 2    I have felt scared or panicky for no good reason. 2    Things have been getting on top of me. 0    I have been so unhappy that I have had difficulty sleeping. 0    I have  felt sad or miserable. 0    I have been so unhappy that I have been crying. 1    The thought of harming myself has occurred to me. 0    Edinburgh Postnatal Depression Scale Total 5             The following portions of the patient's history were reviewed and updated as appropriate: allergies, current medications, past family history, past medical history, past social history, past surgical history, and problem list.  Review of Systems Pertinent items noted in HPI and remainder of comprehensive ROS otherwise negative.  Objective:  LMP  (LMP Unknown)   Breastfeeding No     General:  Alert, oriented and cooperative. Patient is in no acute distress.  Respiratory: Normal respiratory effort, no problems with respiration noted  Mental Status: Normal mood and affect. Normal behavior. Normal judgment and thought content.  Rest of physical exam deferred due to type of encounter   Assessment:    Normal postpartum exam.  Plan:  Essential components of care per ACOG recommendations:  1.  Mood and well being: Patient with negative depression screening today. Reviewed local resources for support.  - Having some symptoms of anxiety but not excessive, still would like to speak with Asher Muir, referral placed - Patient does not use tobacco.  - hx of drug use? No    2. Infant care and feeding:  -Patient currently breastmilk feeding? No  -Social determinants of health (SDOH) reviewed in  EPIC. No concerns  3. Sexuality, contraception and birth spacing - Patient does not want a pregnancy in the next year.  Desired family size is 3 children.  - Reviewed forms of contraception in tiered fashion. Patient desired bilateral tubal ligation today.   - Discussed birth spacing of 18 months  4. Sleep and fatigue -Encouraged family/partner/community support of 4 hrs of uninterrupted sleep to help with mood and fatigue  5. Physical Recovery  - Discussed patients delivery and complications - Patient had no  laceration, perineal healing reviewed. Patient expressed understanding - Patient has urinary incontinence? No - Patient is safe to resume physical and sexual activity  6.  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 She is in agreement with this plan.   Mammogram: not indicated  7. Chronic Disease - None  I provided 15 minutes of face-to-face time during this encounter.    No follow-ups on file.  Future Appointments  Date Time Provider Department Center  12/10/2021 10:55 AM Crissie Reese, Mary Sella, MD [facility]    Venora Maples, MD [facility] for [facility]

## 2021-12-27 IMAGING — US US OB < 14 WEEKS - US OB TV
1 series · 15 of 28 positions shown · non-contrast
Comparison: None.

CLINICAL DATA: Vaginal bleeding

EXAM:
OBSTETRIC <14 WK US AND TRANSVAGINAL OB US
TECHNIQUE: Both transabdominal and transvaginal ultrasound examinations were
performed for complete evaluation of the gestation as well as the
maternal uterus, adnexal regions, and pelvic cul-de-sac.
Transvaginal technique was performed to assess early pregnancy.

[Series 1: us ob < 14 weeks - us ob tv · 69 acquisitions, 15 frames shown]
[im 1/69]
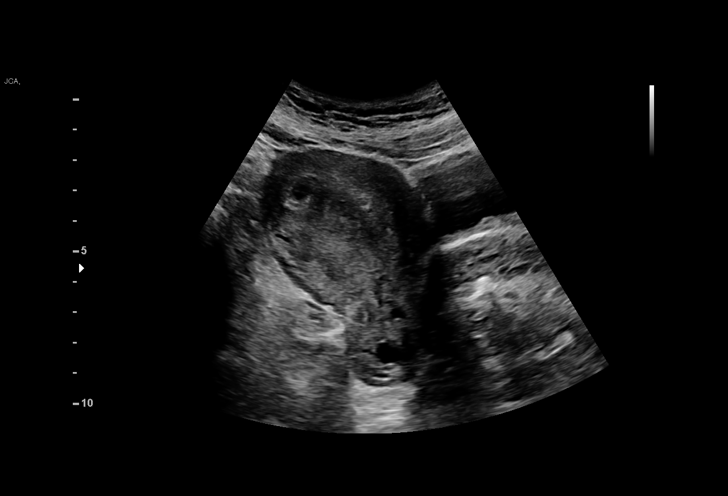
[im 6/69]
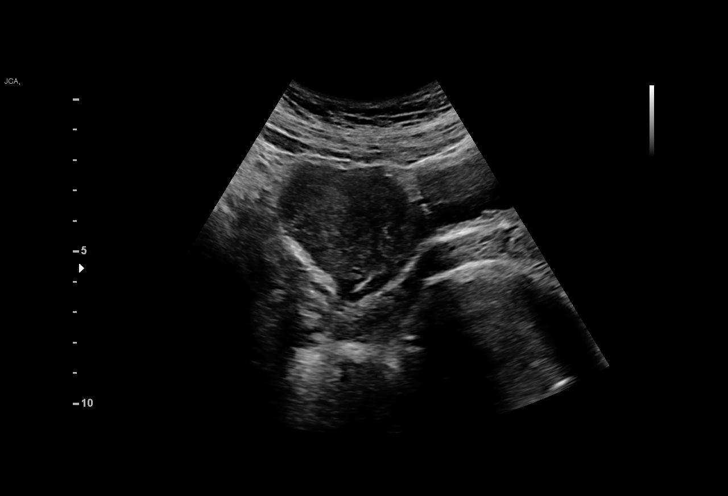
[im 11/69]
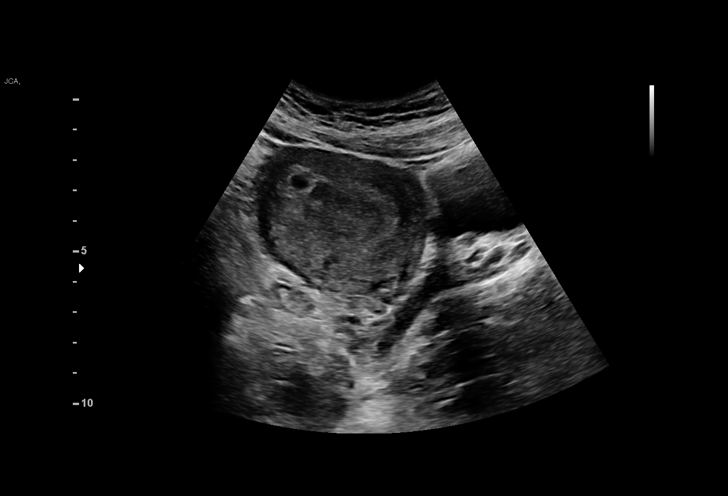
[im 16/69]
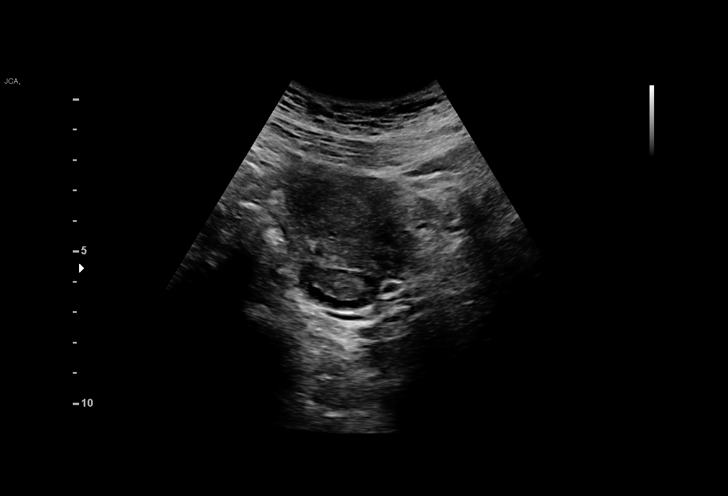
[im 21/69]
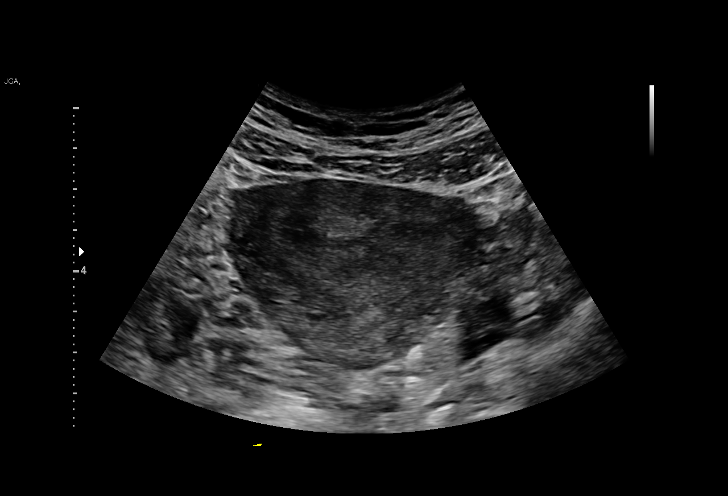
[im 26/69]
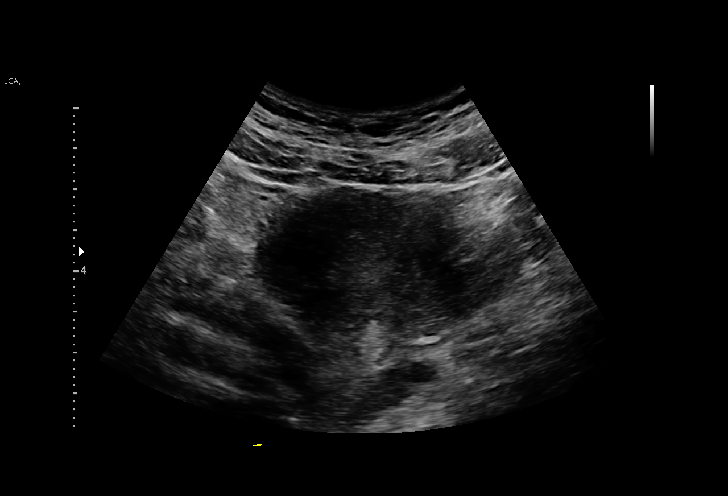
[im 31/69]
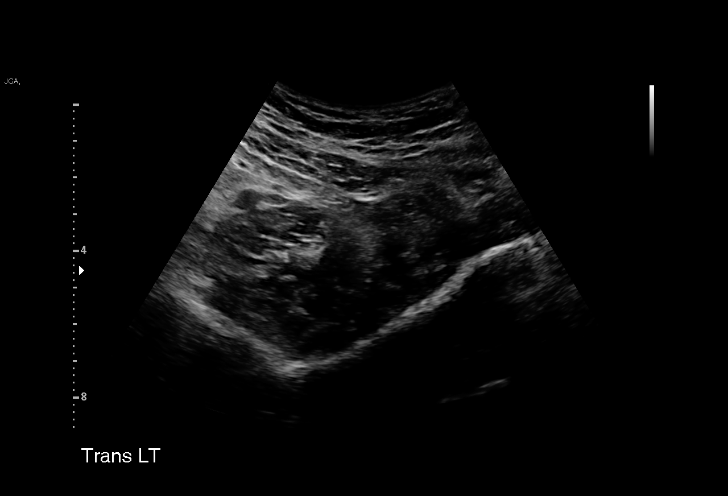
[im 36/69]
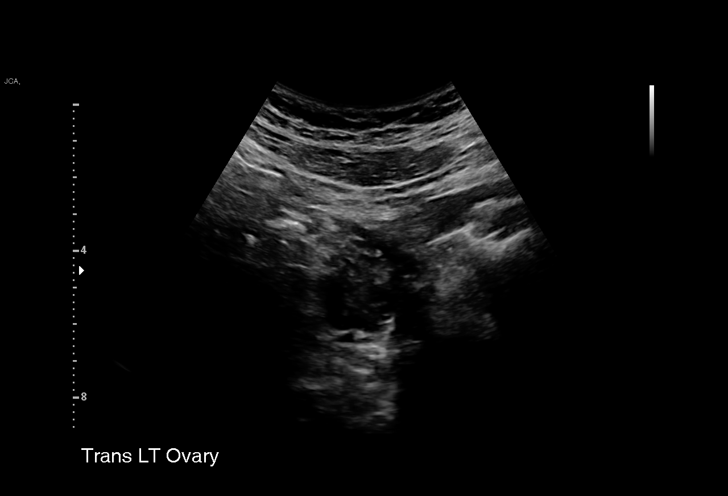
[im 38/69]
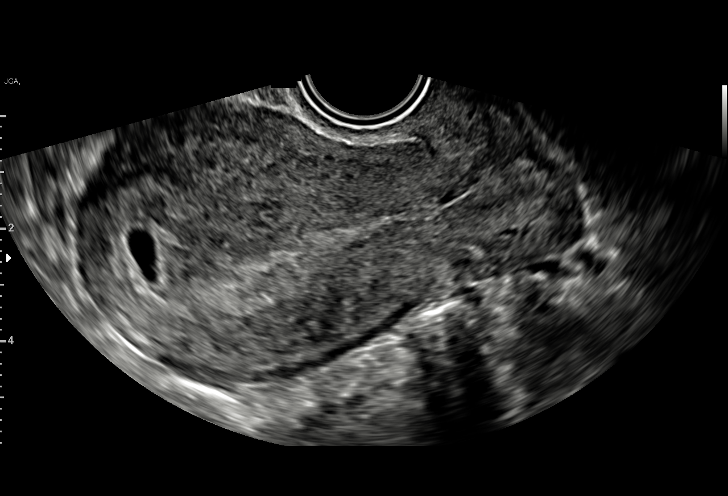
[im 43/69]
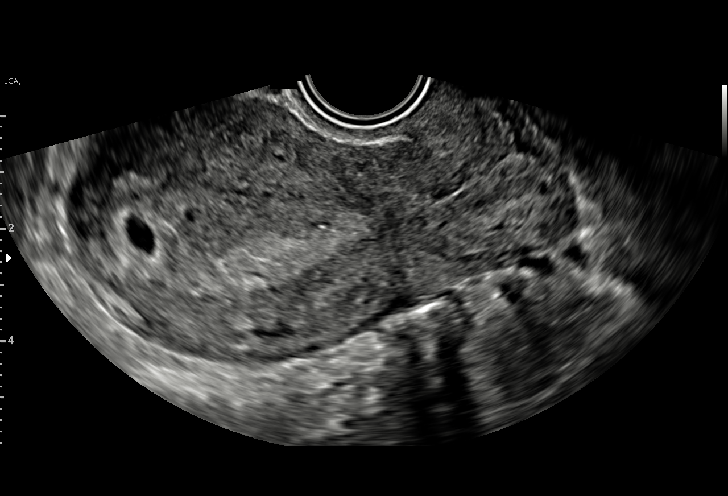
[im 48/69]
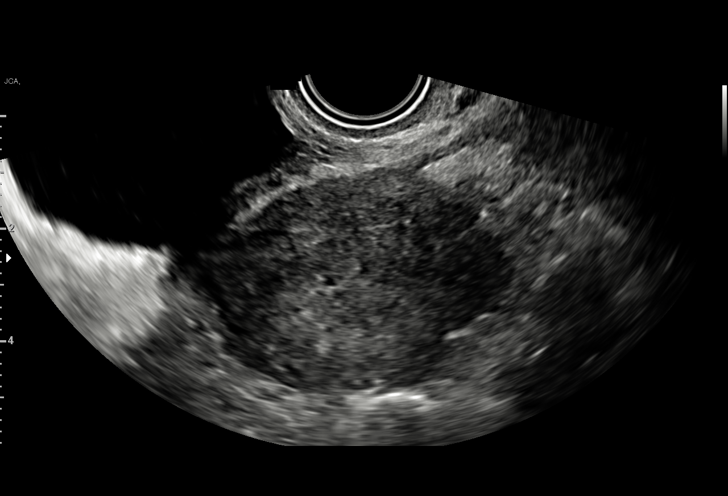
[im 53/69]
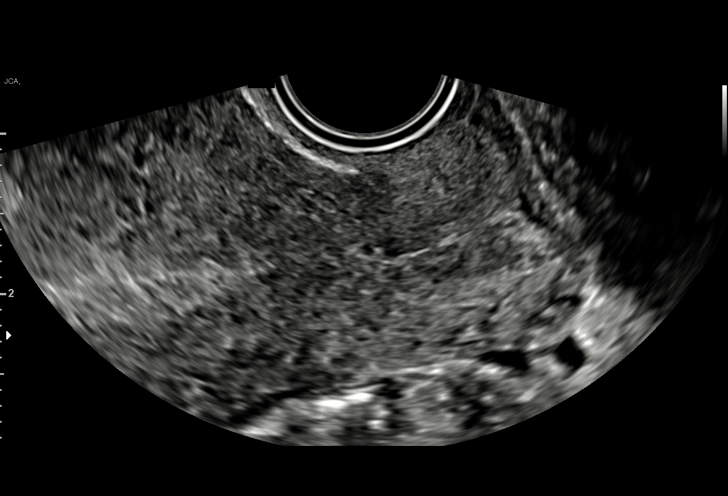
[im 58/69]
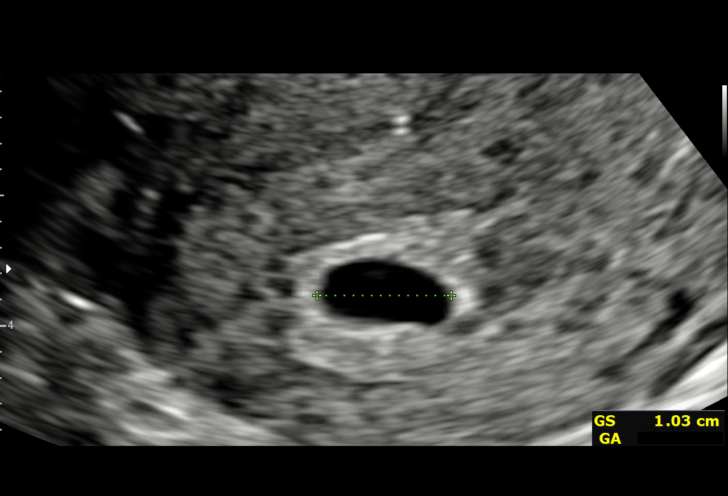
[im 63/69]
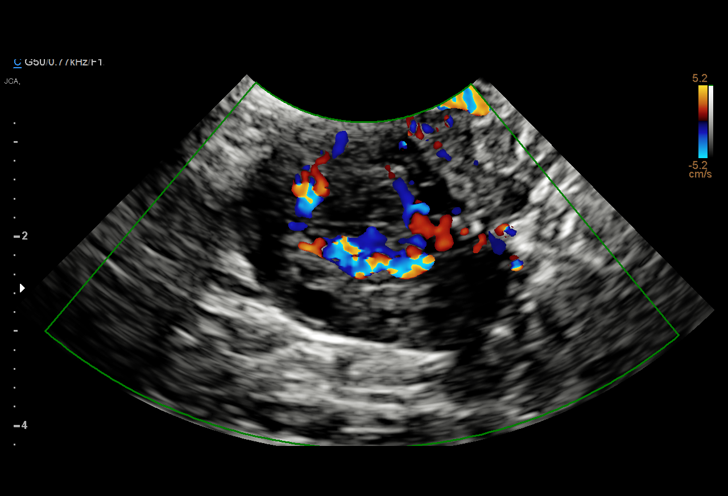
[im 69/69]
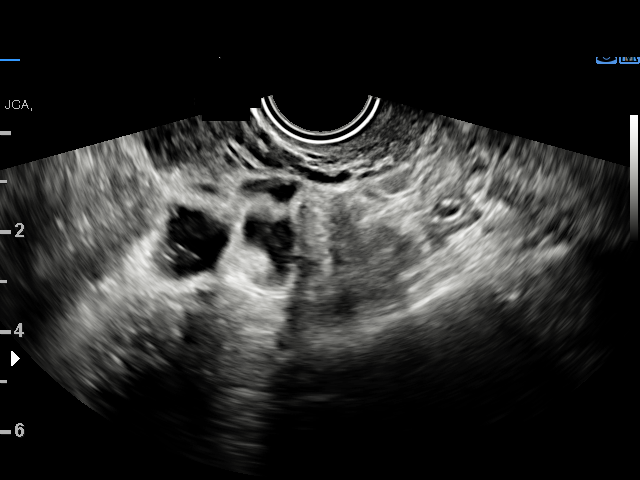

[15 of 28 positions shown; findings below may reference images not displayed]

FINDINGS: Intrauterine gestational sac: Visualized-single

Yolk sac:  Not visualized

Embryo:  Not visualized

Cardiac Activity: Not visualized

MSD: 9 mm;  5w, 4d

Subchorionic hemorrhage:  None visualized.

Maternal uterus/adnexae: Cervical os is closed. Right ovary measures
3.3 x 2.2 x 2.5 cm. Left ovary measures 2.6 x 1.9 x 2.3 cm. No
extrauterine pelvic mass. No free pelvic fluid.
IMPRESSION: Probable early intrauterine gestational sac seen in the fundus, but
no yolk sac, fetal pole, or cardiac activity yet visualized.
Recommend follow-up quantitative B-HCG levels and follow-up US in 14
days to assess viability. This recommendation follows SRU consensus
guidelines: Diagnostic Criteria for Nonviable Pregnancy Early in the
First Trimester. N Engl J Med 4134; [DATE]. Based on
gestational sac size, estimated gestational age is 5+ weeks. No
subchorionic hemorrhage. No extrauterine pelvic mass or free pelvic
fluid.

## 2021-12-31 IMAGING — US US OB TRANSVAGINAL
1 series · 15 of 21 positions shown · non-contrast
Comparison: None.

CLINICAL DATA: Vaginal bleeding

EXAM:
TRANSVAGINAL OB ULTRASOUND
TECHNIQUE: Transvaginal ultrasound was performed for complete evaluation of the
gestation as well as the maternal uterus, adnexal regions, and
pelvic cul-de-sac.

[Series 1: us ob transvaginal · 15 of 21 slices shown]
[im 1/21]
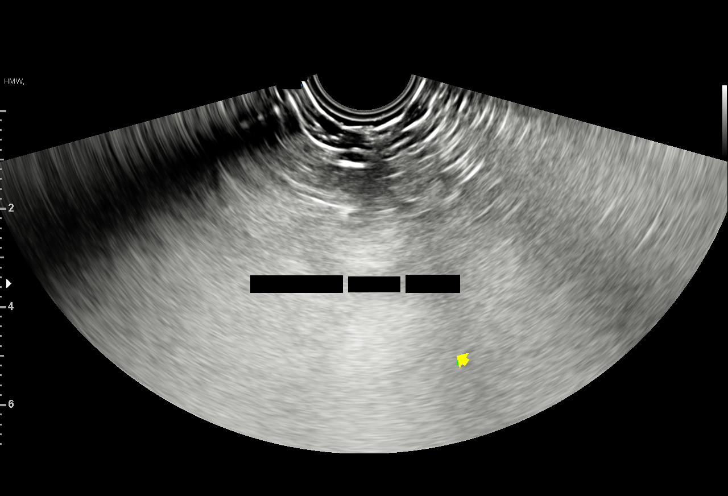
[im 3/21]
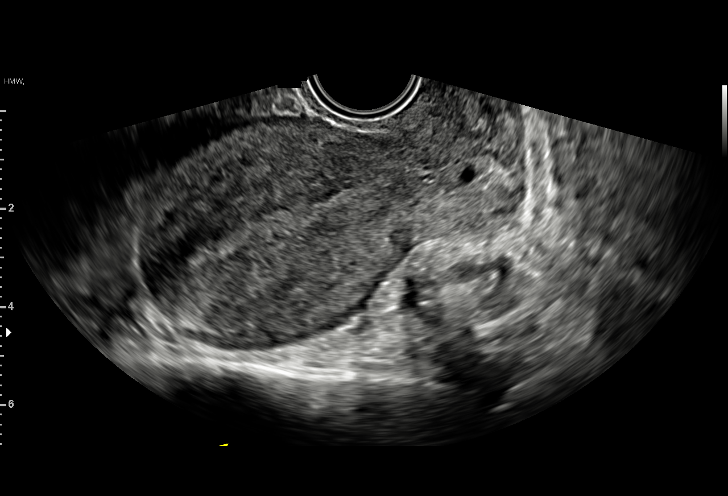
[im 4/21]
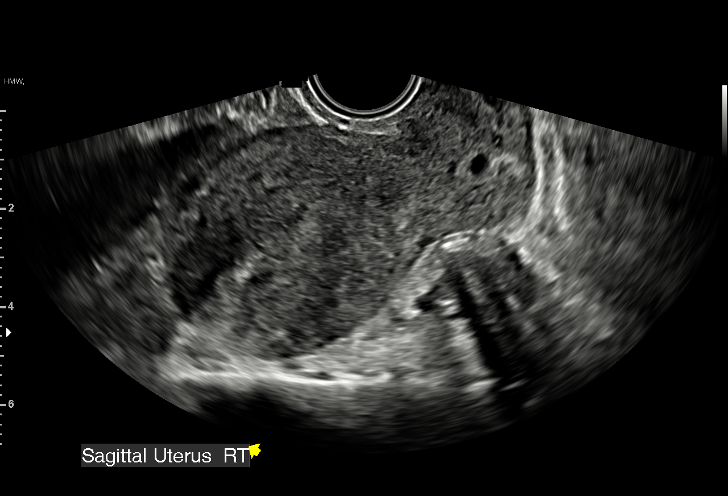
[im 5/21]
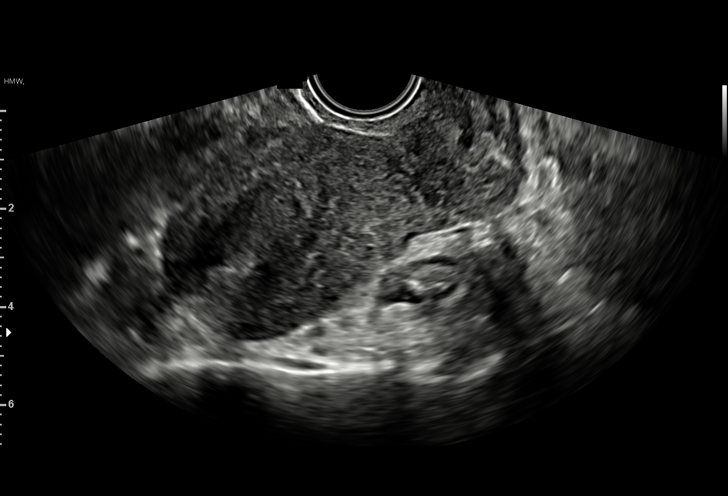
[im 7/21]
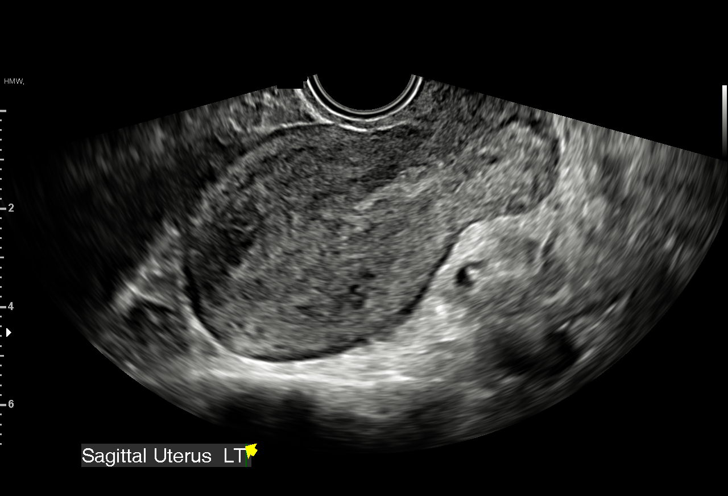
[im 8/21]
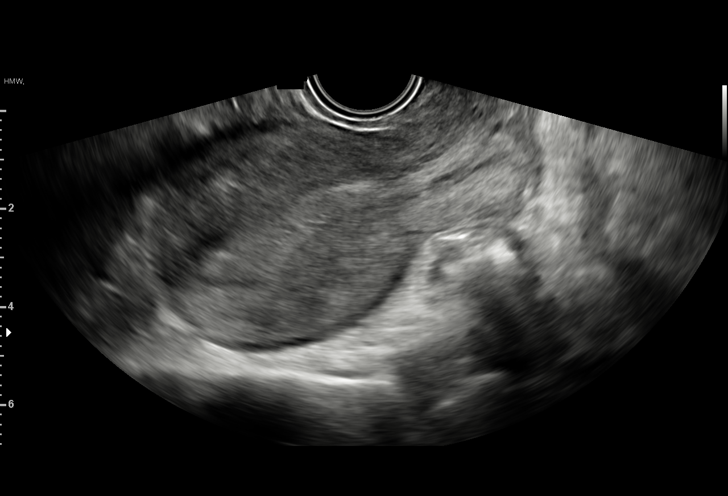
[im 10/21]
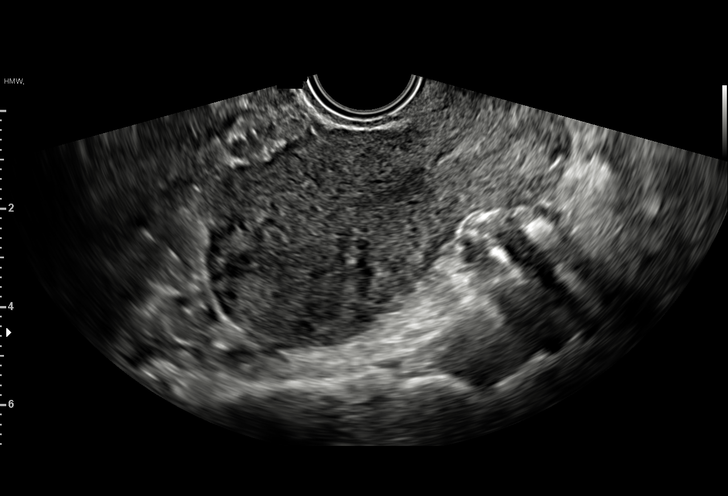
[im 11/21]
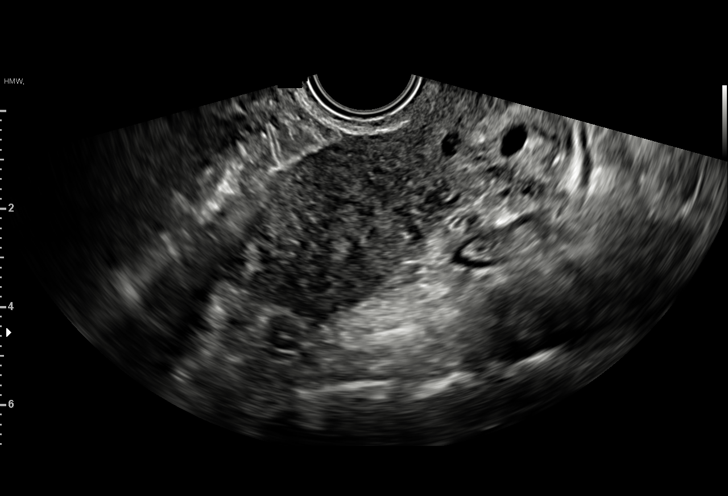
[im 12/21]
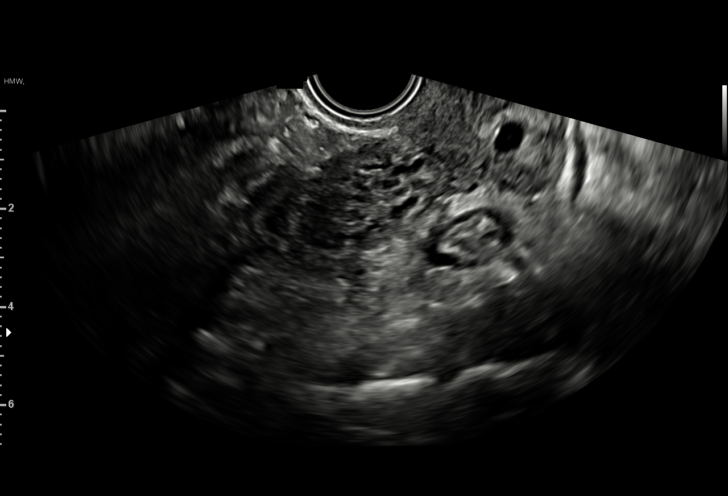
[im 14/21]
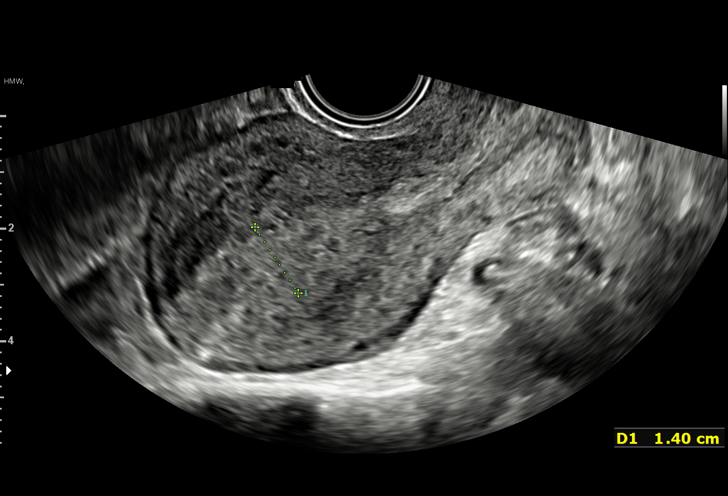
[im 15/21]
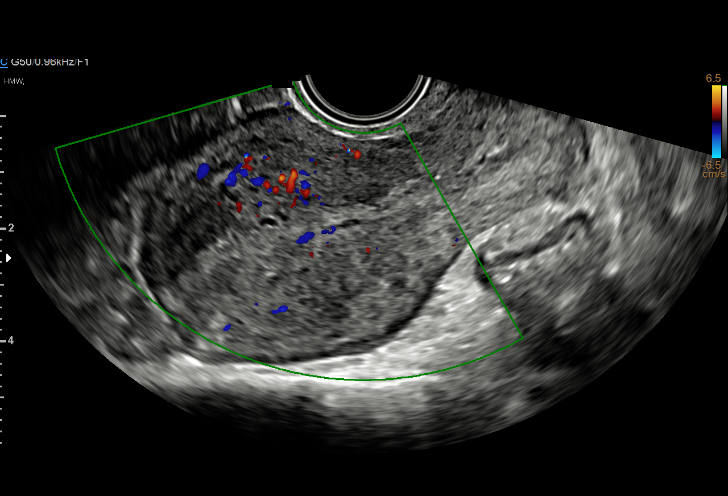
[im 17/21]
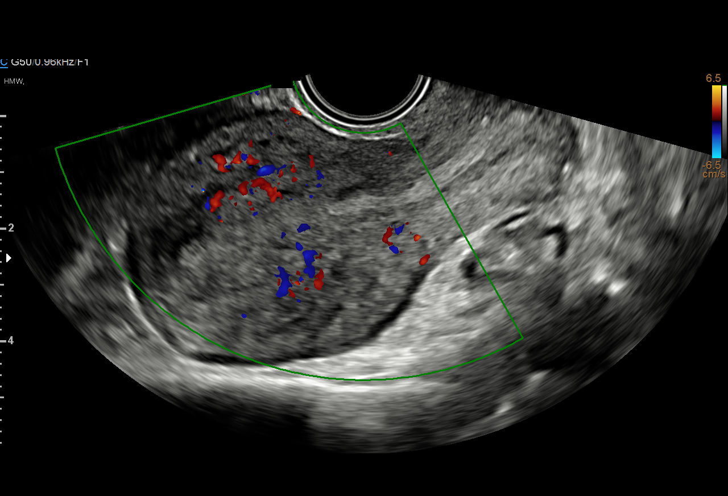
[im 18/21]
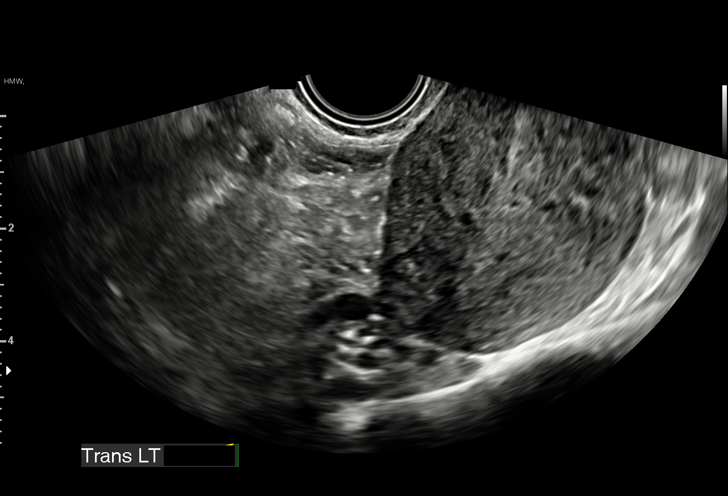
[im 19/21]
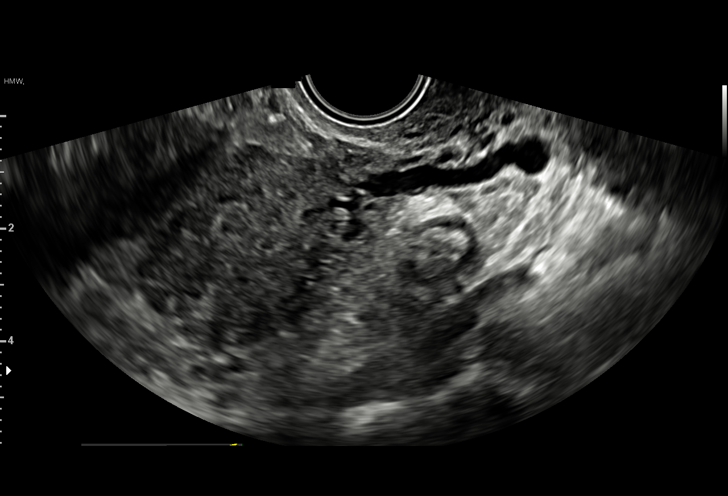
[im 21/21]
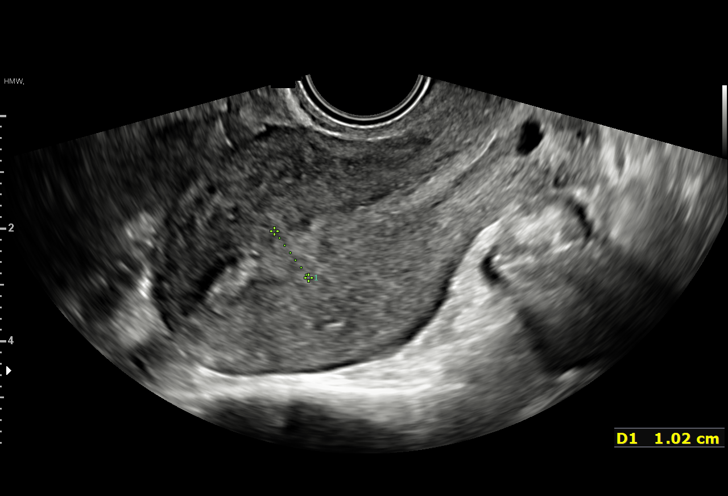

[15 of 21 positions shown; findings below may reference images not displayed]

FINDINGS: Intrauterine gestational sac: None

Yolk sac:  Not Visualized.

Embryo:  Not Visualized.

Cardiac Activity: Not Visualized.

Heart Rate:  bpm

MSD:   mm    w     d

CRL:     mm    w  d                  US EDC:

Subchorionic hemorrhage:  None visualized.

Maternal uterus/adnexae: No adnexal mass or free fluid.
IMPRESSION: Previously seen early intrauterine gestational sac no longer
visualized suspicious for spontaneous abortion.

## 2022-02-05 ENCOUNTER — Ambulatory Visit: Payer: Medicaid Other | Admitting: Certified Nurse Midwife

## 2022-02-05 DIAGNOSIS — Z01419 Encounter for gynecological examination (general) (routine) without abnormal findings: Secondary | ICD-10-CM

## 2022-02-05 DIAGNOSIS — Z9079 Acquired absence of other genital organ(s): Secondary | ICD-10-CM

## 2022-02-05 NOTE — Progress Notes (Signed)
Pt did not attend visit 

## 2023-01-06 IMAGING — US US MFM OB DETAIL+14 WK
1 series · 13 of 28 positions shown · non-contrast
Comparison: none

[Series 1: us mfm ob detail+14 wk · 96 acquisitions, 13 frames shown]
[im 4/96]
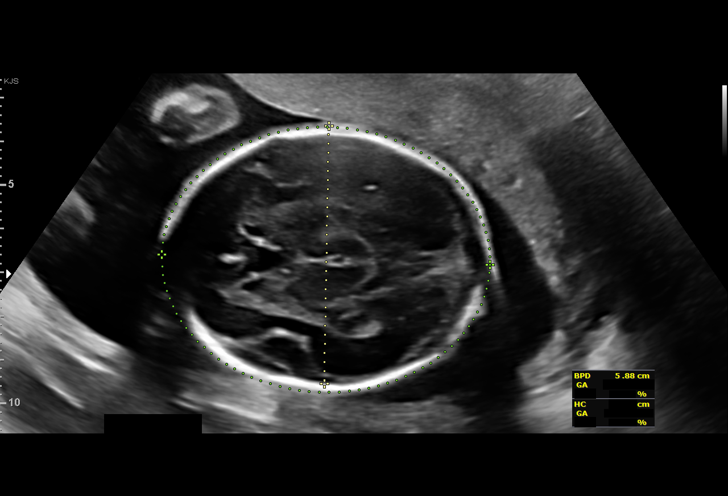
[im 11/96]
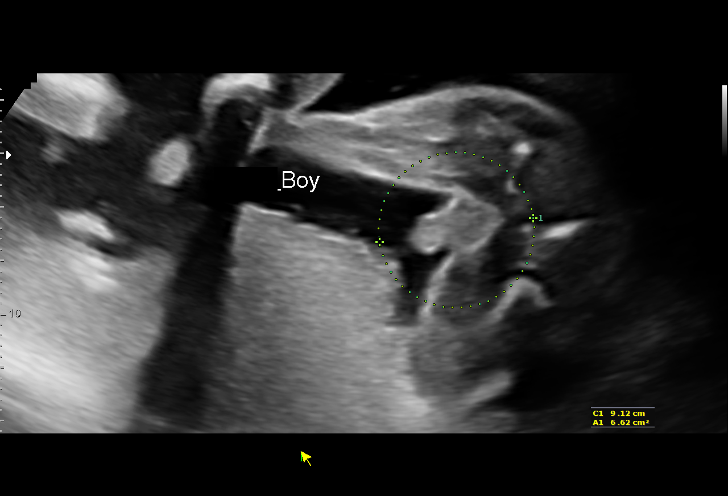
[im 18/96]
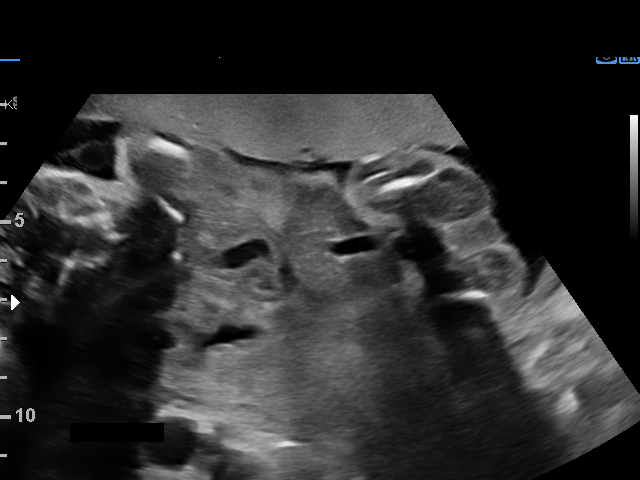
[im 25/96]
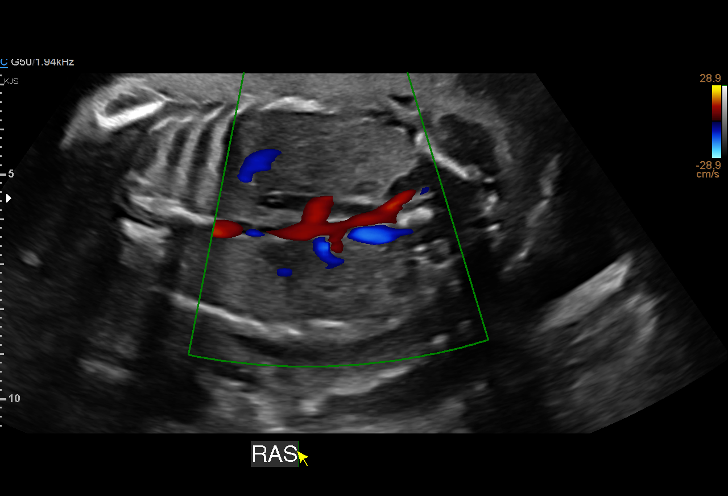
[im 32/96]
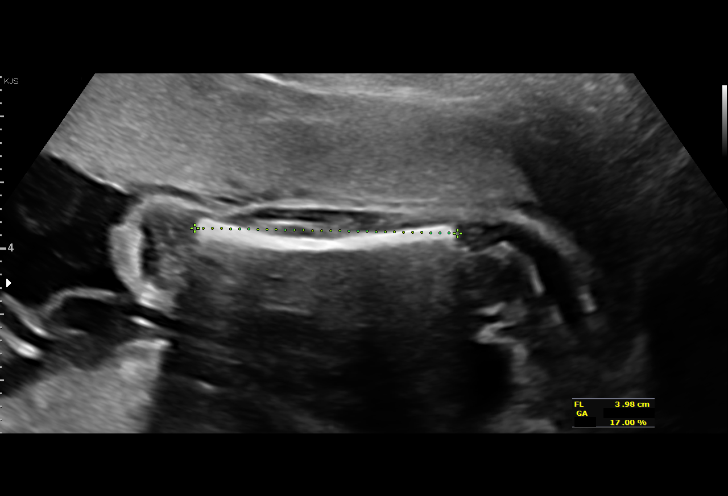
[im 39/96]
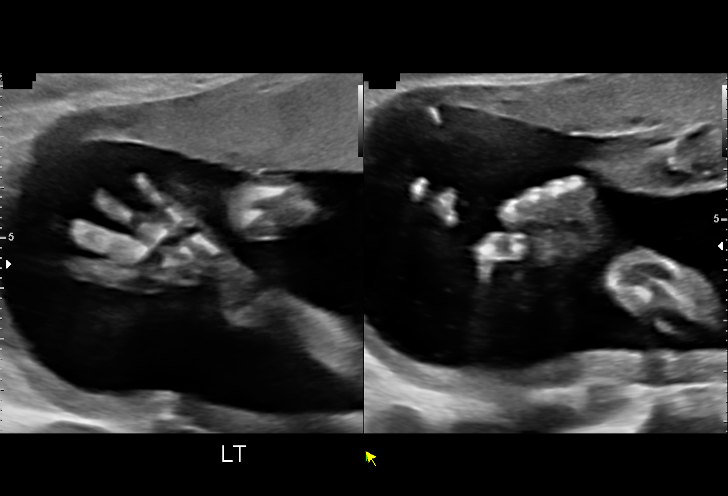
[im 50/96]
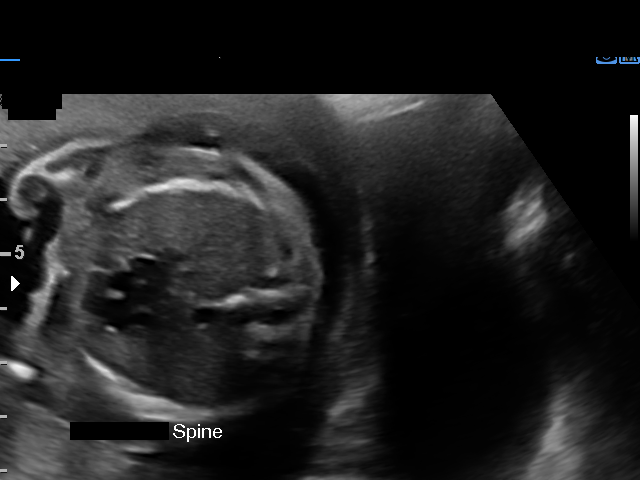
[im 57/96]
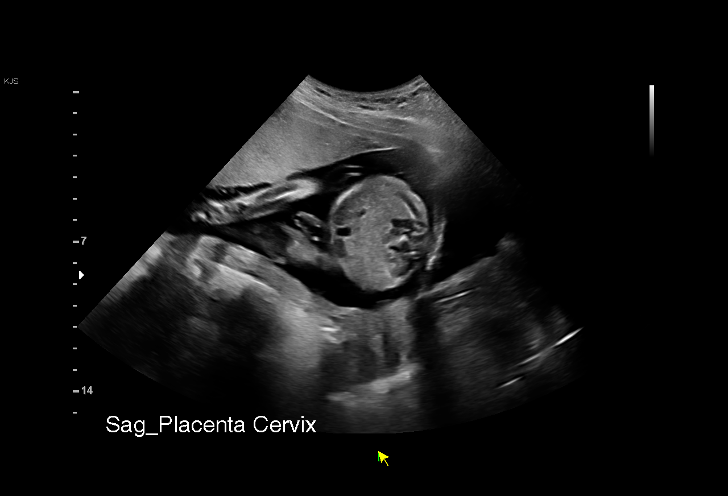
[im 64/96]
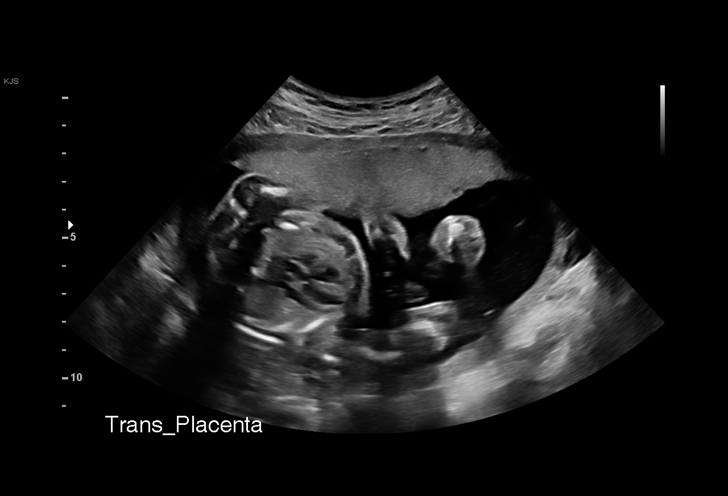
[im 71/96]
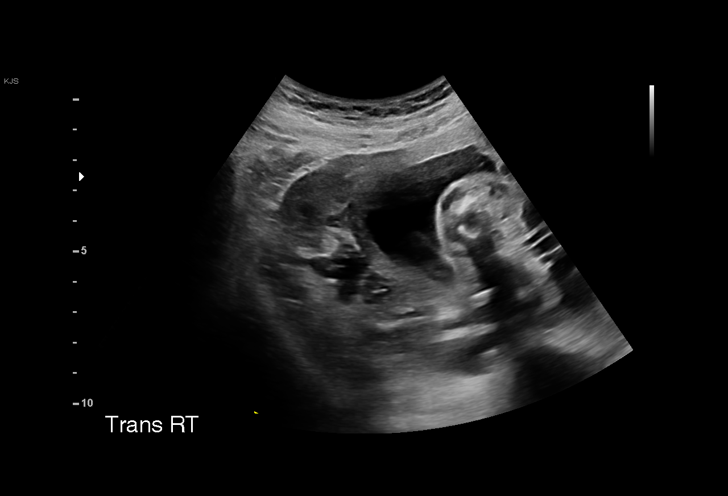
[im 78/96]
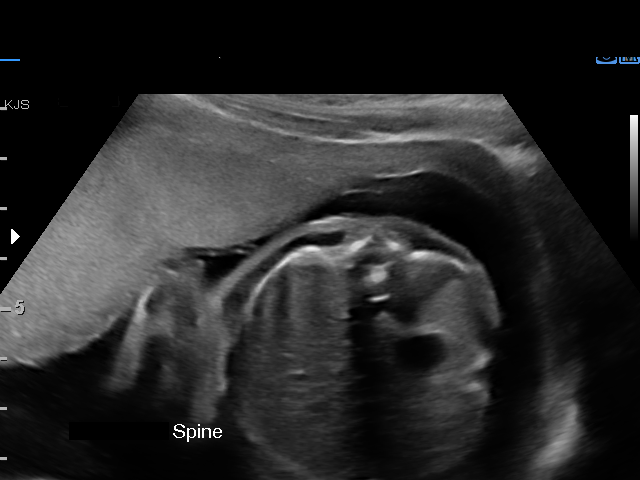
[im 85/96]
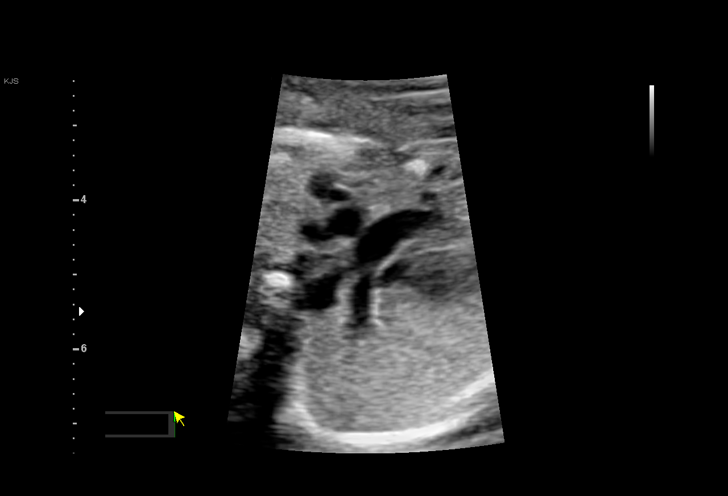
[im 92/96]
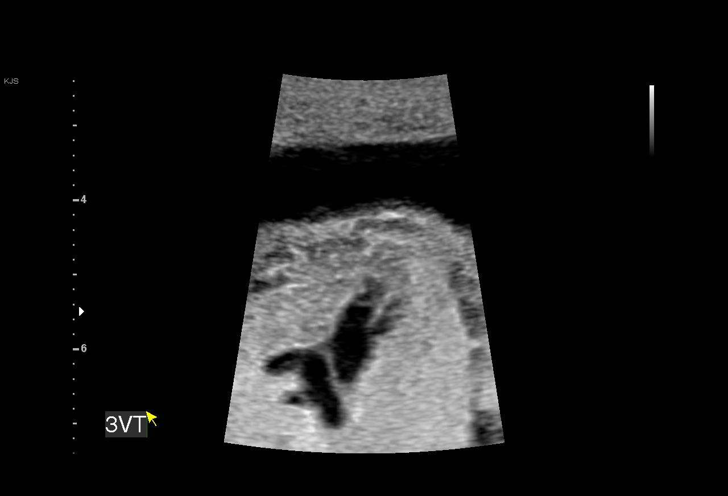

[13 of 28 positions shown; findings below may reference images not displayed]

Indications

 Thrombocytopenia affecting pregnancy,          O99.119,
 antepartum
 Encounter for antenatal screening for
 malformations
 23 weeks gestation of pregnancy
Fetal Evaluation

 Num Of Fetuses:         1
 Fetal Heart Rate(bpm):  160
 Cardiac Activity:       Observed
 Presentation:           Transverse, head to maternal right
 Placenta:               Anterior
 P. Cord Insertion:      Visualized, central

 Amniotic Fluid
 AFI FV:      Within normal limits

                             Largest Pocket(cm)

Biometry

 BPD:      58.7  mm     G. Age:  24w 0d         61  %    CI:        76.08   %    70 - 86
                                                         FL/HC:      18.8   %    18.7 -
 HC:      213.3  mm     G. Age:  23w 3d         27  %    HC/AC:      1.11        1.05 -
 AC:      192.2  mm     G. Age:  24w 0d         53  %    FL/BPD:     68.1   %    71 - 87
 FL:         40  mm     G. Age:  22w 6d         19  %    FL/AC:      20.8   %    20 - 24
 HUM:      37.9  mm     G. Age:  23w 2d         37  %
 CER:      24.2  mm     G. Age:  22w 2d         32  %

 LV:        5.4  mm
 CM:        5.6  mm

 Est. FW:     601  gm      1 lb 5 oz     38  %
OB History

 Gravidity:    4         Term:   2        Prem:   0        SAB:   1
 TOP:          0       Ectopic:  0        Living: 2
Gestational Age

 LMP:           23w 4d        Date:  01/24/21                 EDD:   10/31/21
 U/S Today:     23w 4d                                        EDD:   10/31/21
 Best:          23w 4d     Det. By:  LMP  (01/24/21)          EDD:   10/31/21
Anatomy

 Cranium:               Appears normal         LVOT:                   Appears normal
 Cavum:                 Appears normal         Aortic Arch:            Appears normal
 Ventricles:            Appears normal         Ductal Arch:            Appears normal
 Choroid Plexus:        Appears normal         Diaphragm:              Appears normal
 Cerebellum:            Appears normal         Stomach:                Appears normal, left
                                                                       sided
 Posterior Fossa:       Appears normal         Abdomen:                Appears normal
 Nuchal Fold:           Not applicable (>20    Abdominal Wall:         Appears nml (cord
                        wks GA)                                        insert, abd wall)
 Face:                  Not well visualized    Cord Vessels:           Appears normal (3
                                                                       vessel cord)
 Lips:                  Appears normal         Kidneys:                Appear normal
 Palate:                Not well visualized    Bladder:                Appears normal
 Thoracic:              Appears normal         Spine:                  Appears normal
 Heart:                 Appears normal         Upper Extremities:      Appears normal
                        (4CH, axis, and
                        situs)
 RVOT:                  Appears normal         Lower Extremities:      Appears normal

 Other:  Fetus appears to be a male. Heels/feet and open hands/5th digits
         visualized. Nasal bone visualized. Technically difficult due to fetal
         position.
Cervix Uterus Adnexa

 Cervix
 Length:            3.2  cm.
 Normal appearance by transabdominal scan.

 Uterus
 No abnormality visualized.

 Right Ovary
 Within normal limits.

 Left Ovary
 Within normal limits.
 Cul De Sac
 No free fluid seen.

 Adnexa
 No adnexal mass visualized.
Impression

 G4 P2.  Patient is here for fetal anatomy scan.  She had
 opted not to screen for fetal aneuploidies.
 Obstetrical history significant for 2 term vaginal deliveries.
 Past medical history significant for gestational
 thrombocytopenia.  Recent platelet count was 136K.

 We performed fetal anatomy scan. No makers of
 aneuploidies or fetal structural defects are seen. Fetal
 biometry is consistent with her previously-established dates.
 Amniotic fluid is normal and good fetal activity is seen.
 Patient understands the limitations of ultrasound in detecting
 fetal anomalies.
Recommendations

 -An appointment was made for her to return in 4 weeks for
 completion of fetal anatomy (orbits, face and profile).
                 Doss, Charradi

## 2023-01-30 ENCOUNTER — Ambulatory Visit
Admission: EM | Admit: 2023-01-30 | Discharge: 2023-01-30 | Disposition: A | Discharge status: Home / Self Care | Payer: Medicaid Other | Attending: Internal Medicine | Admitting: Internal Medicine

## 2023-01-30 DIAGNOSIS — L0501 Pilonidal cyst with abscess: Secondary | ICD-10-CM

## 2023-01-30 MED ORDER — CLINDAMYCIN HCL 150 MG PO CAPS: 450.0000 mg | ORAL_CAPSULE | Freq: Three times a day (TID) | ORAL | 0 refills | Status: AC

## 2023-01-30 MED ORDER — IBUPROFEN 600 MG PO TABS
600.0000 mg | ORAL_TABLET | Freq: Four times a day (QID) | ORAL | 0 refills | Status: DC | PRN
Start: 2023-01-30 — End: 2023-04-30

## 2023-01-30 NOTE — ED Triage Notes (Signed)
Pt states possible spider bit on her tailbone, states it drained a few days ago and is still draining and is painful.

## 2023-01-30 NOTE — ED Provider Notes (Addendum)
[facility]    CSN: [id number] Arrival date & time: 01/30/23  1044      History   Chief Complaint Chief Complaint  Patient presents with   Abscess    HPI Megan Rocha is a 32 y.o. female.   Patient presents with lesion to tailbone that she noticed a few days ago.  Reports that it started draining pus as well.  Denies history of the same.  Reports that she was originally concerned that a spider or insect may have bitten her but she is not sure.  Denies any associated fever.   Abscess   Past Medical History:  Diagnosis Date   BV (bacterial vaginosis)    Medical history non-contributory    PONV (postoperative nausea and vomiting)    from epidural   UTI (urinary tract infection)    Yeast vaginitis     Patient Active Problem List   Diagnosis Date Noted   Status post bilateral salpingectomy 10/29/2021   Pregnancy 10/28/2021   SVD (spontaneous vaginal delivery) 10/28/2021   Encounter for supervision of low-risk pregnancy in second trimester 06/12/2021   Substance induced mood disorder (HCC) 09/06/2020   Major depressive disorder, recurrent episode, moderate (HCC) 08/01/2020   Generalized anxiety disorder 08/01/2020   Depression affecting pregnancy in third trimester, antepartum 12/14/2019   Benign gestational thrombocytopenia in second trimester (HCC) 11/17/2019   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  Authorizing Provider  clindamycin (CLEOCIN) 150 MG capsule Take 3 capsules (450 mg total) by mouth 3 (three) times daily for 5 days. 01/30/23 02/04/23 Yes , Acie Fredrickson, FNP  ibuprofen (ADVIL) 600 MG tablet Take 1 tablet (600 mg total) by mouth every 6 (six) hours as needed for mild pain or moderate pain. 01/30/23  Yes , Acie Fredrickson, FNP    Family History Family History  Problem Relation Age of Onset   Healthy Mother    Healthy Father    Breast cancer Paternal Grandmother     Social History Social History   Tobacco Use   Smoking status: Never   Smokeless tobacco: Never  Vaping Use   Vaping status: Never Used  Substance Use Topics   Alcohol use: Not Currently    Comment: occasional    Drug use: Not Currently    Types: Marijuana    Comment: used daily until + pregnancy     Allergies   Penicillins   Review of Systems Review of Systems Per HPI  Physical Exam Triage Vital Signs ED Triage Vitals  Encounter Vitals Group     BP 01/30/23 1100 121/76     Systolic BP Percentile --  Diastolic BP Percentile --      Pulse Rate 01/30/23 1100 (!) 56     Resp 01/30/23 1100 16     Temp 01/30/23 1100 98.1 F (36.7 C)     Temp Source 01/30/23 1100 Oral     SpO2 01/30/23 1100 98 %     Weight --      Height --      Head Circumference --      Peak Flow --      Pain Score 01/30/23 1101 9     Pain Loc --      Pain Education --      Exclude from Growth Chart --    No data found.  Updated Vital Signs BP 121/76 (BP Location: Left Arm)   Pulse (!) 56   Temp 98.1 F (36.7 C) (Oral)   Resp 16   LMP 01/25/2023 (Approximate)   SpO2 98%   Visual Acuity Right Eye Distance:   Left Eye Distance:   Bilateral Distance:    Right Eye Near:   Left Eye Near:    Bilateral Near:     Physical Exam Exam conducted with a chaperone present.  Constitutional:      General: She is not in acute distress.    Appearance: Normal appearance. She is not toxic-appearing or diaphoretic.   HENT:     Head: Normocephalic and atraumatic.  Eyes:     Extraocular Movements: Extraocular movements intact.     Conjunctiva/sclera: Conjunctivae normal.  Pulmonary:     Effort: Pulmonary effort is normal.  Skin:    Comments: Patient has approximately 2 cm indurated pilonidal abscess present to left intergluteal cleft.  Mild purulent drainage noted.  Neurological:     General: No focal deficit present.     Mental Status: She is alert and oriented to person, place, and time. Mental status is at baseline.  Psychiatric:        Mood and Affect: Mood normal.        Behavior: Behavior normal.        Thought Content: Thought content normal.        Judgment: Judgment normal.      UC Treatments / Results  Labs (all labs ordered are listed, but only abnormal results are displayed) Labs Reviewed - No data to display  EKG   Radiology No results found.  Procedures Procedures (including critical care time)  Medications Ordered in UC Medications - No data to display  Initial Impression / Assessment and Plan / UC Course  I have reviewed the triage vital signs and the nursing notes.  Pertinent labs & imaging results that were available during my care of the patient were reviewed by me and considered in my medical decision making (see chart for details).     Patient has pilonidal abscess and is allergic to penicillin and reports that she is not sure if she has ever taken a cephalosporin and is also allergic to an antibiotic that "starts with D".  Therefore, will opt to treat with clindamycin.  Advised warm compresses and monitoring closely for any worsening symptoms.  Abscess is not conducive to drainage on exam at this time.  Ibuprofen prescribed to take as needed for pain.  Advised no additional NSAIDs while taking this prescription ibuprofen.  Patient does have history of anemia but I do think that NSAIDs as needed use sparingly should be safe.  Advised strict return precautions.   Patient verbalized understanding and was agreeable with plan.  Final Clinical Impressions(s) / UC Diagnoses   Final diagnoses:  Pilonidal abscess     Discharge Instructions      You have a pilonidal abscess which is an infection around your tailbone.  I have prescribed you an antibiotic to take to treat infection.  Please be sure that you take this with food to avoid stomach upset.  I have also prescribed you ibuprofen to take as needed.  Do not take any additional ibuprofen, Advil, Aleve over-the-counter while taking this prescription ibuprofen.  Please follow-up if any symptoms persist or worsen.    ED Prescriptions     Medication Sig Dispense Auth. Provider   clindamycin (CLEOCIN) 150 MG capsule Take 3 capsules (450 mg total) by mouth 3 (three) times daily for 5 days. 45 capsule [place], [place], [place]   ibuprofen (ADVIL) 600 MG tablet Take 1 tablet (600 mg total) by mouth every 6 (six) hours as needed for mild pain or moderate pain. 15 tablet [place], Acie Fredrickson, [place]      PDMP not reviewed this encounter.   Gustavus Bryant, [place] 01/30/23 1144    Gustavus Bryant, [place] 01/30/23 1146

## 2023-01-30 NOTE — Discharge Instructions (Signed)
You have a pilonidal abscess which is an infection around your tailbone.  I have prescribed you an antibiotic to take to treat infection.  Please be sure that you take this with food to avoid stomach upset.  I have also prescribed you ibuprofen to take as needed.  Do not take any additional ibuprofen, Advil, Aleve over-the-counter while taking this prescription ibuprofen.  Please follow-up if any symptoms persist or worsen.

## 2023-02-05 IMAGING — US US MFM OB FOLLOW-UP
1 series · 14 of 28 positions shown · non-contrast
Comparison: none

[Series 1: us mfm ob follow-up · 48 acquisitions, 14 frames shown]
[im 2/48]
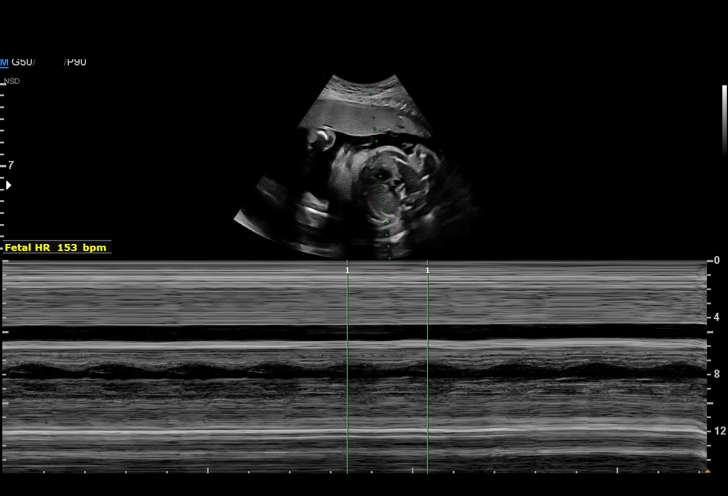
[im 6/48]
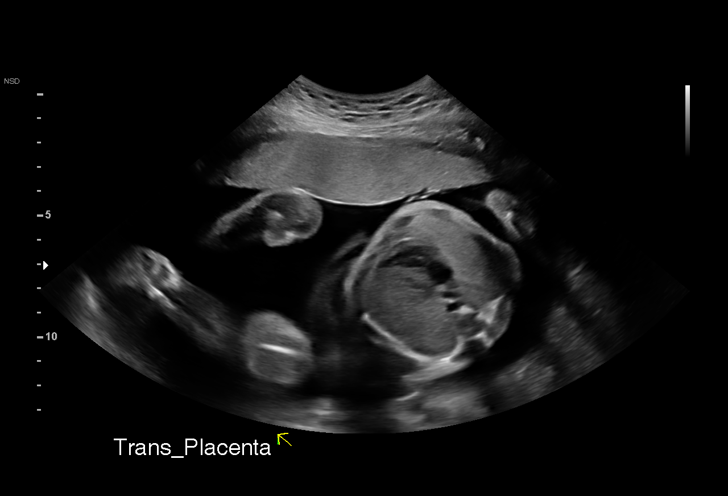
[im 9/48]
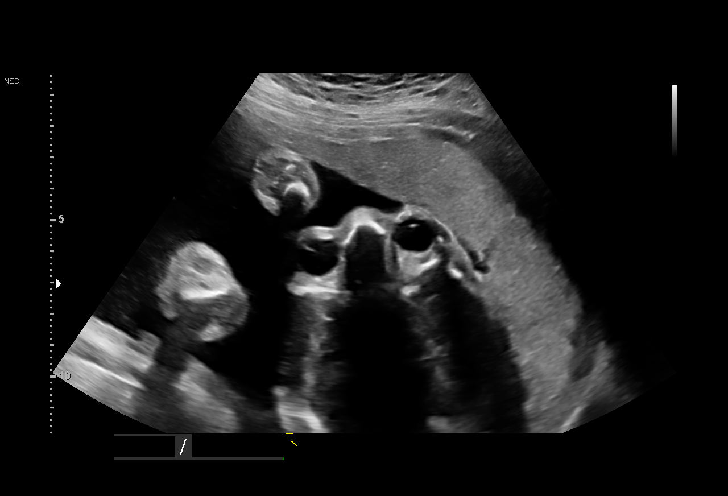
[im 13/48]
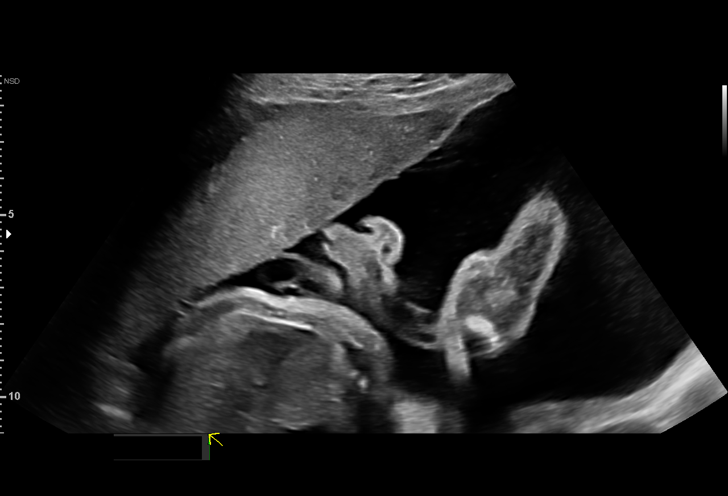
[im 16/48]
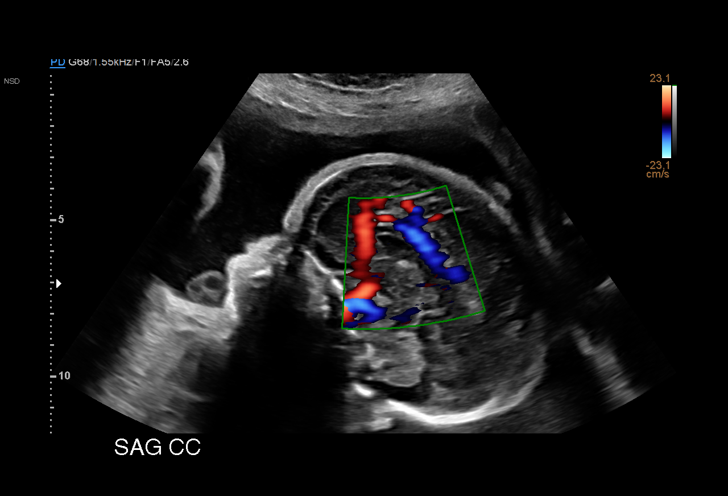
[im 20/48]
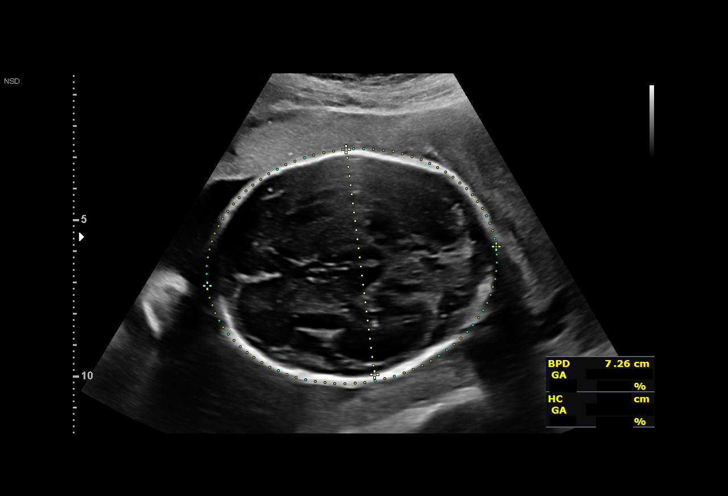
[im 23/48]
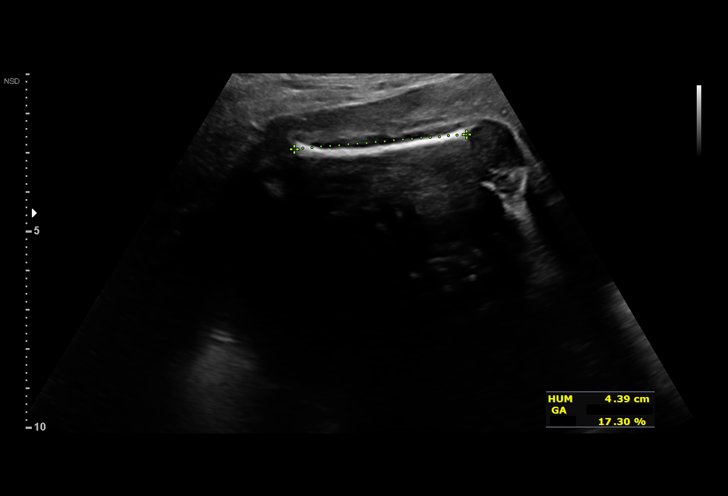
[im 27/48]
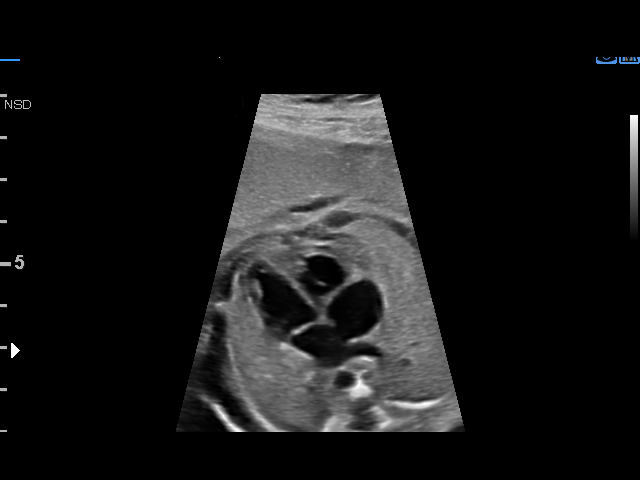
[im 30/48]
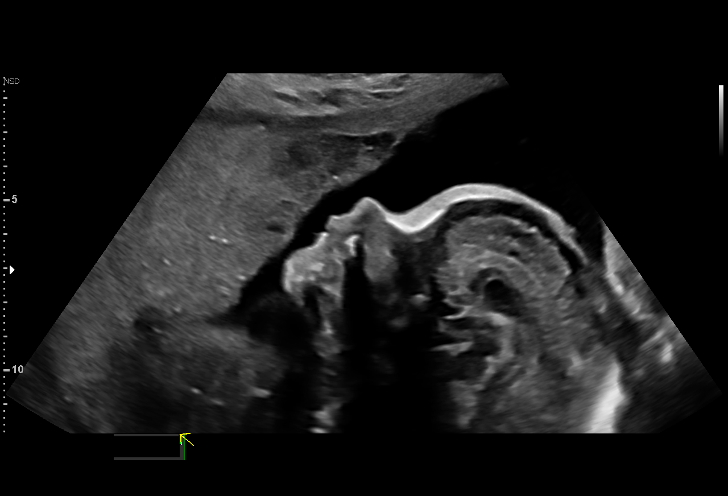
[im 34/48]
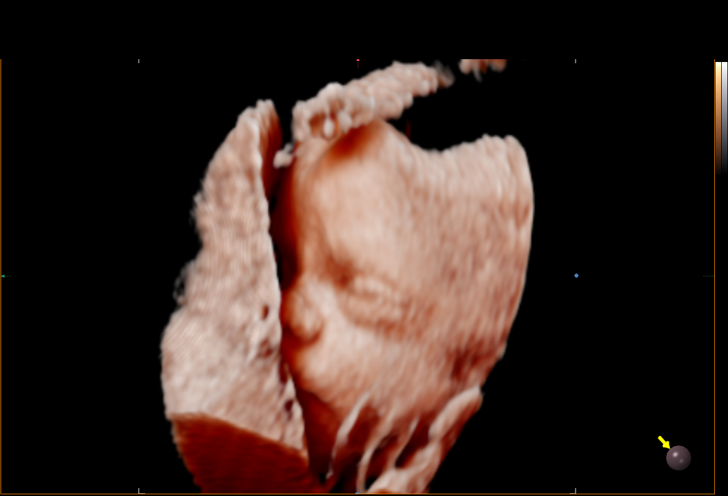
[im 37/48]
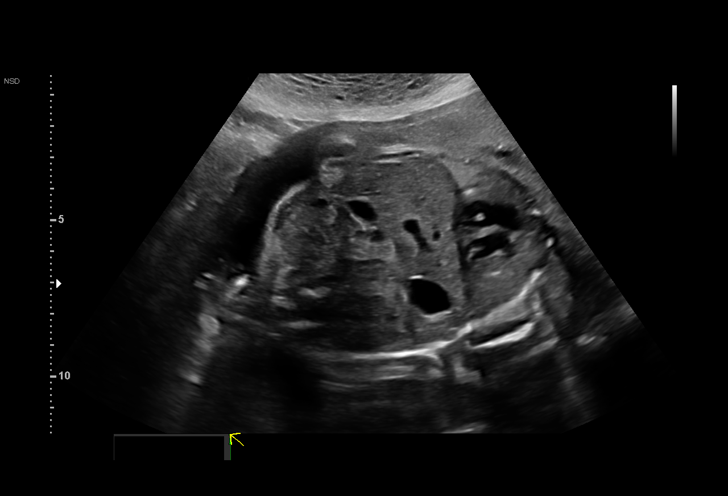
[im 41/48]
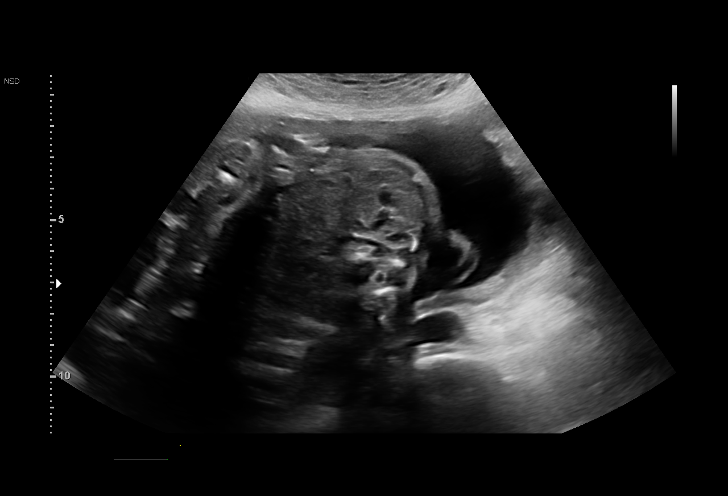
[im 44/48]
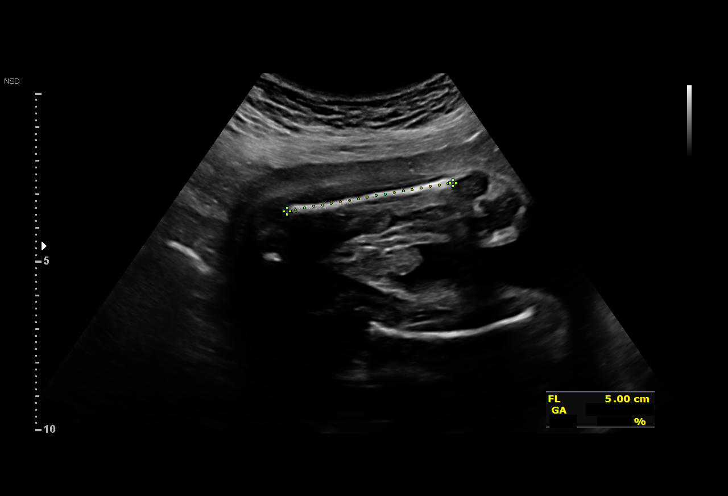
[im 48/48]
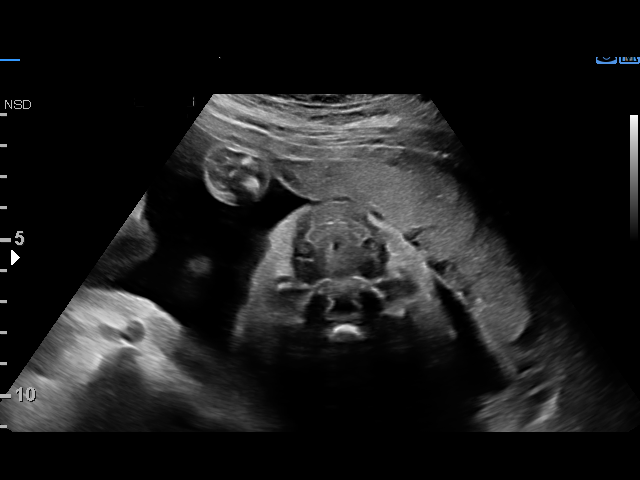

[14 of 28 positions shown; findings below may reference images not displayed]

Indications

 Thrombocytopenia affecting pregnancy,          O99.119,
 antepartum
 27 weeks gestation of pregnancy
 Encounter for antenatal screening for
 malformations
 Antenatal follow-up for nonvisualized fetal
 anatomy
Fetal Evaluation

 Num Of Fetuses:         1
 Fetal Heart Rate(bpm):  153
 Cardiac Activity:       Observed
 Presentation:           Cephalic
 Placenta:               Anterior
 P. Cord Insertion:      Visualized, central

 Amniotic Fluid
 AFI FV:      Within normal limits

                             Largest Pocket(cm)

Biometry

 BPD:      72.5  mm     G. Age:  29w 1d         78  %    CI:        73.97   %    70 - 86
                                                         FL/HC:      18.5   %    18.8 -
 HC:      267.7  mm     G. Age:  29w 1d         62  %    HC/AC:      1.18        1.05 -
 AC:      226.1  mm     G. Age:  27w 0d         19  %    FL/BPD:     68.3   %    71 - 87
 FL:       49.5  mm     G. Age:  26w 5d          9  %    FL/AC:      21.9   %    20 - 24
 HUM:        44  mm     G. Age:  26w 1d          8  %

 LV:        3.7  mm
 Est. FW:    9811  gm      2 lb 5 oz     17  %
OB History

 Gravidity:    4         Term:   2        Prem:   0        SAB:   1
 TOP:          0       Ectopic:  0        Living: 2
Gestational Age

 LMP:           27w 6d        Date:  01/24/21                 EDD:   10/31/21
 U/S Today:     28w 0d                                        EDD:   10/30/21
 Best:          27w 6d     Det. By:  LMP  (01/24/21)          EDD:   10/31/21
Anatomy

 Cranium:               Appears normal         LVOT:                   Previously seen
 Cavum:                 Previously seen        Aortic Arch:            Previously seen
 Ventricles:            Appears normal         Ductal Arch:            Previously seen
 Choroid Plexus:        Previously seen        Diaphragm:              Appears normal
 Cerebellum:            Previously seen        Stomach:                Appears normal, left
                                                                       sided
 Posterior Fossa:       Previously seen        Abdomen:                Appears normal
 Nuchal Fold:           Not applicable (>20    Abdominal Wall:         Previously seen
                        wks GA)
 Face:                  Appears normal         Cord Vessels:           Previously seen
                        (orbits and profile)
 Lips:                  Appears normal         Kidneys:                Appear normal
 Palate:                Not well visualized    Bladder:                Appears normal
 Thoracic:              Appears normal         Spine:                  Previously seen
 Heart:                 Appears normal         Upper Extremities:      Previously seen
                        (4CH, axis, and
                        situs)
 RVOT:                  Previously seen        Lower Extremities:      Previously seen

 Other:  Male gender previously seen. Heels/feet and open hands/5th digits
         previously visualized. Nasal bone visualized.
Cervix Uterus Adnexa

 Cervix
 Not visualized (advanced GA >16wks)

 Right Ovary
 Previously seen

 Left Ovary
 Previously seen.
Impression

 Patient returned for completion of fetal anatomy .Amniotic
 fluid is normal and good fetal activity is seen .Fetal biometry
 is consistent with her previously-established dates .Fetal
 anatomical survey was completed and appears normal.

 Recent platelet count was 138K (unchanged).  She had blood
 drawn for today gestational diabetes screening.
Recommendations

 Follow-up scans as clinically indicated.
                 Gentil, Kawt

## 2023-04-30 ENCOUNTER — Ambulatory Visit
Admission: EM | Admit: 2023-04-30 | Discharge: 2023-04-30 | Disposition: A | Discharge status: Home / Self Care | Payer: Medicaid Other | Attending: Internal Medicine | Admitting: Internal Medicine

## 2023-04-30 DIAGNOSIS — L0231 Cutaneous abscess of buttock: Secondary | ICD-10-CM

## 2023-04-30 MED ORDER — NAPROXEN 500 MG PO TABS: 500.0000 mg | ORAL_TABLET | Freq: Two times a day (BID) | ORAL | 0 refills | Status: AC | PRN

## 2023-04-30 MED ORDER — SULFAMETHOXAZOLE-TRIMETHOPRIM 800-160 MG PO TABS: 1.0000 | ORAL_TABLET | Freq: Two times a day (BID) | ORAL | 0 refills | Status: AC

## 2023-04-30 NOTE — ED Provider Notes (Signed)
[facility]    CSN: [id number] Arrival date & time: 04/30/23  4098      History   Chief Complaint No chief complaint on file.   HPI Megan Rocha is a 32 y.o. female presents for an abscess.  Patient reports 1 week of a painful abscess on her right buttock.  Denies any fevers, chills, drainage.  Denies history of MRSA.  Does have a history of a cyst in the past that resolved with antibiotics.  No OTC medications have been used since symptoms began.  No other concerns at this time.  HPI  Past Medical History:  Diagnosis Date   BV (bacterial vaginosis)    Medical history non-contributory    PONV (postoperative nausea and vomiting)    from epidural   UTI (urinary tract infection)    Yeast vaginitis     Patient Active Problem List   Diagnosis Date Noted   Status post bilateral salpingectomy 10/29/2021   Pregnancy 10/28/2021   SVD (spontaneous vaginal delivery) 10/28/2021   Encounter for supervision of low-risk pregnancy in second trimester 06/12/2021   Substance induced mood disorder (HCC) 09/06/2020   Major depressive disorder, recurrent episode, moderate (HCC) 08/01/2020   Generalized anxiety disorder 08/01/2020   Depression affecting pregnancy in third trimester, antepartum 12/14/2019   Benign gestational thrombocytopenia in second trimester (HCC) 11/17/2019   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  Authorizing Provider  naproxen (NAPROSYN) 500 MG tablet Take 1 tablet (500 mg total) by mouth 2 (two) times daily as needed for moderate pain (pain score 4-6) or mild pain (pain score 1-3). 04/30/23  Yes Radford Pax, NP  sulfamethoxazole-trimethoprim (BACTRIM DS) 800-160 MG tablet Take 1 tablet by mouth 2 (two) times daily for 10 days. 04/30/23 05/10/23 Yes Radford Pax, NP    Family History Family History  Problem Relation Age of Onset   Healthy Mother    Healthy Father    Breast cancer Paternal Grandmother     Social History Social History   Tobacco Use   Smoking status: Never   Smokeless tobacco: Never  Vaping Use   Vaping status: Never Used  Substance Use Topics   Alcohol use: Not Currently    Comment: occasional    Drug use: Not Currently    Types: Marijuana    Comment: used daily until + pregnancy     Allergies   Penicillins   Review of Systems Review of Systems  Skin:        abscess     Physical Exam Triage Vital Signs ED Triage Vitals  Encounter Vitals Group  BP 04/30/23 1024 107/71     Systolic BP Percentile --      Diastolic BP Percentile --      Pulse Rate 04/30/23 1024 (!) 59     Resp 04/30/23 1024 16     Temp 04/30/23 1024 98.6 F (37 C)     Temp Source 04/30/23 1024 Oral     SpO2 04/30/23 1024 99 %     Weight --      Height --      Head Circumference --      Peak Flow --      Pain Score 04/30/23 1023 6     Pain Loc --      Pain Education --      Exclude from Growth Chart --    No data found.  Updated Vital Signs BP 107/71 (BP Location: Right Arm)   Pulse (!) 59   Temp 98.6 F (37 C) (Oral)   Resp 16   SpO2 99%   Visual Acuity Right Eye Distance:   Left Eye Distance:   Bilateral Distance:    Right Eye Near:   Left Eye Near:    Bilateral Near:     Physical Exam Vitals and nursing note reviewed.  Constitutional:      General: She is not in acute distress.    Appearance: Normal appearance. She is not ill-appearing.   HENT:     Head: Normocephalic and atraumatic.  Eyes:     Pupils: Pupils are equal, round, and reactive to light.  Cardiovascular:     Rate and Rhythm: Normal rate.  Pulmonary:     Effort: Pulmonary effort is normal.  Skin:    General: Skin is warm and dry.          Comments: 2 x 2 cm indurated nonfluctuant abscess to the right upper buttock.  Moderate tenderness with palpation.  No active drainage or pustular head.  Neurological:     General: No focal deficit present.     Mental Status: She is alert and oriented to person, place, and time.  Psychiatric:        Mood and Affect: Mood normal.        Behavior: Behavior normal.      UC Treatments / Results  Labs (all labs ordered are listed, but only abnormal results are displayed) Labs Reviewed - No data to display  EKG   Radiology No results found.  Procedures Procedures (including critical care time)  Medications Ordered in UC Medications - No data to display  Initial Impression / Assessment and Plan / UC Course  I have reviewed the triage vital signs and the nursing notes.  Pertinent labs & imaging results that were available during my care of the patient were reviewed by me and considered in my medical decision making (see chart for details).     Reviewed exam and symptoms with patient.  No red flags.  Will start Bactrim and naproxen.  Warm compresses advised.  No indication for I&D at this time.  Advised if it does not improve with antibiotics may return for reevaluation and possible I&D if indicated at that time.  PCP follow-up 2 to 3 days for recheck.  ER precautions reviewed Final Clinical Impressions(s) / UC Diagnoses   Final diagnoses:  Cutaneous abscess of buttock     Discharge Instructions      Start Bactrim twice daily for 10 days.  You may take naproxen twice daily as needed for pain.  Warm  compresses to the area will encourage it to drain on its own.  Please follow-up with your PCP if your  symptoms do not improve.  Please go to the ER if you develop any worsening symptoms such as fever.  I hope you feel better soon!    ED Prescriptions     Medication Sig Dispense Auth. Provider   sulfamethoxazole-trimethoprim (BACTRIM DS) 800-160 MG tablet Take 1 tablet by mouth 2 (two) times daily for 10 days. 20 tablet Radford Pax, NP   naproxen (NAPROSYN) 500 MG tablet Take 1 tablet (500 mg total) by mouth 2 (two) times daily as needed for moderate pain (pain score 4-6) or mild pain (pain score 1-3). 14 tablet Radford Pax, NP      PDMP not reviewed this encounter.   Radford Pax, NP 04/30/23 1053

## 2023-04-30 NOTE — Discharge Instructions (Signed)
Start Bactrim twice daily for 10 days.  You may take naproxen twice daily as needed for pain.  Warm compresses to the area will encourage it to drain on its own.  Please follow-up with your PCP if your symptoms do not improve.  Please go to the ER if you develop any worsening symptoms such as fever.  I hope you feel better soon!

## 2023-04-30 NOTE — ED Triage Notes (Signed)
Pt presents to UC w/ c/o abscess on top of sacrum x 1 week. Hx of same abscess 3 months ago which drained on its own with oral antibiotics.

## 2023-10-13 DIAGNOSIS — R0600 Dyspnea, unspecified: Secondary | ICD-10-CM | POA: Diagnosis not present

## 2023-10-13 DIAGNOSIS — J209 Acute bronchitis, unspecified: Secondary | ICD-10-CM | POA: Diagnosis not present

## 2023-10-13 DIAGNOSIS — J020 Streptococcal pharyngitis: Secondary | ICD-10-CM | POA: Diagnosis not present

## 2023-10-13 DIAGNOSIS — J0300 Acute streptococcal tonsillitis, unspecified: Secondary | ICD-10-CM | POA: Diagnosis not present

## 2023-10-15 ENCOUNTER — Ambulatory Visit: Admitting: Family Medicine

## 2023-12-14 ENCOUNTER — Ambulatory Visit: Admitting: Family Medicine

## 2023-12-17 ENCOUNTER — Ambulatory Visit: Admitting: Family Medicine

## 2023-12-21 DIAGNOSIS — J029 Acute pharyngitis, unspecified: Secondary | ICD-10-CM | POA: Diagnosis not present

## 2023-12-21 DIAGNOSIS — J069 Acute upper respiratory infection, unspecified: Secondary | ICD-10-CM | POA: Diagnosis not present

## 2023-12-24 ENCOUNTER — Ambulatory Visit: Admitting: Internal Medicine

## 2024-01-22 DIAGNOSIS — H6121 Impacted cerumen, right ear: Secondary | ICD-10-CM | POA: Diagnosis not present

## 2024-01-22 DIAGNOSIS — H66001 Acute suppurative otitis media without spontaneous rupture of ear drum, right ear: Secondary | ICD-10-CM | POA: Diagnosis not present

## 2024-04-20 DIAGNOSIS — M67814 Other specified disorders of tendon, left shoulder: Secondary | ICD-10-CM | POA: Diagnosis not present

## 2024-04-22 ENCOUNTER — Ambulatory Visit
Admission: EM | Admit: 2024-04-22 | Discharge: 2024-04-22 | Disposition: A | Discharge status: Home / Self Care | Attending: Family Medicine | Admitting: Family Medicine

## 2024-04-22 ENCOUNTER — Other Ambulatory Visit: Payer: Self-pay

## 2024-04-22 DIAGNOSIS — M5441 Lumbago with sciatica, right side: Secondary | ICD-10-CM

## 2024-04-22 MED ORDER — METHYLPREDNISOLONE 4 MG PO TBPK: ORAL_TABLET | ORAL | 0 refills | Status: AC

## 2024-04-22 MED ORDER — LIDOCAINE 5 % EX PTCH: 1.0000 | MEDICATED_PATCH | CUTANEOUS | 0 refills | Status: AC

## 2024-04-22 MED ORDER — CYCLOBENZAPRINE HCL 10 MG PO TABS: 10.0000 mg | ORAL_TABLET | Freq: Every evening | ORAL | 0 refills | Status: AC | PRN

## 2024-04-22 MED ORDER — KETOROLAC TROMETHAMINE 30 MG/ML IJ SOLN
15.0000 mg | Freq: Once | INTRAMUSCULAR | Status: AC
  Administered 2024-04-22: 15 mg via INTRAMUSCULAR

## 2024-04-22 NOTE — Discharge Instructions (Addendum)
 You were given a Toradol  injection in clinic today. Do not take any over the counter NSAID's such as Advil , ibuprofen , Aleve , or naproxen  for 24 hours. You may take tylenol  if needed  You may take Flexeril at night as needed.  Please note this medication will make you drowsy.  Do not drink alcohol or drive while on this medication.  Start Medrol Dosepak as prescribed.  You may also use a Lidoderm  patch to the area by leaving it in place for 12 hours and remove for 12 hours.  I also recommend heat rest and please follow-up with your PCP in 2 to 3 days for recheck.  Please go to the emergency room if you develop any worsening symptoms.  I hope you feel better soon!

## 2024-04-22 NOTE — ED Triage Notes (Signed)
 Pt  states she was moving a mattress at work and got a sharp pain with heat from lower back to mid back while moving mattress. Pt states today it's right lower back pain that radiates down to right knee. Pt states while she was driving she was having tingling in right leg. Pt is in obvious pain

## 2024-04-22 NOTE — ED Provider Notes (Signed)
 [facility]    CSN: [id number] Arrival date & time: 04/22/24  1222      History   Chief Complaint No chief complaint on file.   HPI Megan Rocha is a 33 y.o. female presents for back pain.  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  Since then she has been having a persistent right lower back pain that radiates into her leg.  Denies any numbness or weakness of the legs but does endorse some tingling in the right leg when she was driving but resolved after she got out of the car.  No bowel or bladder incontinence and no saddle paresthesia.  No specific injury such as fall.  No history of back surgeries.  She has taken Tylenol  and ibuprofen  with minimal improvement.  No other concerns at this time  HPI  Past Medical History:  Diagnosis Date   BV (bacterial vaginosis)    Medical history non-contributory    PONV (postoperative nausea and vomiting)    from epidural   UTI (urinary tract infection)    Yeast vaginitis     Patient Active Problem List   Diagnosis Date Noted   Status post bilateral salpingectomy 10/29/2021   Pregnancy 10/28/2021   SVD (spontaneous vaginal delivery) 10/28/2021   Encounter for supervision of low-risk pregnancy in second trimester 06/12/2021   Substance induced mood disorder (HCC) 09/06/2020   Major depressive disorder, recurrent episode, moderate (HCC) 08/01/2020   Generalized anxiety disorder 08/01/2020   Depression affecting pregnancy in third trimester, antepartum 12/14/2019   Benign gestational thrombocytopenia in second trimester 11/17/2019   ASCUS with positive high risk HPV cervical 10/18/2019    Past Surgical History:  Procedure Laterality Date   colposcopy     NO PAST SURGERIES     TUBAL LIGATION N/A 10/29/2021   Procedure: POST PARTUM TUBAL LIGATION;  Surgeon: Barbra Lang PARAS, DO;  Location: [facility];  Service: Gynecology;  Laterality: N/A;     OB History     Gravida  4   Para  2   Term  2   Preterm      AB  1   Living  2      SAB  1   IAB      Ectopic      Multiple  0   Live Births  2            Home Medications    Prior to Admission medications   Medication Sig Start Date End Date Taking? Authorizing Provider  cyclobenzaprine (FLEXERIL) 10 MG tablet Take 1 tablet (10 mg total) by mouth at bedtime as needed for muscle spasms. 04/22/24  Yes Loreda Myla SAUNDERS, NP  lidocaine  (LIDODERM ) 5 % Place 1 patch onto the skin daily. Leave on affected area for 12 hours and remove for 12 hours 04/22/24  Yes Shearon Clonch, Jodi R, NP  methylPREDNISolone (MEDROL DOSEPAK) 4 MG TBPK tablet Take as prescribed on package 04/22/24  Yes Demetrice Combes, Jodi R, NP  naproxen  (NAPROSYN ) 500 MG tablet Take 1 tablet (500 mg total) by mouth 2 (two) times daily as needed for moderate pain (pain score 4-6) or mild pain (pain score 1-3). 04/30/23   Loreda Myla SAUNDERS, NP    Family History Family History  Problem Relation Age of Onset   Healthy Mother    Healthy Father    Breast cancer Paternal Grandmother     Social History  Social History   Tobacco Use   Smoking status: Never   Smokeless tobacco: Never  Vaping Use   Vaping status: Never Used  Substance Use Topics   Alcohol use: Not Currently    Comment: occasional    Drug use: Not Currently    Types: Marijuana    Comment: used daily until + pregnancy     Allergies   Penicillins   Review of Systems Review of Systems  Musculoskeletal:  Positive for back pain.     Physical Exam Triage Vital Signs ED Triage Vitals  Encounter Vitals Group     BP 04/22/24 1244 118/74     Girls Systolic BP Percentile --      Girls Diastolic BP Percentile --      Boys Systolic BP Percentile --      Boys Diastolic BP Percentile --      Pulse Rate 04/22/24 1244 (!) 56     Resp 04/22/24 1244 18     Temp 04/22/24 1244 98.6 F (37 C)     Temp Source 04/22/24 1244 Oral     SpO2 04/22/24 1244 99 %      Weight --      Height --      Head Circumference --      Peak Flow --      Pain Score 04/22/24 1242 7     Pain Loc --      Pain Education --      Exclude from Growth Chart --    No data found.  Updated Vital Signs BP 118/74   Pulse (!) 56   Temp 98.6 F (37 C) (Oral)   Resp 18   LMP 04/19/2024   SpO2 99%   Visual Acuity Right Eye Distance:   Left Eye Distance:   Bilateral Distance:    Right Eye Near:   Left Eye Near:    Bilateral Near:     Physical Exam Vitals and nursing note reviewed.  Constitutional:      General: She is not in acute distress.    Appearance: Normal appearance. She is not ill-appearing.  HENT:     Head: Normocephalic and atraumatic.  Eyes:     Pupils: Pupils are equal, round, and reactive to light.  Cardiovascular:     Rate and Rhythm: Bradycardia present.  Pulmonary:     Effort: Pulmonary effort is normal.  Musculoskeletal:     Thoracic back: Normal.     Lumbar back: Spasms and tenderness present. No swelling, edema, deformity, signs of trauma, lacerations or bony tenderness. Decreased range of motion. No scoliosis.       Back:     Comments: Patient unable to lay flat for straight leg raise due to pain.  Strength is 5 out of 5 bilateral lower extremity  Skin:    General: Skin is warm and dry.  Neurological:     General: No focal deficit present.     Mental Status: She is alert and oriented to person, place, and time.     Deep Tendon Reflexes:     Reflex Scores:      Patellar reflexes are 2+ on the right side and 2+ on the left side. Psychiatric:        Mood and Affect: Mood normal.        Behavior: Behavior normal.      UC Treatments / Results  Labs (all labs ordered are listed, but only abnormal results are displayed) Labs Reviewed - No  data to display  EKG   Radiology No results found.  Procedures Procedures (including critical care time)  Medications Ordered in UC Medications  ketorolac  (TORADOL ) 30 MG/ML  injection 15 mg (15 mg Intramuscular Given 04/22/24 1301)    Initial Impression / Assessment and Plan / UC Course  I have reviewed the triage vital signs and the nursing notes.  Pertinent labs & imaging results that were available during my care of the patient were reviewed by me and considered in my medical decision making (see chart for details).     Reviewed exam and symptoms with patient.  Discussed low back strain/sciatica.  Patient given Toradol  injection in clinic.  Monitored for 10 minutes after injection with no reaction noted and tolerated well.  She was instructed no NSAIDs for 24 hours and verbalized understanding.  Will do trial of Flexeril nightly as needed.  Will do Medrol Dosepak and Lidoderm  patches as needed.  Discussed heat, rest and PCP follow-up in 2 to 3 days for recheck.  ER precautions reviewed and patient verbalized understanding. Final Clinical Impressions(s) / UC Diagnoses   Final diagnoses:  Acute right-sided low back pain with right-sided sciatica     Discharge Instructions      You were given a Toradol  injection in clinic today. Do not take any over the counter NSAID's such as Advil , ibuprofen , Aleve , or naproxen  for 24 hours. You may take tylenol  if needed  You may take Flexeril at night as needed.  Please note this medication will make you drowsy.  Do not drink alcohol or drive while on this medication.  Start Medrol Dosepak as prescribed.  You may also use a Lidoderm  patch to the area by leaving it in place for 12 hours and remove for 12 hours.  I also recommend heat rest and please follow-up with your PCP in 2 to 3 days for recheck.  Please go to the emergency room if you develop any worsening symptoms.  I hope you feel better soon!      ED Prescriptions     Medication Sig Dispense Auth. Provider   cyclobenzaprine (FLEXERIL) 10 MG tablet Take 1 tablet (10 mg total) by mouth at bedtime as needed for muscle spasms. 5 tablet Carliss Porcaro, Jodi R, NP    methylPREDNISolone (MEDROL DOSEPAK) 4 MG TBPK tablet Take as prescribed on package 21 tablet Demitria Hay, Jodi R, NP   lidocaine  (LIDODERM ) 5 % Place 1 patch onto the skin daily. Leave on affected area for 12 hours and remove for 12 hours 10 patch Damontre Millea, Jodi R, NP      PDMP not reviewed this encounter.   Loreda Myla SAUNDERS, NP 04/22/24 [phone]

## 2024-04-28 DIAGNOSIS — M9914 Subluxation complex (vertebral) of sacral region: Secondary | ICD-10-CM | POA: Diagnosis not present

## 2024-04-28 DIAGNOSIS — M9911 Subluxation complex (vertebral) of cervical region: Secondary | ICD-10-CM | POA: Diagnosis not present

## 2024-04-28 DIAGNOSIS — M9913 Subluxation complex (vertebral) of lumbar region: Secondary | ICD-10-CM | POA: Diagnosis not present

## 2024-04-28 DIAGNOSIS — M9912 Subluxation complex (vertebral) of thoracic region: Secondary | ICD-10-CM | POA: Diagnosis not present

## 2024-05-03 DIAGNOSIS — M9912 Subluxation complex (vertebral) of thoracic region: Secondary | ICD-10-CM | POA: Diagnosis not present

## 2024-05-03 DIAGNOSIS — M9913 Subluxation complex (vertebral) of lumbar region: Secondary | ICD-10-CM | POA: Diagnosis not present

## 2024-05-03 DIAGNOSIS — M9914 Subluxation complex (vertebral) of sacral region: Secondary | ICD-10-CM | POA: Diagnosis not present

## 2024-05-03 DIAGNOSIS — M9911 Subluxation complex (vertebral) of cervical region: Secondary | ICD-10-CM | POA: Diagnosis not present
